# Patient Record
Sex: Male | Born: 1935
Health system: Southern US, Community
[De-identification: ages and names within clinical notes are randomized; demographics above are authoritative.]

## PROBLEM LIST (undated history)

## (undated) DIAGNOSIS — M109 Gout, unspecified: Secondary | ICD-10-CM

## (undated) DIAGNOSIS — I10 Essential (primary) hypertension: Secondary | ICD-10-CM

## (undated) DIAGNOSIS — E785 Hyperlipidemia, unspecified: Secondary | ICD-10-CM

## (undated) HISTORY — DX: Essential (primary) hypertension: I10

## (undated) HISTORY — DX: Gout, unspecified: M10.9

## (undated) HISTORY — DX: Hyperlipidemia, unspecified: E78.5

## (undated) HISTORY — PX: NO PAST SURGERIES: SHX2092

---

## 2015-02-06 DIAGNOSIS — J189 Pneumonia, unspecified organism: Secondary | ICD-10-CM | POA: Insufficient documentation

## 2015-02-06 DIAGNOSIS — N179 Acute kidney failure, unspecified: Secondary | ICD-10-CM | POA: Insufficient documentation

## 2016-09-28 DIAGNOSIS — N183 Chronic kidney disease, stage 3 unspecified: Secondary | ICD-10-CM | POA: Insufficient documentation

## 2016-09-28 DIAGNOSIS — T24232A Burn of second degree of left lower leg, initial encounter: Secondary | ICD-10-CM | POA: Insufficient documentation

## 2016-09-28 DIAGNOSIS — N179 Acute kidney failure, unspecified: Secondary | ICD-10-CM | POA: Insufficient documentation

## 2016-09-28 DIAGNOSIS — T24212A Burn of second degree of left thigh, initial encounter: Secondary | ICD-10-CM | POA: Insufficient documentation

## 2017-11-23 ENCOUNTER — Ambulatory Visit (INDEPENDENT_AMBULATORY_CARE_PROVIDER_SITE_OTHER): Payer: Medicare Other | Admitting: Family Medicine

## 2017-11-23 ENCOUNTER — Other Ambulatory Visit: Payer: Self-pay | Admitting: Family Medicine

## 2017-11-23 ENCOUNTER — Encounter: Payer: Self-pay | Admitting: Family Medicine

## 2017-11-23 VITALS — BP 123/62 | HR 62 | Temp 98.6°F | Resp 16 | Ht 67.0 in | Wt 236.0 lb

## 2017-11-23 DIAGNOSIS — E785 Hyperlipidemia, unspecified: Secondary | ICD-10-CM | POA: Diagnosis not present

## 2017-11-23 DIAGNOSIS — I10 Essential (primary) hypertension: Secondary | ICD-10-CM

## 2017-11-23 DIAGNOSIS — M1A0610 Idiopathic chronic gout, right knee, without tophus (tophi): Secondary | ICD-10-CM | POA: Diagnosis not present

## 2017-11-23 DIAGNOSIS — N183 Chronic kidney disease, stage 3 unspecified: Secondary | ICD-10-CM | POA: Insufficient documentation

## 2017-11-23 DIAGNOSIS — Z7689 Persons encountering health services in other specified circumstances: Secondary | ICD-10-CM

## 2017-11-23 DIAGNOSIS — Z0000 Encounter for general adult medical examination without abnormal findings: Secondary | ICD-10-CM

## 2017-11-23 DIAGNOSIS — I129 Hypertensive chronic kidney disease with stage 1 through stage 4 chronic kidney disease, or unspecified chronic kidney disease: Secondary | ICD-10-CM | POA: Insufficient documentation

## 2017-11-23 DIAGNOSIS — M109 Gout, unspecified: Secondary | ICD-10-CM | POA: Insufficient documentation

## 2017-11-23 DIAGNOSIS — R351 Nocturia: Secondary | ICD-10-CM

## 2017-11-23 NOTE — Assessment & Plan Note (Signed)
Well-controlled HTN - Home BP readings none available  No known complications    Plan:  1. Continue current BP regimen - Lisinopril 40mg , Atenolol 50mg , Amlodipine 5mg  daily 2. Encourage improved lifestyle - low sodium diet, regular exercise as tolerated 3. Continue monitor BP outside office, bring readings to next visit, if persistently >140/90 or new symptoms notify office sooner 4. Follow-up in 3 months annual + labs, review records  Future consider switch ACEi to ARB to reduce gout flare risk w/ losartan

## 2017-11-23 NOTE — Progress Notes (Signed)
Subjective:    Patient ID: Robert [name], male    DOB: 12/23/1931, 84 y.o.   MRN: [id number]  Robert [name] is a 84 y.o. male presenting on 11/23/2017 for Establish Care (knee pain stiff painful ROM Right is worst )  Previous PCP at [facility] for 3-4 years previously, then saw [facility] in [place] but then not covered w/ insurance and now here to establish care w/ new PCP   HPI   CHRONIC HTN: Reports no concerns, has BP checked at home occasionally, cousin checks it Current Meds - Atenolol 50mg , Amlodpine 5mg , Lisinopril 40mg    Reports good compliance, took meds today. Tolerating well, w/o complaints.  Bilateral Gout Arthritis - Chronic problem for years, >5-10 years possibly, gout in both knees, R worse than Left knee. He has some chronic pain but is able to tolerate it and stay active. He is retired from work as Scientist, forensic and work in [employer] He does not use a cane or walker for assistance, but admits has fallen x 2 in past 1-2 years. He can tolerate NSAIDs PRN. Has not had recent gout flare. Tolerating Allopurinol 300mg  daily with good results of preventing flares. He has not had any x-rays of R knee or either knee by his report. Difficulty in finding knee brace or sleeve, tried in past but had problems with compression  PMH - Hyperlipidemia, Obesity (Morbid based on risk factors)  Do not have any recent lab results available.  Health Maintenance: Due for Tdap and Pneumonia vaccine series, unless already received at prior PCP, request record  Depression screen [facility] 2/9 11/23/2017  Decreased Interest 0  Down, Depressed, Hopeless 0  PHQ - 2 Score 0    Past Medical History:  Diagnosis Date  . Gout   . Hyperlipidemia   . Hypertension    History reviewed. No pertinent surgical history. Social History   Socioeconomic History  . Marital status: Widowed    Spouse name: Not on file  . Number of children: Not on file  . Years of education: Not on file  . Highest education  level: Not on file  Occupational History  . Not on file  Social Needs  . Financial resource strain: Not on file  . Food insecurity:    Worry: Not on file    Inability: Not on file  . Transportation needs:    Medical: Not on file    Non-medical: Not on file  Tobacco Use  . Smoking status: Former Research scientist (life sciences)  . Smokeless tobacco: Former Network engineer and Sexual Activity  . Alcohol use: Never    Frequency: Never  . Drug use: Never  . Sexual activity: Not on file  Lifestyle  . Physical activity:    Days per week: Not on file    Minutes per session: Not on file  . Stress: Not on file  Relationships  . Social connections:    Talks on phone: Not on file    Gets together: Not on file    Attends religious service: Not on file    Active member of club or organization: Not on file    Attends meetings of clubs or organizations: Not on file    Relationship status: Not on file  . Intimate partner violence:    Fear of current or ex partner: Not on file    Emotionally abused: Not on file    Physically abused: Not on file    Forced sexual activity: Not on file  Other  Topics Concern  . Not on file  Social History Narrative  . Not on file   History reviewed. No pertinent family history. Current Outpatient Medications on File Prior to Visit  Medication Sig  . allopurinol (ZYLOPRIM) 300 MG tablet Take 300 mg by mouth daily.  Marland Kitchen amLODipine (NORVASC) 5 MG tablet Take 5 mg by mouth daily.  Marland Kitchen atenolol (TENORMIN) 50 MG tablet Take 50 mg by mouth daily.  Marland Kitchen lisinopril (PRINIVIL,ZESTRIL) 40 MG tablet Take 40 mg by mouth daily.   No current facility-administered medications on file prior to visit.     Review of Systems  Constitutional: Negative for activity change, appetite change, chills, diaphoresis, fatigue and fever.  HENT: Negative for congestion and hearing loss.   Eyes: Negative for visual disturbance.  Respiratory: Negative for apnea, cough, choking, chest tightness, shortness of  breath and wheezing.   Cardiovascular: Negative for chest pain, palpitations and leg swelling.  Gastrointestinal: Negative for abdominal pain, anal bleeding, blood in stool, constipation, diarrhea, nausea and vomiting.  Endocrine: Negative for cold intolerance.  Genitourinary: Negative for decreased urine volume, difficulty urinating, dysuria, frequency, hematuria, testicular pain and urgency.  Musculoskeletal: Positive for arthralgias (R knee). Negative for neck pain.  Skin: Negative for rash.  Allergic/Immunologic: Negative for environmental allergies.  Neurological: Negative for dizziness, weakness, light-headedness, numbness and headaches.  Hematological: Negative for adenopathy.  Psychiatric/Behavioral: Negative for behavioral problems, dysphoric mood and sleep disturbance. The patient is not nervous/anxious.    Per HPI unless specifically indicated above     Objective:    BP 123/62   Pulse 62   Temp 98.6 F (37 C) (Oral)   Resp 16   Ht 5\' 7"  (1.702 m)   Wt 236 lb (107 kg)   BMI 36.96 kg/m   Wt Readings from Last 3 Encounters:  11/23/17 236 lb (107 kg)    Physical Exam  Constitutional: He is oriented to person, place, and time. He appears well-developed and well-nourished. No distress.  Well-appearing, comfortable, cooperative, obese  HENT:  Head: Normocephalic and atraumatic.  Mouth/Throat: Oropharynx is clear and moist.  Eyes: Conjunctivae are normal. Right eye exhibits no discharge. Left eye exhibits no discharge.  Neck: Normal range of motion. Neck supple. No thyromegaly present.  Cardiovascular: Normal rate, regular rhythm, normal heart sounds and intact distal pulses.  No murmur heard. Pulmonary/Chest: Effort normal and breath sounds normal. No respiratory distress. He has no wheezes. He has no rales.  Musculoskeletal: He exhibits no edema.  Bilateral Knees Inspection: Mild bulky appearance R>L. No ecchymosis or effusion. Palpation: Non-tender. Significant  crepitus R>L ROM: Slightly limited active ROM R flex/ext Strength: 5/5 intact knee flex/ext, ankle dorsi/plantarflex Neurovascular: distally intact sensation light touch and pulses  Lymphadenopathy:    He has no cervical adenopathy.  Neurological: He is alert and oriented to person, place, and time.  Skin: Skin is warm and dry. No rash noted. He is not diaphoretic. No erythema.  Psychiatric: He has a normal mood and affect. His behavior is normal.  Well groomed, good eye contact, normal speech and thoughts  Nursing note and vitals reviewed.  No results found for this or any previous visit.    Assessment & Plan:   Problem List Items Addressed This Visit    Gout    Stable w/o acute flare. Seems complicated by Chronic Gouty Arthritis with chronic changes of knees. Limit his ambulation at times due to chronic pain and stiff Known chronic gout in R knee > L - Currently  on Allopurinol 300mg  daily - Last uric acid level not available  Plan: 1. Continue Allopurinol 300mg  daily for prophylaxis - refill - Avoid food triggers (red meat, alcohol) - Follow-up as planned 3 months labs include uric acid level, future may need x-rays for baseline, if flare return precautions, consider NSAID vs Prednisone  Completed handicap placard form for permanent - up to 5 years, cannot walk up to 200 ft w/o rest, and limited by orthopedic condition      Hyperlipidemia    Due for lipids at next visit Not on statin therapy currently Review based on ASCVD risk calc      Relevant Medications   lisinopril (PRINIVIL,ZESTRIL) 40 MG tablet   atenolol (TENORMIN) 50 MG tablet   amLODipine (NORVASC) 5 MG tablet   Hypertension - Primary    Well-controlled HTN - Home BP readings none available  No known complications    Plan:  1. Continue current BP regimen - Lisinopril 40mg , Atenolol 50mg , Amlodipine 5mg  daily 2. Encourage improved lifestyle - low sodium diet, regular exercise as tolerated 3. Continue  monitor BP outside office, bring readings to next visit, if persistently >140/90 or new symptoms notify office sooner 4. Follow-up in 3 months annual + labs, review records  Future consider switch ACEi to ARB to reduce gout flare risk w/ losartan      Relevant Medications   lisinopril (PRINIVIL,ZESTRIL) 40 MG tablet   atenolol (TENORMIN) 50 MG tablet   amLODipine (NORVASC) 5 MG tablet   Morbid obesity ([place])    Considered morbid obesity w/ BMI >36 (not >40) due to co morbid conditions/risk factors with Hyperlipidemia, Hypertension and chronic gout  Counseling on lifestyle, diet exercise improve goal wt loss       Other Visit Diagnoses    Encounter to establish care with new doctor       Request records from prior PCP [facility]      No orders of the defined types were placed in this encounter.  Follow up plan: Return in about 3 months (around 02/22/2018) for Annual Physical.  Future labs ordered for 02/28/18  Nobie Putnam, [facility] 11/23/2017, 10:22 PM

## 2017-11-23 NOTE — Assessment & Plan Note (Signed)
Due for lipids at next visit Not on statin therapy currently Review based on ASCVD risk calc

## 2017-11-23 NOTE — Assessment & Plan Note (Signed)
Considered morbid obesity w/ BMI >36 (not >40) due to co morbid conditions/risk factors with Hyperlipidemia, Hypertension and chronic gout  Counseling on lifestyle, diet exercise improve goal wt loss 

## 2017-11-23 NOTE — Assessment & Plan Note (Addendum)
Stable w/o acute flare. Seems complicated by Chronic Gouty Arthritis with chronic changes of knees. Limit his ambulation at times due to chronic pain and stiff Known chronic gout in R knee > L - Currently on Allopurinol 300mg  daily - Last uric acid level not available  Plan: 1. Continue Allopurinol 300mg  daily for prophylaxis - refill - Avoid food triggers (red meat, alcohol) - Follow-up as planned 3 months labs include uric acid level, future may need x-rays for baseline, if flare return precautions, consider NSAID vs Prednisone  Completed handicap placard form for permanent - up to 5 years, cannot walk up to 200 ft w/o rest, and limited by orthopedic condition

## 2017-11-23 NOTE — Patient Instructions (Addendum)
Thank you for coming to the office today.  Keep taking current BP medications, Bp is good today well controlled.  Continue Allopurinol for gout.  Recommend to start taking Tylenol Extra Strength 500mg  tabs - take 1 to 2 tabs per dose (max 1000mg ) every 6-8 hours for pain (take regularly, don't skip a dose for next 7 days), max 24 hour daily dose is 6 tablets or 3000mg . In the future you can repeat the same everyday Tylenol course for 1-2 weeks at a time.   Only use anti inflammatory Advil or Ibuprofen or Aleve if flare -up  ------------------------------------------  [facility] [place], [place] [address] [address] [place] [address] [place] Phone: [phone]  [place], [place] [address] Ph: [phone]  DUE for [place] (no food or drink after midnight before the lab appointment, only water or coffee without cream/sugar on the morning of)  SCHEDULE "Lab Only" visit in the morning at the clinic for lab draw in 3 MONTHS   - Make sure Lab Only appointment is at about 1 week before your next appointment, so that results will be available  For Lab Results, once available within 2-3 days of blood draw, you can can log in to MyChart online to view your results and a brief explanation. Also, we can discuss results at next follow-up visit.   Please schedule a Follow-up Appointment to: Return in about 3 months (around 02/22/2018) for Annual Physical.  If you have any other questions or concerns, please feel free to call the office or send a message through [place]. You may also schedule an earlier appointment if necessary.  Additionally, you may be receiving a survey about your experience at our office within a few days to 1 week by e-mail or mail. We value your feedback.  Nobie Putnam, DO [place]

## 2018-01-26 DIAGNOSIS — R0982 Postnasal drip: Secondary | ICD-10-CM | POA: Diagnosis not present

## 2018-01-26 DIAGNOSIS — J069 Acute upper respiratory infection, unspecified: Secondary | ICD-10-CM | POA: Diagnosis not present

## 2018-02-11 ENCOUNTER — Ambulatory Visit (INDEPENDENT_AMBULATORY_CARE_PROVIDER_SITE_OTHER): Payer: Medicare Other | Admitting: Family Medicine

## 2018-02-11 ENCOUNTER — Ambulatory Visit: Payer: Self-pay | Admitting: Family Medicine

## 2018-02-11 ENCOUNTER — Encounter: Payer: Self-pay | Admitting: Family Medicine

## 2018-02-11 VITALS — BP 129/88 | HR 70 | Temp 98.3°F | Resp 16 | Ht 67.0 in | Wt 226.0 lb

## 2018-02-11 DIAGNOSIS — J209 Acute bronchitis, unspecified: Secondary | ICD-10-CM

## 2018-02-11 MED ORDER — LEVOFLOXACIN 500 MG PO TABS: 500.0000 mg | ORAL_TABLET | Freq: Every day | ORAL | 0 refills | Status: DC

## 2018-02-11 NOTE — Progress Notes (Signed)
Subjective:    Patient ID: Robert [name], male    DOB: 11/04/1931, 84 y.o.   MRN: [id number]  Robert [name] is a 84 y.o. male presenting on 02/11/2018 for Cough (onset 2 week chest congestion)  Patient presents for a same day appointment. History provided by patient and granddaughter.  HPI   COUGH / Bronchitis Reports symptoms started with more sinus symptoms and URI and then progressive worsening over 2 weeks after onset. Now with concern with cough not improved. He was seen at local Urgent Care 1 week ago given Augmentin and Tessalon without improvement. He is a former smoker but no history of COPD or asthma. Has not been on prednisone or albuterol before for breathing. He said Robert [name] is making cough more. - Taking some Mucinex OTC - Sick contact with brother having similar symptoms was dx with Pneumonia - Admits persistent productive cough, sometimes too thick mucus to and unable to get it cleared - Admits occasional short of breath due to coughing, worse at night - Denies fevers chills sweats, chest pain, nausea vomiting rash, headache, dizziness lightheaded   Depression screen [facility] 2/9 02/11/2018 11/23/2017  Decreased Interest 0 0  Down, Depressed, Hopeless 0 0  PHQ - 2 Score 0 0    Social History   Tobacco Use  . Smoking status: Former Research scientist (life sciences)  . Smokeless tobacco: Former Network engineer Use Topics  . Alcohol use: Never    Frequency: Never  . Drug use: Never    Review of Systems Per HPI unless specifically indicated above     Objective:    BP 129/88   Pulse 70   Temp 98.3 F (36.8 C) (Oral)   Resp 16   Ht 5\' 7"  (1.702 m)   Wt 226 lb (102.5 kg)   SpO2 98%   BMI 35.40 kg/m   Wt Readings from Last 3 Encounters:  02/11/18 226 lb (102.5 kg)  11/23/17 236 lb (107 kg)    Physical Exam  Constitutional: He is oriented to person, place, and time. He appears well-developed and well-nourished. No distress.  Well-appearing, comfortable, cooperative  HENT:  Head:  Normocephalic and atraumatic.  Mouth/Throat: Oropharynx is clear and moist.  Frontal / maxillary sinuses non-tender. Nares patent without purulence or edema. Bilateral TMs clear without erythema, effusion or bulging. Oropharynx clear without erythema, exudates, edema or asymmetry.  Eyes: Conjunctivae are normal. Right eye exhibits no discharge. Left eye exhibits no discharge.  Neck: Neck supple.  Cardiovascular: Normal rate, regular rhythm, normal heart sounds and intact distal pulses.  No murmur heard. Pulmonary/Chest: Effort normal. No respiratory distress. He has no wheezes. He has no rales.  Occasional cough. Good air movement overall, some slight reduced at bases. Non focal no crackles or wheezing.  Musculoskeletal: Normal range of motion. He exhibits no edema.  Neurological: He is alert and oriented to person, place, and time.  Skin: Skin is warm and dry. No rash noted. He is not diaphoretic. No erythema.  Psychiatric: He has a normal mood and affect. His behavior is normal.  Well groomed, good eye contact, normal speech and thoughts  Nursing note and vitals reviewed.  No results found for this or any previous visit.    Assessment & Plan:   Problem List Items Addressed This Visit    None    Visit Diagnoses    Acute bronchitis with bronchospasm    -  Primary   Relevant Medications   levofloxacin (LEVAQUIN) 500 MG tablet  Consistent with worsening bronchitis in setting of likely viral URI (+sick contacts) Concern with duration >2 week - Afebrile, no focal signs of infection (not consistent with pneumonia by history or exam), with mixed sinus symptoms but not characteristic of bacterial sinusitis at this time. - POx 98% on RA - Concern some reduced air movement and night-time cough (without history of COPD, but former smoker)  [employer] to get name of recent antibiotic, he did not bring and we did not have record - s/p Augmentin course  Plan: 1. Offered chest x-ray  - mutual agreement to defer for now - can return within 2-3 days if not improved for Chest X-ray - Start empiric Levaquin 500mg  daily x 7 day antibiotic - Future may add Flonase or nasal spray - May finish Tessalon if helping - May try OTC Mucinex (or may try Mucinex-DM for cough) up to 7-10 days then stop - sample given Return criteria reviewed, follow-up within 1 week if not improved - would offer CXR, Prednisone   Meds ordered this encounter  Medications  . levofloxacin (LEVAQUIN) 500 MG tablet    Sig: Take 1 tablet (500 mg total) by mouth daily. For 7 days    Dispense:  7 tablet    Refill:  0    Follow up plan: Return in about 1 week (around 02/18/2018), or if symptoms worsen or fail to improve, for bronchitis / cough.  Robert [name], [facility] 02/11/2018, 11:59 AM

## 2018-02-11 NOTE — Patient Instructions (Addendum)
Thank you for coming to the office today.  1. It sounds like you had an Upper Respiratory Virus that has settled into a Bronchitis, lower respiratory tract infection, and think that this should gradually improve. Once you are feeling better, the cough may take a few weeks to fully resolve. Possibly to have a deeper infection or pneumonia we offered Chest X-ray but this may be done if not better within next 2-3 days.  Start taking Levaquin antibiotic 500mg  daily x 7 days  If not improved we will offer Chest X-ray and Prednisone for cough  Continue OTC Mucinex (or may try Mucinex-DM for cough) up to 7-10 days then stop  - Use nasal saline (Simply Saline or Ocean Spray) to flush nasal congestion multiple times a day, may help cough - Drink plenty of fluids to improve congestion  If your symptoms seem to worsen instead of improve over next several days, including significant fever / chills, worsening shortness of breath, worsening wheezing, or nausea / vomiting and can't take medicines - return sooner or go to hospital Emergency Department for more immediate treatment.   Please schedule a Follow-up Appointment to: Return in about 1 week (around 02/18/2018), or if symptoms worsen or fail to improve, for bronchitis / cough.  If you have any other questions or concerns, please feel free to call the office or send a message through [place]. You may also schedule an earlier appointment if necessary.  Additionally, you may be receiving a survey about your experience at our office within a few days to 1 week by e-mail or mail. We value your feedback.  Nobie Putnam, DO [place]

## 2018-02-22 ENCOUNTER — Ambulatory Visit
Admission: RE | Admit: 2018-02-22 | Discharge: 2018-02-22 | Disposition: A | Discharge status: Home / Self Care | Payer: Medicare Other | Source: Ambulatory Visit | Attending: Family Medicine | Admitting: Family Medicine

## 2018-02-22 ENCOUNTER — Encounter: Payer: Self-pay | Admitting: Family Medicine

## 2018-02-22 ENCOUNTER — Other Ambulatory Visit: Payer: Self-pay

## 2018-02-22 ENCOUNTER — Ambulatory Visit (INDEPENDENT_AMBULATORY_CARE_PROVIDER_SITE_OTHER): Payer: Medicare Other | Admitting: Family Medicine

## 2018-02-22 VITALS — BP 114/67 | HR 65 | Temp 98.3°F | Resp 16 | Ht 67.0 in | Wt 223.2 lb

## 2018-02-22 DIAGNOSIS — J984 Other disorders of lung: Secondary | ICD-10-CM | POA: Diagnosis not present

## 2018-02-22 DIAGNOSIS — I700 Atherosclerosis of aorta: Secondary | ICD-10-CM | POA: Insufficient documentation

## 2018-02-22 DIAGNOSIS — J209 Acute bronchitis, unspecified: Secondary | ICD-10-CM

## 2018-02-22 DIAGNOSIS — J00 Acute nasopharyngitis [common cold]: Secondary | ICD-10-CM

## 2018-02-22 DIAGNOSIS — R05 Cough: Secondary | ICD-10-CM | POA: Diagnosis not present

## 2018-02-22 MED ORDER — FLUTICASONE PROPIONATE 50 MCG/ACT NA SUSP: 2.0000 | Freq: Every day | NASAL | 3 refills | Status: DC

## 2018-02-22 NOTE — Patient Instructions (Addendum)
Thank you for coming to the office today.  Chest X-ray today - will let you know by end of day or first thing Monday with results.  Most likely still lingering cough from sinus drainage or other bronchitis - however no more antibiotics at this time.  Start nasal steroid Flonase 2 sprays in each nostril daily for 4-6 weeks, may repeat course seasonally or as needed  Please schedule a Follow-up Appointment to: Return if symptoms worsen or fail to improve, for cough - keep apt as scheduled next 1-2 weeks.  If you have any other questions or concerns, please feel free to call the office or send a message through [place]. You may also schedule an earlier appointment if necessary.  Additionally, you may be receiving a survey about your experience at our office within a few days to 1 week by e-mail or mail. We value your feedback.  Nobie Putnam, DO [place]

## 2018-02-22 NOTE — Progress Notes (Signed)
Subjective:    Patient ID: Robert [name], male    DOB: [birth year], 84 y.o.   MRN: [id number]  Robert [name] is a 84 y.o. male presenting on 02/22/2018 for Cough (persistent cough. Pt recently treated for bronchitis x 11 days ago )   Patient presents for a same day appointment. History provided by patient and granddaughter.   HPI  FOLLOW-UP Bronchitis - Lingering Cough - Last visit with me 02/11/18, for initial visit for same problem with cough, treated with empiric Levaquin 500mg  daily x 7 days given history of concern for pneumonia in the past but he was not consistent with this at the presentation, rx also tessalon, mucinex, CXR was deferred at that time, see prior notes for background information. - Interval update with significant improvement on antibiotic and overall felt much better, but has had lingering cough still, now 10 days after initial treatment - Today patient reports he feels occasional episodes of coughing, worse with outdoor exposure and hot weather, sometimes if he is active will have cough, occasionally productive otherwise not always. He tried tessalon but it did not help. He did not start Flonase or other nose spray. - Finished Mucinex - History of lisinopril 40mg  daily, no known dry cough in past due to ACEi. Does not seem consistent. - Denies any dyspnea, fevers chills sweats, chest pain, nausea vomiting rash, headache, dizziness lightheaded   Depression screen [facility] 2/9 02/11/2018 11/23/2017  Decreased Interest 0 0  Down, Depressed, Hopeless 0 0  PHQ - 2 Score 0 0    Social History   Tobacco Use  . Smoking status: Former Research scientist (life sciences)  . Smokeless tobacco: Former Network engineer Use Topics  . Alcohol use: Never    Frequency: Never  . Drug use: Never    Review of Systems Per HPI unless specifically indicated above     Objective:    BP 114/67 (BP Location: Right Arm, Patient Position: Sitting, Cuff Size: Normal)   Pulse 65   Temp 98.3 F (36.8 C) (Oral)   Resp  16   Ht 5\' 7"  (1.702 m)   Wt 223 lb 3.2 oz (101.2 kg)   SpO2 96%   BMI 34.96 kg/m   Wt Readings from Last 3 Encounters:  02/22/18 223 lb 3.2 oz (101.2 kg)  02/11/18 226 lb (102.5 kg)  11/23/17 236 lb (107 kg)    Physical Exam  Constitutional: He is oriented to person, place, and time. He appears well-developed and well-nourished. No distress.  Well-appearing, comfortable, cooperative  HENT:  Head: Normocephalic and atraumatic.  Mouth/Throat: Oropharynx is clear and moist.  Frontal / maxillary sinuses non-tender. Oropharynx clear without erythema, exudates, edema or asymmetry.  Eyes: Conjunctivae are normal. Right eye exhibits no discharge. Left eye exhibits no discharge.  Neck: Neck supple.  Cardiovascular: Normal rate, regular rhythm, normal heart sounds and intact distal pulses.  No murmur heard. Pulmonary/Chest: Effort normal. No respiratory distress. He has no wheezes. He has no rales.  No obvious coughing during visit. Good air movement overall, still some slightly diminished air movement R > L lung base with mild rhonchi that clears somewhat but not completely with cough. No focal no crackles or wheezing.  Musculoskeletal: Normal range of motion. He exhibits no edema.  Neurological: He is alert and oriented to person, place, and time.  Skin: Skin is warm and dry. No rash noted. He is not diaphoretic. No erythema.  Psychiatric: He has a normal mood and affect. His behavior is normal.  Well  groomed, good eye contact, normal speech and thoughts  Nursing note and vitals reviewed.  No results found for this or any previous visit.    Assessment & Plan:   Problem List Items Addressed This Visit    None    Visit Diagnoses    Acute bronchitis with bronchospasm    -  Primary Now significantly improved, with resolved bronchospasm and bronchitis Clinically seems to be more post cough syndrome may be related to allergic rhinitis now or may just take longer to resolve on it's own  after treatment - Patient and family are still requesting chest x-ray, I would support this given still lower L lung field with some rhonchi and slight reduced air movement  Plan No further antibiotics. Stop Tessalon Check Chest X-ray routine order today - will f/u results with patient later today or Monday as advised Start nasal steroid Flonase 2 sprays in each nostril daily for 4-6 weeks, may repeat course seasonally or as needed - Follow-up as planned in 1-2 weeks for annual and labs, will determine if improving at that time  Future may consider to Switch Lisinopril if we think ACEi cough is potential    Relevant Orders   DG Chest 2 View   Acute rhinitis       Relevant Medications   fluticasone (FLONASE) 50 MCG/ACT nasal spray         Meds ordered this encounter  Medications  . fluticasone (FLONASE) 50 MCG/ACT nasal spray    Sig: Place 2 sprays into both nostrils daily. Use for 4-6 weeks then stop and use seasonally or as needed.    Dispense:  16 g    Refill:  3    Follow up plan: Return if symptoms worsen or fail to improve, for cough - keep apt as scheduled next 1-2 weeks.  Future orders were already in system for upcoming lab and physical visits.  Nobie Putnam, [facility] 02/22/2018, 3:11 PM

## 2018-02-25 ENCOUNTER — Telehealth: Payer: Self-pay

## 2018-02-27 NOTE — Telephone Encounter (Signed)
Attempted to contact the pt, no answer. LMOM to return my call.  

## 2018-02-28 ENCOUNTER — Other Ambulatory Visit: Payer: Medicare Other

## 2018-02-28 DIAGNOSIS — R351 Nocturia: Secondary | ICD-10-CM

## 2018-02-28 DIAGNOSIS — R7303 Prediabetes: Secondary | ICD-10-CM | POA: Diagnosis not present

## 2018-02-28 DIAGNOSIS — I10 Essential (primary) hypertension: Secondary | ICD-10-CM | POA: Diagnosis not present

## 2018-02-28 DIAGNOSIS — Z0000 Encounter for general adult medical examination without abnormal findings: Secondary | ICD-10-CM

## 2018-02-28 DIAGNOSIS — E785 Hyperlipidemia, unspecified: Secondary | ICD-10-CM | POA: Diagnosis not present

## 2018-02-28 DIAGNOSIS — M1A0610 Idiopathic chronic gout, right knee, without tophus (tophi): Secondary | ICD-10-CM

## 2018-03-01 ENCOUNTER — Encounter: Payer: Self-pay | Admitting: Family Medicine

## 2018-03-01 DIAGNOSIS — N183 Chronic kidney disease, stage 3 unspecified: Secondary | ICD-10-CM | POA: Insufficient documentation

## 2018-03-01 DIAGNOSIS — R7303 Prediabetes: Secondary | ICD-10-CM | POA: Insufficient documentation

## 2018-03-01 LAB — CBC WITH DIFFERENTIAL/PLATELET
Basophils Absolute: 129 cells/uL (ref 0–200)
Basophils Relative: 0.8 %
Eosinophils Absolute: 354 cells/uL (ref 15–500)
Eosinophils Relative: 2.2 %
HCT: 40 % (ref 38.5–50.0)
Hemoglobin: 12.7 g/dL — ABNORMAL LOW (ref 13.2–17.1)
Lymphs Abs: 9563 cells/uL — ABNORMAL HIGH (ref 850–3900)
MCH: 23 pg — ABNORMAL LOW (ref 27.0–33.0)
MCHC: 31.8 g/dL — ABNORMAL LOW (ref 32.0–36.0)
MCV: 72.5 fL — ABNORMAL LOW (ref 80.0–100.0)
Monocytes Relative: 11 %
Neutro Abs: 4283 cells/uL (ref 1500–7800)
Neutrophils Relative %: 26.6 %
Platelets: 227 10*3/uL (ref 140–400)
RBC: 5.52 10*6/uL (ref 4.20–5.80)
RDW: 19.3 % — ABNORMAL HIGH (ref 11.0–15.0)
Total Lymphocyte: 59.4 %
WBC mixed population: 1771 cells/uL — ABNORMAL HIGH (ref 200–950)
WBC: 16.1 10*3/uL — ABNORMAL HIGH (ref 3.8–10.8)

## 2018-03-01 LAB — LIPID PANEL
Cholesterol: 168 mg/dL (ref <200)
HDL: 40 mg/dL — ABNORMAL LOW (ref >40)
LDL Cholesterol (Calc): 109 mg/dL (calc) — ABNORMAL HIGH
Non-HDL Cholesterol (Calc): 128 mg/dL (calc) (ref <130)
Total CHOL/HDL Ratio: 4.2 (calc) (ref <5.0)
Triglycerides: 95 mg/dL (ref <150)

## 2018-03-01 LAB — COMPLETE METABOLIC PANEL WITH GFR
AG Ratio: 1.8 (calc) (ref 1.0–2.5)
ALT: 10 U/L (ref 9–46)
AST: 18 U/L (ref 10–35)
Albumin: 4.4 g/dL (ref 3.6–5.1)
Alkaline phosphatase (APISO): 64 U/L (ref 40–115)
BUN/Creatinine Ratio: 15 (calc) (ref 6–22)
BUN: 19 mg/dL (ref 7–25)
CO2: 27 mmol/L (ref 20–32)
Calcium: 9.3 mg/dL (ref 8.6–10.3)
Chloride: 105 mmol/L (ref 98–110)
Creat: 1.28 mg/dL — ABNORMAL HIGH (ref 0.70–1.11)
GFR, Est African American: 59 mL/min/{1.73_m2} — ABNORMAL LOW (ref >=60)
GFR, Est Non African American: 51 mL/min/{1.73_m2} — ABNORMAL LOW (ref >=60)
Globulin: 2.4 g/dL (calc) (ref 1.9–3.7)
Glucose, Bld: 112 mg/dL — ABNORMAL HIGH (ref 65–99)
Potassium: 5.1 mmol/L (ref 3.5–5.3)
Sodium: 140 mmol/L (ref 135–146)
Total Bilirubin: 0.5 mg/dL (ref 0.2–1.2)
Total Protein: 6.8 g/dL (ref 6.1–8.1)

## 2018-03-01 LAB — URIC ACID: Uric Acid, Serum: 5.1 mg/dL (ref 4.0–8.0)

## 2018-03-01 LAB — HEMOGLOBIN A1C
Hgb A1c MFr Bld: 6.3 % of total Hgb — ABNORMAL HIGH (ref <5.7)
Mean Plasma Glucose: 134 (calc)
eAG (mmol/L): 7.4 (calc)

## 2018-03-01 LAB — PSA, TOTAL WITH REFLEX TO PSA, FREE: PSA, Total: 0.5 ng/mL (ref <=4.0)

## 2018-03-05 ENCOUNTER — Ambulatory Visit (INDEPENDENT_AMBULATORY_CARE_PROVIDER_SITE_OTHER): Payer: Medicare Other | Admitting: Family Medicine

## 2018-03-05 ENCOUNTER — Encounter: Payer: Self-pay | Admitting: Family Medicine

## 2018-03-05 VITALS — BP 124/60 | HR 60 | Temp 97.9°F | Resp 16 | Ht 67.0 in | Wt 222.6 lb

## 2018-03-05 DIAGNOSIS — R7303 Prediabetes: Secondary | ICD-10-CM | POA: Diagnosis not present

## 2018-03-05 DIAGNOSIS — E7800 Pure hypercholesterolemia, unspecified: Secondary | ICD-10-CM

## 2018-03-05 DIAGNOSIS — I10 Essential (primary) hypertension: Secondary | ICD-10-CM

## 2018-03-05 DIAGNOSIS — Z23 Encounter for immunization: Secondary | ICD-10-CM

## 2018-03-05 DIAGNOSIS — N183 Chronic kidney disease, stage 3 unspecified: Secondary | ICD-10-CM

## 2018-03-05 DIAGNOSIS — Z0000 Encounter for general adult medical examination without abnormal findings: Secondary | ICD-10-CM | POA: Diagnosis not present

## 2018-03-05 DIAGNOSIS — M150 Primary generalized (osteo)arthritis: Secondary | ICD-10-CM

## 2018-03-05 DIAGNOSIS — M159 Polyosteoarthritis, unspecified: Secondary | ICD-10-CM | POA: Insufficient documentation

## 2018-03-05 DIAGNOSIS — M8949 Other hypertrophic osteoarthropathy, multiple sites: Secondary | ICD-10-CM

## 2018-03-05 DIAGNOSIS — M1A0610 Idiopathic chronic gout, right knee, without tophus (tophi): Secondary | ICD-10-CM

## 2018-03-05 NOTE — Assessment & Plan Note (Signed)
Concern elevated A1c to 6.3 Concern with obesity, HTN, HLD  Plan:  1. Not on any therapy currently - defer meds for now - in future if need we can consider options 2. Encourage improved lifestyle - low carb, low sugar diet, reduce portion size, continue improving regular exercise 3. Follow-up A1c q 3-6 months

## 2018-03-05 NOTE — Assessment & Plan Note (Signed)
See A&P gout, likely affected chronic knee pain and joint dysfunction - Next visit consider X-rays of knees, injection and other therapy

## 2018-03-05 NOTE — Assessment & Plan Note (Signed)
Stable CKD-III With mild elevated Cr 1.2 to 1.3 Remain off oral NSAIDs Improve hydration Control BP and sugar, concern elevated A1c Follow-up as planned monitor Cr

## 2018-03-05 NOTE — Progress Notes (Signed)
Subjective:    Patient ID: Robert [name], male    DOB: 1931-11-27, 84 y.o.   MRN: [id number]  Robert [name] is a 84 y.o. male presenting on 03/05/2018 for Annual Exam  History provided by patient and granddaughter.  HPI   Here for Annual Physical and Lab Review  Follow-up Cough / Bronchitis Recently seen 02/11/18 and 02/22/18 for same problem, see prior note for background. Last visit given Flonase, and he has completed prior levaquin rx. He still has some residual cough. He had CXR that showed some reactive airway thickening this could be bronchitis or other. Today he still has some lingering cough, not worsening. He is comfortable with current treatment. No new symptoms. Lab recently showed elevated WBC Denies any fevers chills wheezing or dyspnea  CHRONIC HTN: Reports no concerns, has BP checked at home occasionally, cousin checks it - normal last time Current Meds - Atenolol 50mg , Amlodpine 5mg , Lisinopril 40mg    Reports good compliance, took meds today. Tolerating well, w/o complaints.  Pre-Diabetes Reports no new concerns. Last A1c 6.3 on 02/28/18 Meds: Not on medicine for sugar. Currently on ACEi Lifestyle: - Diet (improving diet, limiting starches and sugars best he can)  - Exercise (staying active, less exercise) Denies hypoglycemia  Mild Anemia, Microcytic Recent lab result showed Hemoglobin 12.7 slightly lower. Prior results available are similar readings for past >3+ years with Hgb mild low 12-13 range, with lower MCV. He has not taken iron supplement. Does not endorse fatigue or tiredness. He has no bleeding, dark stools or other concerns.  Bilateral Gout Arthritis - Chronic problem for years, >5-10 years possibly, gout in both knees, R worse than Left knee. He has some chronic pain but is able to tolerate it and stay active. He is retired from work as Scientist, forensic and work in [employer] He does not use a cane or walker for assistance, but admits has fallen x 2 in past 1-2  years. He can tolerate NSAIDs PRN. Has not had recent gout flare. Tolerating Allopurinol 300mg  daily with good results of preventing flares. He has not had any x-rays of R knee or either knee by his report. Difficulty in finding knee brace or sleeve, tried in past but had problems with compression - Last update with Uric Acid 5.1, recently checked normal range  HYPERLIPIDEMIA: - Reports no concerns. Last lipid panel 02/2018, mostly controlled slightly elevated LDL 109 - Not on statin therapy   Health Maintenance:  Prostate CA Screening: Prior PSA / DRE reported normal. Last PSA 0.5 (02/2018). Currently asymptomatic. No known family history of prostate CA.   Due for 2nd pneumonia vaccine >1 year after previous vaccine, now to receive Pneumovax-23 today - then UTD already received Prevnar-13  UTD TDap   Depression screen [facility] 2/9 03/05/2018 02/11/2018 11/23/2017  Decreased Interest 0 0 0  Down, Depressed, Hopeless 0 0 0  PHQ - 2 Score 0 0 0    Past Medical History:  Diagnosis Date  . Gout   . Hyperlipidemia   . Hypertension    No past surgical history on file. Social History   Socioeconomic History  . Marital status: Widowed    Spouse name: Not on file  . Number of children: Not on file  . Years of education: Not on file  . Highest education level: Not on file  Occupational History  . Not on file  Social Needs  . Financial resource strain: Not on file  . Food insecurity:    Worry: Not  on file    Inability: Not on file  . Transportation needs:    Medical: Not on file    Non-medical: Not on file  Tobacco Use  . Smoking status: Former Research scientist (life sciences)  . Smokeless tobacco: Former Network engineer and Sexual Activity  . Alcohol use: Never    Frequency: Never  . Drug use: Never  . Sexual activity: Not on file  Lifestyle  . Physical activity:    Days per week: Not on file    Minutes per session: Not on file  . Stress: Not on file  Relationships  . Social connections:    Talks  on phone: Not on file    Gets together: Not on file    Attends religious service: Not on file    Active member of club or organization: Not on file    Attends meetings of clubs or organizations: Not on file    Relationship status: Not on file  . Intimate partner violence:    Fear of current or ex partner: Not on file    Emotionally abused: Not on file    Physically abused: Not on file    Forced sexual activity: Not on file  Other Topics Concern  . Not on file  Social History Narrative  . Not on file   No family history on file. Current Outpatient Medications on File Prior to Visit  Medication Sig  . allopurinol (ZYLOPRIM) 300 MG tablet Take 300 mg by mouth daily.  Marland Kitchen amLODipine (NORVASC) 5 MG tablet Take 5 mg by mouth daily.  Marland Kitchen atenolol (TENORMIN) 50 MG tablet Take 50 mg by mouth daily.  . fluticasone (FLONASE) 50 MCG/ACT nasal spray Place 2 sprays into both nostrils daily. Use for 4-6 weeks then stop and use seasonally or as needed.  Marland Kitchen lisinopril (PRINIVIL,ZESTRIL) 40 MG tablet Take 40 mg by mouth daily.   No current facility-administered medications on file prior to visit.     Review of Systems  Constitutional: Negative for activity change, appetite change, chills, diaphoresis, fatigue and fever.  HENT: Negative for congestion and hearing loss.   Eyes: Negative for visual disturbance.  Respiratory: Positive for cough. Negative for apnea, choking, chest tightness, shortness of breath and wheezing.   Cardiovascular: Negative for chest pain, palpitations and leg swelling.  Gastrointestinal: Negative for abdominal pain, anal bleeding, blood in stool, constipation, diarrhea, nausea and vomiting.  Endocrine: Negative for cold intolerance.  Genitourinary: Negative for difficulty urinating, dysuria, frequency and hematuria.  Musculoskeletal: Positive for arthralgias (knees). Negative for back pain and neck pain.  Skin: Negative for rash.  Allergic/Immunologic: Negative for  environmental allergies.  Neurological: Negative for dizziness, weakness, light-headedness, numbness and headaches.  Hematological: Negative for adenopathy.  Psychiatric/Behavioral: Negative for behavioral problems, dysphoric mood and sleep disturbance. The patient is not nervous/anxious.    Per HPI unless specifically indicated above     Objective:    BP 124/60 (BP Location: Left Arm, Cuff Size: Normal)   Pulse 60   Temp 97.9 F (36.6 C) (Oral)   Resp 16   Ht 5\' 7"  (1.702 m)   Wt 222 lb 9.6 oz (101 kg)   BMI 34.86 kg/m   Wt Readings from Last 3 Encounters:  03/05/18 222 lb 9.6 oz (101 kg)  02/22/18 223 lb 3.2 oz (101.2 kg)  02/11/18 226 lb (102.5 kg)    Physical Exam  Constitutional: He is oriented to person, place, and time. He appears well-developed and well-nourished. No distress.  Well-appearing, comfortable,  cooperative, obese but tall with large body habitus  HENT:  Head: Normocephalic and atraumatic.  Mouth/Throat: Oropharynx is clear and moist.  Eyes: Pupils are equal, round, and reactive to light. Conjunctivae and EOM are normal. Right eye exhibits no discharge. Left eye exhibits no discharge.  Neck: Normal range of motion. Neck supple. No thyromegaly present.  Cardiovascular: Normal rate, regular rhythm, normal heart sounds and intact distal pulses.  No murmur heard. Pulmonary/Chest: Effort normal. No respiratory distress. He has no wheezes. He has no rales.  Rare cough. Some rhonchi bilateral bases R>L, clears with cough. Speaks full sentences. No increased work of breathing. No focal wheezing or crackles  Abdominal: Soft. Bowel sounds are normal. He exhibits no distension and no mass. There is no tenderness.  Musculoskeletal: Normal range of motion. He exhibits no edema or tenderness.  Upper / Lower Extremities: - Normal muscle tone, strength bilateral upper extremities 5/5, lower extremities 5/5  Bilateral Knees with significant palpable crepitus. No effusion or  ecchymosis. Range of motion is mostly preserved flex/ext.  Lymphadenopathy:    He has no cervical adenopathy.  Neurological: He is alert and oriented to person, place, and time.  Distal sensation intact to light touch all extremities  Skin: Skin is warm and dry. No rash noted. He is not diaphoretic. No erythema.  Psychiatric: He has a normal mood and affect. His behavior is normal.  Well groomed, good eye contact, normal speech and thoughts  Nursing note and vitals reviewed.  Results for orders placed or performed in visit on 02/28/18  Uric acid  Result Value Ref Range   Uric Acid, Serum 5.1 4.0 - 8.0 mg/dL  PSA, Total with Reflex to PSA, Free  Result Value Ref Range   PSA, Total 0.5 < OR = 4.0 ng/mL  Lipid panel  Result Value Ref Range   Cholesterol 168 <200 mg/dL   HDL 40 (L) >40 mg/dL   Triglycerides 95 <150 mg/dL   LDL Cholesterol (Calc) 109 (H) mg/dL (calc)   Total CHOL/HDL Ratio 4.2 <5.0 (calc)   Non-HDL Cholesterol (Calc) 128 <130 mg/dL (calc)  COMPLETE METABOLIC PANEL WITH GFR  Result Value Ref Range   Glucose, Bld 112 (H) 65 - 99 mg/dL   BUN 19 7 - 25 mg/dL   Creat 1.28 (H) 0.70 - 1.11 mg/dL   GFR, Est Non African American 51 (L) > OR = 60 mL/min/1.53m2   GFR, Est African American 59 (L) > OR = 60 mL/min/1.54m2   BUN/Creatinine Ratio 15 6 - 22 (calc)   Sodium 140 135 - 146 mmol/L   Potassium 5.1 3.5 - 5.3 mmol/L   Chloride 105 98 - 110 mmol/L   CO2 27 20 - 32 mmol/L   Calcium 9.3 8.6 - 10.3 mg/dL   Total Protein 6.8 6.1 - 8.1 g/dL   Albumin 4.4 3.6 - 5.1 g/dL   Globulin 2.4 1.9 - 3.7 g/dL (calc)   AG Ratio 1.8 1.0 - 2.5 (calc)   Total Bilirubin 0.5 0.2 - 1.2 mg/dL   Alkaline phosphatase (APISO) 64 40 - 115 U/L   AST 18 10 - 35 U/L   ALT 10 9 - 46 U/L  CBC with Differential/Platelet  Result Value Ref Range   WBC 16.1 (H) 3.8 - 10.8 Thousand/uL   RBC 5.52 4.20 - 5.80 Million/uL   Hemoglobin 12.7 (L) 13.2 - 17.1 g/dL   HCT 40.0 38.5 - 50.0 %   MCV 72.5 (L)  80.0 - 100.0 fL   MCH  23.0 (L) 27.0 - 33.0 pg   MCHC 31.8 (L) 32.0 - 36.0 g/dL   RDW 19.3 (H) 11.0 - 15.0 %   Platelets 227 140 - 400 Thousand/uL   MPV  7.5 - 12.5 fL   Neutro Abs 4,283 1,500 - 7,800 cells/uL   Lymphs Abs 9,563 (H) 850 - 3,900 cells/uL   WBC mixed population 1,771 (H) 200 - 950 cells/uL   Eosinophils Absolute 354 15 - 500 cells/uL   Basophils Absolute 129 0 - 200 cells/uL   Neutrophils Relative % 26.6 %   Total Lymphocyte 59.4 %   Monocytes Relative 11.0 %   Eosinophils Relative 2.2 %   Basophils Relative 0.8 %   Smear Review    Hemoglobin A1c  Result Value Ref Range   Hgb A1c MFr Bld 6.3 (H) <5.7 % of total Hgb   Mean Plasma Glucose 134 (calc)   eAG (mmol/L) 7.4 (calc)      Assessment & Plan:   Problem List Items Addressed This Visit    CKD (chronic kidney disease), stage III (HCC)    Stable CKD-III With mild elevated Cr 1.2 to 1.3 Remain off oral NSAIDs Improve hydration Control BP and sugar, concern elevated A1c Follow-up as planned monitor Cr      Gout    Stable w/o acute flare. Seems complicated by Chronic Gouty Arthritis with chronic changes of knees. Limit his ambulation at times due to chronic pain and stiff Known chronic gout in R knee > L - Currently on Allopurinol 300mg  daily - Last uric acid level normal 5.1 (02/2018)  Plan: 1. Continue Allopurinol 300mg  daily for prophylaxis - Avoid food triggers (red meat, alcohol)  Future will need X-rays of knees - consider steroid injections vs topical NSAID if need      Hyperlipidemia    Mostly controlled cholesterol with mild elevated LDL Last lipid panel 02/2018 Calculated ASCVD 10 yr risk score elevated risk due to age  Plan: 1. Decline statin at this time - based on cholesterol and not interested in therapy 2. Encourage improved lifestyle - low carb/cholesterol, reduce portion size, continue improving regular exercise Follow-up yearly lipids       Hypertension    Well-controlled HTN -  improved on re-check - Home BP readings reportedly normal No known complications     Plan:  1. Continue current BP regimen - Lisinopril 40mg , Atenolol 50mg , Amlodipine 5mg  daily 2. Encourage improved lifestyle - low sodium diet, regular exercise as tolerated 3. Continue monitor BP outside office, bring readings to next visit, if persistently >140/90 or new symptoms notify office sooner 4. Follow-up in 3 months  Recommend future switch from Lisinopril to Losartan - may possibly have some cough with ACEi also lower gout risk on Losartan      Morbid obesity ([place])    Considered morbid obesity w/ BMI >36 (not >40) due to co morbid conditions/risk factors with Hyperlipidemia, Hypertension and chronic gout  Counseling on lifestyle, diet exercise improve goal wt loss      Osteoarthritis of multiple joints    See A&P gout, likely affected chronic knee pain and joint dysfunction - Next visit consider X-rays of knees, injection and other therapy      Pre-diabetes    Concern elevated A1c to 6.3 Concern with obesity, HTN, HLD  Plan:  1. Not on any therapy currently - defer meds for now - in future if need we can consider options 2. Encourage improved lifestyle - low carb, low sugar diet,  reduce portion size, continue improving regular exercise 3. Follow-up A1c q 3-6 months        Other Visit Diagnoses    Annual physical exam    -  Primary Updated Health Maintenance information Reviewed recent lab results with patient Encouraged improvement to lifestyle with diet and exercise - Goal of weight loss     Need for 23-polyvalent pneumococcal polysaccharide vaccine       Relevant Orders   Pneumococcal polysaccharide vaccine 23-valent greater than or equal to 2yo subcutaneous/IM (Completed)      No orders of the defined types were placed in this encounter.   Follow up plan: Return in about 3 months (around 06/05/2018) for PreDM A1c, HTN, Knee Arthritis - X-ray.  Nobie Putnam, [facility] 03/05/2018, 1:29 PM

## 2018-03-05 NOTE — Assessment & Plan Note (Signed)
Well-controlled HTN - improved on re-check - Home BP readings reportedly normal No known complications     Plan:  1. Continue current BP regimen - Lisinopril 40mg , Atenolol 50mg , Amlodipine 5mg  daily 2. Encourage improved lifestyle - low sodium diet, regular exercise as tolerated 3. Continue monitor BP outside office, bring readings to next visit, if persistently >140/90 or new symptoms notify office sooner 4. Follow-up in 3 months  Recommend future switch from Lisinopril to Losartan - may possibly have some cough with ACEi also lower gout risk on Losartan

## 2018-03-05 NOTE — Assessment & Plan Note (Signed)
Considered morbid obesity w/ BMI >36 (not >40) due to co morbid conditions/risk factors with Hyperlipidemia, Hypertension and chronic gout  Counseling on lifestyle, diet exercise improve goal wt loss

## 2018-03-05 NOTE — Patient Instructions (Addendum)
Thank you for coming to the office today.  Keep up the good work  Try to limit starches carbs and sugar - to help prevent diabetes. Now you are a Pre-Diabetic A1c 6.3  Knee symptoms likely arthritis from prior gout - we can check X-ray next time and consider injection if you need it or Orthopedics  BP is improved - keep track of blood pressure as needed few times a week is fine.  No change to medicines - let me know if due for refill  If cough not improving - call back and we can try a different antibiotic and possibly prednisone  Please schedule a Follow-up Appointment to: Return in about 3 months (around 06/05/2018) for PreDM A1c, HTN, Knee Arthritis - X-ray.  If you have any other questions or concerns, please feel free to call the office or send a message through [place]. You may also schedule an earlier appointment if necessary.  Additionally, you may be receiving a survey about your experience at our office within a few days to 1 week by e-mail or mail. We value your feedback.  Nobie Putnam, DO [place]

## 2018-03-05 NOTE — Assessment & Plan Note (Signed)
Mostly controlled cholesterol with mild elevated LDL Last lipid panel 02/2018 Calculated ASCVD 10 yr risk score elevated risk due to age  Plan: 1. Decline statin at this time - based on cholesterol and not interested in therapy 2. Encourage improved lifestyle - low carb/cholesterol, reduce portion size, continue improving regular exercise Follow-up yearly lipids

## 2018-03-05 NOTE — Assessment & Plan Note (Signed)
Stable w/o acute flare. Seems complicated by Chronic Gouty Arthritis with chronic changes of knees. Limit his ambulation at times due to chronic pain and stiff Known chronic gout in R knee > L - Currently on Allopurinol 300mg  daily - Last uric acid level normal 5.1 (02/2018)  Plan: 1. Continue Allopurinol 300mg  daily for prophylaxis - Avoid food triggers (red meat, alcohol)  Future will need X-rays of knees - consider steroid injections vs topical NSAID if need

## 2018-05-09 ENCOUNTER — Telehealth: Payer: Self-pay | Admitting: Family Medicine

## 2018-05-09 DIAGNOSIS — R609 Edema, unspecified: Secondary | ICD-10-CM

## 2018-05-09 DIAGNOSIS — I890 Lymphedema, not elsewhere classified: Secondary | ICD-10-CM | POA: Insufficient documentation

## 2018-05-09 NOTE — Telephone Encounter (Signed)
Patient daughter was asking for medical supply store suggested [facility] she wants Dr. Raliegh Ip to send Rx.

## 2018-05-09 NOTE — Telephone Encounter (Signed)
Handwritten rx for Compression Stockings 15-91mmHg - in my mail outbox  To be faxed to [place] 05/09/2018, 2:39 PM

## 2018-05-09 NOTE — Telephone Encounter (Signed)
Where was pt supposed to go to try on circulation stockings?  Please call [phone]

## 2018-05-09 NOTE — Telephone Encounter (Signed)
Patient's daughter was informed and Rx faxed.

## 2018-05-14 ENCOUNTER — Other Ambulatory Visit: Payer: Self-pay | Admitting: Family Medicine

## 2018-05-14 DIAGNOSIS — N183 Chronic kidney disease, stage 3 unspecified: Secondary | ICD-10-CM

## 2018-05-14 DIAGNOSIS — I10 Essential (primary) hypertension: Secondary | ICD-10-CM

## 2018-05-14 DIAGNOSIS — E7800 Pure hypercholesterolemia, unspecified: Secondary | ICD-10-CM

## 2018-05-14 DIAGNOSIS — M1A9XX0 Chronic gout, unspecified, without tophus (tophi): Secondary | ICD-10-CM

## 2018-05-14 MED ORDER — AMLODIPINE BESYLATE 5 MG PO TABS: 5.0000 mg | ORAL_TABLET | Freq: Every day | ORAL | 1 refills | Status: DC

## 2018-05-14 MED ORDER — LISINOPRIL 40 MG PO TABS: 40.0000 mg | ORAL_TABLET | Freq: Every day | ORAL | 1 refills | Status: DC

## 2018-05-14 MED ORDER — ALLOPURINOL 300 MG PO TABS: 300.0000 mg | ORAL_TABLET | Freq: Every day | ORAL | 1 refills | Status: DC

## 2018-05-14 MED ORDER — ATENOLOL 50 MG PO TABS: 50.0000 mg | ORAL_TABLET | Freq: Every day | ORAL | 1 refills | Status: DC

## 2018-05-14 NOTE — Telephone Encounter (Signed)
Pt needs refills on allopurinol, amlodipine, atenolol and lisinorpil sent to [facility]

## 2018-07-03 ENCOUNTER — Encounter: Payer: Self-pay | Admitting: Family Medicine

## 2018-07-03 ENCOUNTER — Ambulatory Visit (INDEPENDENT_AMBULATORY_CARE_PROVIDER_SITE_OTHER): Payer: Medicare Other | Admitting: Family Medicine

## 2018-07-03 ENCOUNTER — Ambulatory Visit
Admission: RE | Admit: 2018-07-03 | Discharge: 2018-07-03 | Disposition: A | Discharge status: Home / Self Care | Payer: Medicare Other | Source: Ambulatory Visit | Attending: Family Medicine | Admitting: Family Medicine

## 2018-07-03 VITALS — BP 147/100 | HR 52 | Temp 98.0°F | Resp 16 | Ht 67.0 in | Wt 222.4 lb

## 2018-07-03 DIAGNOSIS — M25561 Pain in right knee: Secondary | ICD-10-CM | POA: Diagnosis not present

## 2018-07-03 DIAGNOSIS — M1A0610 Idiopathic chronic gout, right knee, without tophus (tophi): Secondary | ICD-10-CM

## 2018-07-03 DIAGNOSIS — M159 Polyosteoarthritis, unspecified: Secondary | ICD-10-CM

## 2018-07-03 DIAGNOSIS — M8949 Other hypertrophic osteoarthropathy, multiple sites: Secondary | ICD-10-CM

## 2018-07-03 DIAGNOSIS — M150 Primary generalized (osteo)arthritis: Secondary | ICD-10-CM

## 2018-07-03 DIAGNOSIS — M1711 Unilateral primary osteoarthritis, right knee: Secondary | ICD-10-CM | POA: Diagnosis not present

## 2018-07-03 DIAGNOSIS — Z23 Encounter for immunization: Secondary | ICD-10-CM | POA: Diagnosis not present

## 2018-07-03 DIAGNOSIS — M170 Bilateral primary osteoarthritis of knee: Secondary | ICD-10-CM | POA: Insufficient documentation

## 2018-07-03 DIAGNOSIS — R936 Abnormal findings on diagnostic imaging of limbs: Secondary | ICD-10-CM | POA: Diagnosis not present

## 2018-07-03 DIAGNOSIS — M25562 Pain in left knee: Secondary | ICD-10-CM | POA: Diagnosis not present

## 2018-07-03 DIAGNOSIS — M1712 Unilateral primary osteoarthritis, left knee: Secondary | ICD-10-CM | POA: Diagnosis not present

## 2018-07-03 MED ORDER — MELOXICAM 7.5 MG PO TABS: 7.5000 mg | ORAL_TABLET | Freq: Every day | ORAL | 2 refills | Status: DC | PRN

## 2018-07-03 NOTE — Progress Notes (Addendum)
Subjective:    Patient ID: Robert [name], male    DOB: 24-May-1932, 84 y.o.   MRN: [id number]  Robert [name] is a 84 y.o. male presenting on 07/03/2018 for Knee Pain (follow up)  History mostly provided by patient's family, granddaughter.  HPI  Follow-up Knee Pain / Bilateral Gout Arthritis - Last visit with me 02/2018, for annual physical, we discussed same problem without changes to treatment plan, he was continued on Allopurinol to keep lowering uric acid, see prior notes for background information. - Interval update with seems persistent worsening R knee pain - Today patient reports worse R knee pain. Has episodes of pain in knee, limiting his ambulation, some day better than others, worse if over activity and increased walking, or worse in colder rainy weather - No prior knee x-ray on file, interested in this today - He has chronic known knee issues >10 years - Last update with Uric Acid 5.1, recently checked normal range. He takes Allopurinol 300mg  - Taking Advil PRN or Tylenol - occasionally Denies injury recent fall or trauma, redness, swelling, numb tingling  Health Maintenance: Due for Flu Shot, will receive today    Depression screen [facility] 2/9 07/03/2018 03/05/2018 02/11/2018  Decreased Interest 0 0 0  Down, Depressed, Hopeless 0 0 0  PHQ - 2 Score 0 0 0    Social History   Tobacco Use  . Smoking status: Former Research scientist (life sciences)  . Smokeless tobacco: Former Network engineer Use Topics  . Alcohol use: Never    Frequency: Never  . Drug use: Never    Review of Systems Per HPI unless specifically indicated above     Objective:    BP (!) 147/100   Pulse (!) 52   Temp 98 F (36.7 C) (Oral)   Resp 16   Ht 5\' 7"  (1.702 m)   Wt 222 lb 6.4 oz (100.9 kg)   BMI 34.83 kg/m   Wt Readings from Last 3 Encounters:  07/03/18 222 lb 6.4 oz (100.9 kg)  03/05/18 222 lb 9.6 oz (101 kg)  02/22/18 223 lb 3.2 oz (101.2 kg)    Physical Exam  Constitutional: He is oriented to person,  place, and time. He appears well-developed and well-nourished. No distress.  Well-appearing, comfortable, cooperative  HENT:  Head: Normocephalic and atraumatic.  Mouth/Throat: Oropharynx is clear and moist.  Eyes: Conjunctivae are normal. Right eye exhibits no discharge. Left eye exhibits no discharge.  Cardiovascular: Normal rate.  Pulmonary/Chest: Effort normal.  Musculoskeletal: He exhibits no edema.  Bilateral Knees Inspection: Moderate bulky appearance R knee. No ecchymosis or effusion. Palpation: Mild +TTP R knee only bilateral joint line. Significant palpable and audible crepitus R knee, only minimal vs mild on L knee ROM: Mostly full active ROM bilaterally, some slightly restricted full knee extension. Special Testing: Lachman / Valgus/Varus tests negative with intact ligaments (ACL, MCL, LCL). Strength: 5/5 intact knee flex/ext, ankle dorsi/plantarflex Neurovascular: distally intact sensation light touch and pulses   Neurological: He is alert and oriented to person, place, and time.  Skin: Skin is warm and dry. No rash noted. He is not diaphoretic. No erythema.  Psychiatric: He has a normal mood and affect. His behavior is normal.  Well groomed, good eye contact, normal speech and thoughts  Nursing note and vitals reviewed.  I have personally reviewed the radiology report from R Knee / Bilateral Knee AP Standing X-ray on 07/03/18.  CLINICAL DATA:  84 year old male with intermittent right knee pain. No recent injury. Symptoms increase  with standing.  EXAM: RIGHT KNEE - COMPLETE 4+ VIEW;  BILATERAL KNEES STANDING - 1 VIEW  COMPARISON:  None.  FINDINGS: Standing views of both knees demonstrate moderate to severe bilateral medial compartment knee joint space loss, but worse on the right- approaching a bone-on-bone.  Tricompartmental right knee joint degenerative spurring, also moderate to severe in the patellofemoral compartment. Probable small joint effusion. No  acute osseous abnormality identified. Mild calcified peripheral vascular disease.  Incidental small 8 millimeter retained radiopaque foreign body in the subcutaneous fat medial to the left knee.  IMPRESSION: Right greater than left knee joint degeneration, severe in the medial and patellofemoral compartments.  Possible small right knee joint effusion but no acute osseous abnormality identified.   Electronically Signed   By: Genevie Ann M.D.   On: 07/03/2018 14:48  Results for orders placed or performed in visit on 02/28/18  Uric acid  Result Value Ref Range   Uric Acid, Serum 5.1 4.0 - 8.0 mg/dL  PSA, Total with Reflex to PSA, Free  Result Value Ref Range   PSA, Total 0.5 < OR = 4.0 ng/mL  Lipid panel  Result Value Ref Range   Cholesterol 168 <200 mg/dL   HDL 40 (L) >40 mg/dL   Triglycerides 95 <150 mg/dL   LDL Cholesterol (Calc) 109 (H) mg/dL (calc)   Total CHOL/HDL Ratio 4.2 <5.0 (calc)   Non-HDL Cholesterol (Calc) 128 <130 mg/dL (calc)  COMPLETE METABOLIC PANEL WITH GFR  Result Value Ref Range   Glucose, Bld 112 (H) 65 - 99 mg/dL   BUN 19 7 - 25 mg/dL   Creat 1.28 (H) 0.70 - 1.11 mg/dL   GFR, Est Non African American 51 (L) > OR = 60 mL/min/1.45m2   GFR, Est African American 59 (L) > OR = 60 mL/min/1.75m2   BUN/Creatinine Ratio 15 6 - 22 (calc)   Sodium 140 135 - 146 mmol/L   Potassium 5.1 3.5 - 5.3 mmol/L   Chloride 105 98 - 110 mmol/L   CO2 27 20 - 32 mmol/L   Calcium 9.3 8.6 - 10.3 mg/dL   Total Protein 6.8 6.1 - 8.1 g/dL   Albumin 4.4 3.6 - 5.1 g/dL   Globulin 2.4 1.9 - 3.7 g/dL (calc)   AG Ratio 1.8 1.0 - 2.5 (calc)   Total Bilirubin 0.5 0.2 - 1.2 mg/dL   Alkaline phosphatase (APISO) 64 40 - 115 U/L   AST 18 10 - 35 U/L   ALT 10 9 - 46 U/L  CBC with Differential/Platelet  Result Value Ref Range   WBC 16.1 (H) 3.8 - 10.8 Thousand/uL   RBC 5.52 4.20 - 5.80 Million/uL   Hemoglobin 12.7 (L) 13.2 - 17.1 g/dL   HCT 40.0 38.5 - 50.0 %   MCV 72.5 (L) 80.0  - 100.0 fL   MCH 23.0 (L) 27.0 - 33.0 pg   MCHC 31.8 (L) 32.0 - 36.0 g/dL   RDW 19.3 (H) 11.0 - 15.0 %   Platelets 227 140 - 400 Thousand/uL   MPV  7.5 - 12.5 fL   Neutro Abs 4,283 1,500 - 7,800 cells/uL   Lymphs Abs 9,563 (H) 850 - 3,900 cells/uL   WBC mixed population 1,771 (H) 200 - 950 cells/uL   Eosinophils Absolute 354 15 - 500 cells/uL   Basophils Absolute 129 0 - 200 cells/uL   Neutrophils Relative % 26.6 %   Total Lymphocyte 59.4 %   Monocytes Relative 11.0 %   Eosinophils Relative 2.2 %  Basophils Relative 0.8 %   Smear Review    Hemoglobin A1c  Result Value Ref Range   Hgb A1c MFr Bld 6.3 (H) <5.7 % of total Hgb   Mean Plasma Glucose 134 (calc)   eAG (mmol/L) 7.4 (calc)      Assessment & Plan:   Problem List Items Addressed This Visit    Gout   Relevant Medications   meloxicam (MOBIC) 7.5 MG tablet   Other Relevant Orders   DG Knee Complete 4 Views Right   DG Knee Bilateral Standing AP   Osteoarthritis of multiple joints - Primary   Relevant Medications   meloxicam (MOBIC) 7.5 MG tablet   Other Relevant Orders   DG Knee Complete 4 Views Right   DG Knee Bilateral Standing AP    Other Visit Diagnoses    Needs flu shot       Relevant Orders   Flu vaccine HIGH DOSE PF (Completed)      Subacute on chronic R > L generalized Knee pain without swelling. No new injury Suspected likely due to underlying osteoarthritis / DJD with known OA/DJD in other joints. Less likely acute ligament injury, given age and DJD more at risk for chronic degenerative tear - Able to bear weight without obvious knee instability - No prior history of knee surgery, arthroscopy - Inadequate conservative therapy   Plan: Knee X-rays - Bilateral AP standing and R knee series - will follow-up results  Offered Steroid knee injection R knee today, but declined - will await x-ray results and treatment first  1. Start Tylenol 500-1000mg  per dose TID PRN breakthrough - Continue NSAID  PRN 2. RICE therapy (rest, ice, compression, elevation) for swelling, activity modification Follow-up 2-4 months or sooner, if still worsening, consider steroid injection and referral to Ortho for further eval, may need MRI if concern remains for meniscus / ligament injury   Meds ordered this encounter  Medications  . meloxicam (MOBIC) 7.5 MG tablet    Sig: Take 1 tablet (7.5 mg total) by mouth daily as needed for pain.    Dispense:  30 tablet    Refill:  2    Follow up plan: Return in about 4 months (around 11/01/2018) for Knee Pain Arthritis, gout, ?injection.  Nobie Putnam, [facility] 07/03/2018, 8:59 AM  **Updated Knee X-ray results in note after visit - now reviewed x-rays, shows significant R>L degenerative arthritis that accounts for his symptoms. Attempted to call patient, did not reach him by phone today, will ask staff to reach back out to him next week with X-ray results. No change in treatment plan. I am concerned with the severity of his arthritis and ultimately do think he will benefit from steroid injection, in future may need referral to Orthopedics**  Nobie Putnam, [facility] 07/03/2018, 4:33 PM

## 2018-07-03 NOTE — Patient Instructions (Addendum)
Thank you for coming to the office today.  You most likely have a Knee Pain from arthritis and history of gout.   X-ray today  Stay tuned for results  - I recommend a Knee Brace to support your knee and limit motion of the knee to help it heal, avoid excess weight bearing and repetitive activities on it  Recommend trial of Anti-inflammatory with Meloxicam 7.5mg  tabs - take one with food and plenty of water daily ONLY AS NEEDED WIT MEAL - DO NOT TAKE any ibuprofen, advil, aleve, motrin while you are taking this medicine - It is safe to take Tylenol Ext Str 500mg  tabs - take 1 to 2 (max dose 1000mg ) every 6 hours as needed for breakthrough pain, max 24 hour daily dose is 6 to 8 tablets or 4000mg   Use RICE therapy: - R - Rest / relative rest with activity modification avoid overuse and frequent bending or pressure on bent knee - I - Ice packs (make sure you use a towel or sock / something to protect skin) - C - Compression with ACE wrap or Knee Brace or Knee SLeve apply pressure and reduce swelling allowing more support - E - Elevation - if significant swelling, lift leg above heart level (toes above your nose) to help reduce swelling, most helpful at night after day of being on your feet  Future we can do steroid injection into knee  Flu shot today  ---- Robert [name], [place] [id number] Open until 5PM Phone: [phone]  Compression stockings   Please schedule a Follow-up Appointment to: Return in about 4 months (around 11/01/2018) for Knee Pain Arthritis, gout, ?injection.  If you have any other questions or concerns, please feel free to call the office or send a message through [place]. You may also schedule an earlier appointment if necessary.  Additionally, you may be receiving a survey about your experience at our office within a few days to 1 week by e-mail or mail. We value your feedback.  Robert Putnam, DO [place]

## 2018-11-04 ENCOUNTER — Other Ambulatory Visit: Payer: Self-pay

## 2018-11-04 ENCOUNTER — Ambulatory Visit (INDEPENDENT_AMBULATORY_CARE_PROVIDER_SITE_OTHER): Payer: Medicare Other | Admitting: Family Medicine

## 2018-11-04 ENCOUNTER — Encounter: Payer: Self-pay | Admitting: Family Medicine

## 2018-11-04 DIAGNOSIS — R059 Cough, unspecified: Secondary | ICD-10-CM

## 2018-11-04 DIAGNOSIS — J019 Acute sinusitis, unspecified: Secondary | ICD-10-CM

## 2018-11-04 DIAGNOSIS — R05 Cough: Secondary | ICD-10-CM

## 2018-11-04 MED ORDER — BENZONATATE 100 MG PO CAPS: 100.0000 mg | ORAL_CAPSULE | Freq: Three times a day (TID) | ORAL | 0 refills | Status: DC | PRN

## 2018-11-04 MED ORDER — IPRATROPIUM BROMIDE 0.06 % NA SOLN: 2.0000 | Freq: Four times a day (QID) | NASAL | 0 refills | Status: DC

## 2018-11-04 NOTE — Patient Instructions (Addendum)
1. It sounds like you have persistent Sinus Congestion or "Rhinosinusitis" - I do not think that this is a Bacterial Sinus Infection. Usually these are caused by Viruses or Allergies, and will run it's course in about 7 to 10 days. - No antibiotics are needed Start Atrovent nasal spray decongestant 2 sprays in each nostril up to 4 times daily for 7 days Start Tessalon Perls take 1 capsule up to 3 times a day as needed for cough May try OTC Mucinex  up to 7-10 days then stop  - Recommend to start using Nasal Saline spray multiple times a day to help flush out congestion and clear sinuses - Improve hydration by drinking plenty of clear fluids (water, gatorade) to reduce secretions and thin congestion - Congestion draining down throat can cause irritation. May try warm herbal tea with honey, cough drops - Can take Tylenol or Ibuprofen as needed for fevers  If you develop persistent fever >101F for at least 3 consecutive days, headaches with sinus pain or pressure or persistent earache, please schedule a follow-up evaluation within next few days to week.   If you have any other questions or concerns, please feel free to call the office or send a message through [place]. You may also schedule an earlier appointment if necessary.  Additionally, you may be receiving a survey about your experience at our office within a few days to 1 week by e-mail or mail. We value your feedback.  Nobie Putnam, DO [place]

## 2018-11-04 NOTE — Progress Notes (Signed)
Virtual Visit via Telephone The purpose of this virtual visit is to provide medical care while limiting exposure to the novel coronavirus (COVID19) for both patient and office staff.  Consent was obtained for phone visit:  Yes.   Answered questions that patient had about telehealth interaction:  Yes.   I discussed the limitations, risks, security and privacy concerns of performing an evaluation and management service by telephone. I also discussed with the patient that there may be a patient responsible charge related to this service. The patient expressed understanding and agreed to proceed.  Patient Location: Car (granddaugther is driving, and pulled over) - History provided by patient's granddaughter, patient is next to phone as well. Provider Location: [facility])   ---------------------------------------------------------------------- Chief Complaint  Patient presents with  . Cough    runny nose, SOB sometime denies fever onset 3 days    S: Reviewed CMA telephone note below. I have called patient and gathered additional HPI as follows:  Reports that symptoms started about 3 days ago with URI symptoms with congestion cold symptoms, no significant sick contacts at home with family member. He is former smoker in past, no confirmed history of COPD asthma or other lung problem. - Tried OTC taking Robitussin  Patient currently in home isolation Denies any high risk travel to areas of current concern for COVID19. Denies any known or suspected exposure to person with or possibly with COVID19.  Admits shortness of breath with some coughing spells worse only when sick, but has some mild baseline dyspnea normally Admits some sinus drainage Denies any fevers, chills, sweats, body ache, cough, shortness of breath, sinus pain or pressure, headache, abdominal pain, diarrhea  Social History   Tobacco Use  . Smoking status: Former Research scientist (life sciences)  . Smokeless tobacco: Former  Network engineer Use Topics  . Alcohol use: Never    Frequency: Never  . Drug use: Never    -------------------------------------------------------------------------- O: No physical exam performed due to remote telephone encounter.  -------------------------------------------------------------------------- A&P:  Suspected Acute Sinusitis, possible for benign viral etiology at onset vs allergy onset No criteria for secondary bacterial infection at this time. - Reassuring without high risk symptoms - Afebrile, without dyspnea except with coughing spell - No comorbid pulmonary conditions (asthma, COPD) or immunocompromise - Concern with age 85  - Currently patient is LOW RISK for COVID19 based on current symptoms and no known travel/exposure - however at this time, now [place] is currently within phase of community spread, and therefore patient can still be potentially exposed. Cannot rule out case with mild symptoms. Testing is not recommended at this time due to mild symptoms.  1. Reassurance, supportive care OTC meds 2. Start Tessalon Perls take 1 capsule up to 3 times a day as needed for cough 3. Start Atrovent nasal spray decongestant 2 sprays in each nostril up to 4 times daily for 7 days 4. Mucinex PRN 5. No antibiotics at this time 6. Return criteria below  Meds ordered this encounter  Medications  . benzonatate (TESSALON) 100 MG capsule    Sig: Take 1 capsule (100 mg total) by mouth 3 (three) times daily as needed for cough.    Dispense:  30 capsule    Refill:  0  . ipratropium (ATROVENT) 0.06 % nasal spray    Sig: Place 2 sprays into both nostrils 4 (four) times daily. For up to 5-7 days then stop.    Dispense:  15 mL    Refill:  0  Return in about 1 week (around 11/11/2018) for follow-up in 1 week for - telephone 6 month med refill.   OPTIONAL RECOMMENDED self quarantine for patient safety for PREVENTION ONLY. It is not required based on current clinical symptoms. If  they were to develop fever or worsening shortness of breath, then emphasis on REQUIRED quarantine for up to 7-14 days that could be resolved if fever free >3 days AND if symptoms improving after 7 days.   If symptoms do not resolve or significantly improve OR if WORSENING - fever / cough - or worsening shortness of breath - then should contact us and seek advice on next steps in treatment at home vs where/when to seek care at Urgent Care or Hospital ED for further intervention and possible testing if indicated.  Patient verbalizes understanding with the above medical recommendations including the limitation of remote medical advice.  Specific follow-up / call-back criteria were given for patient to follow-up or seek medical care more urgently if needed.  - Time spent in direct consultation with patient on phone: 8 minutes   Nobie Putnam, [facility] 11/04/2018, 8:26 AM

## 2018-11-08 ENCOUNTER — Encounter: Payer: Self-pay | Admitting: Family Medicine

## 2018-11-08 ENCOUNTER — Other Ambulatory Visit: Payer: Self-pay | Admitting: Family Medicine

## 2018-11-08 ENCOUNTER — Other Ambulatory Visit: Payer: Self-pay

## 2018-11-08 ENCOUNTER — Ambulatory Visit (INDEPENDENT_AMBULATORY_CARE_PROVIDER_SITE_OTHER): Payer: Medicare Other | Admitting: Family Medicine

## 2018-11-08 DIAGNOSIS — M150 Primary generalized (osteo)arthritis: Secondary | ICD-10-CM | POA: Diagnosis not present

## 2018-11-08 DIAGNOSIS — R7303 Prediabetes: Secondary | ICD-10-CM

## 2018-11-08 DIAGNOSIS — I10 Essential (primary) hypertension: Secondary | ICD-10-CM

## 2018-11-08 DIAGNOSIS — M1A0610 Idiopathic chronic gout, right knee, without tophus (tophi): Secondary | ICD-10-CM

## 2018-11-08 DIAGNOSIS — E7800 Pure hypercholesterolemia, unspecified: Secondary | ICD-10-CM

## 2018-11-08 DIAGNOSIS — M8949 Other hypertrophic osteoarthropathy, multiple sites: Secondary | ICD-10-CM

## 2018-11-08 DIAGNOSIS — M1A9XX0 Chronic gout, unspecified, without tophus (tophi): Secondary | ICD-10-CM

## 2018-11-08 DIAGNOSIS — Z0000 Encounter for general adult medical examination without abnormal findings: Secondary | ICD-10-CM

## 2018-11-08 DIAGNOSIS — I129 Hypertensive chronic kidney disease with stage 1 through stage 4 chronic kidney disease, or unspecified chronic kidney disease: Secondary | ICD-10-CM | POA: Diagnosis not present

## 2018-11-08 DIAGNOSIS — N183 Chronic kidney disease, stage 3 unspecified: Secondary | ICD-10-CM

## 2018-11-08 DIAGNOSIS — M159 Polyosteoarthritis, unspecified: Secondary | ICD-10-CM

## 2018-11-08 MED ORDER — AMLODIPINE BESYLATE 5 MG PO TABS: 5.0000 mg | ORAL_TABLET | Freq: Every day | ORAL | 1 refills | Status: DC

## 2018-11-08 MED ORDER — ALLOPURINOL 300 MG PO TABS: 300.0000 mg | ORAL_TABLET | Freq: Every day | ORAL | 1 refills | Status: DC

## 2018-11-08 MED ORDER — LISINOPRIL 40 MG PO TABS: 40.0000 mg | ORAL_TABLET | Freq: Every day | ORAL | 1 refills | Status: DC

## 2018-11-08 MED ORDER — ATENOLOL 50 MG PO TABS: 50.0000 mg | ORAL_TABLET | Freq: Every day | ORAL | 1 refills | Status: DC

## 2018-11-08 MED ORDER — MELOXICAM 7.5 MG PO TABS: 7.5000 mg | ORAL_TABLET | Freq: Every day | ORAL | 1 refills | Status: DC | PRN

## 2018-11-08 NOTE — Assessment & Plan Note (Signed)
Stable controlled without flare Likely chronic gouty arthritis contributing to chronic knee issues Last uric acid 5.1 (02/2018)  Continue on Allopurinol 300mg  daily, re order today Avoid food triggers  Recheck lab with tests in 5 months

## 2018-11-08 NOTE — Assessment & Plan Note (Signed)
Persistent chronic R > L generalized Knee pain, with known advanced osteoarthritis DJD No new injury or problem No prior history of knee surgery, arthroscopy Partial improvement on med management Last X-ray 06/2018 reviewed  Plan: Again offered steroid knee injection if interested in future - prefer to wait until reduce risk of COVID19 but may offer anytime if interested - He declined at this time.  1. Refill Meloxicam 7.5mg  daily PRN - advised avoid daily use due to CKD, take intermittently 2. Continue Tylenol high dose 1g TID PRN 3. RICE therapy (rest, ice, compression, elevation) for swelling, activity modification  Follow-up sooner as need if not improve - consider refer to Ortho or injections

## 2018-11-08 NOTE — Assessment & Plan Note (Signed)
Stable CKD, Cr on last lab On medication management, controlled HTN, on ACEi Follow-up

## 2018-11-08 NOTE — Patient Instructions (Signed)
Refilled medications  DUE for FASTING BLOOD WORK (no food or drink after midnight before the lab appointment, only water or coffee without cream/sugar on the morning of)  SCHEDULE "Lab Only" visit in the morning at the clinic for lab draw in 5 MONTHS   - Make sure Lab Only appointment is at about 1 week before your next appointment, so that results will be available  For Lab Results, once available within 2-3 days of blood draw, you can can log in to MyChart online to view your results and a brief explanation. Also, we can discuss results at next follow-up visit.   Please schedule a Follow-up Appointment to: Return in about 5 months (around 04/10/2019) for Annual Physical.  If you have any other questions or concerns, please feel free to call the office or send a message through [place]. You may also schedule an earlier appointment if necessary.  Additionally, you may be receiving a survey about your experience at our office within a few days to 1 week by e-mail or mail. We value your feedback.  Nobie Putnam, DO [place]

## 2018-11-08 NOTE — Progress Notes (Signed)
Virtual Visit via Telephone The purpose of this virtual visit is to provide medical care while limiting exposure to the novel coronavirus (COVID19) for both patient and office staff.  Consent was obtained for phone visit:  Yes.   Answered questions that patient had about telehealth interaction:  Yes.   I discussed the limitations, risks, security and privacy concerns of performing an evaluation and management service by telephone. I also discussed with the patient that there may be a patient responsible charge related to this service. The patient expressed understanding and agreed to proceed.  Patient Location: Home - History provided by patient's granddaughter, patient is next to phone as well. Provider Location: [facility])  ---------------------------------------------------------------------- Chief Complaint  Patient presents with  . Hypertension    S: Reviewed CMA telephone note below. I have called patient and gathered additional HPI as follows:  Follow-up Knee Pain / Bilateral Gout Arthritis - Last visit with me 06/2018, he had x-rays knees that showed significant moderate to severe R>L DJD see result and he was started on meloxicam 7.5mg  daily PRN pain and offered injections, see prior notes for background information. - Interval update with seems persistent knee pain without improvement or worsening - Today patient has no new complaints, still taking Meloxicam 7.5mg  daily PRN some relief, he has run out ask for refill now, thinks it helps - He has not had a new gout flare, still taking Allopurinol, needs refill. Right Knee worse - still has episodes of pain in knee, limiting his ambulation, some day better than others, worse if over activity and increased walking, or worse in colder rainy weather - He has chronic known knee issues >10 years - Last update with Uric Acid 5.1, recently checked normal range. He takes Allopurinol 300mg  - Taking Meloxicam 7.5mg   PRN, Tylenol - occasionally Denies injury recent fall or trauma, redness, swelling, numb tingling  CHRONIC HTN with CKD - III Reportsno concerns, has BP checked at home occasionally, normal recently Current Meds -Atenolol 50mg , Amlodpine 5mg , Lisinopril 40mg - due for refills Reports good compliance, took meds today. Tolerating well, w/o complaints. Denies CP, dyspnea, HA, edema, dizziness / lightheadedness  Patient is currently in isolation Denies any high risk travel to areas of current concern for COVID19. Denies any known or suspected exposure to person with or possibly with COVID19.  Denies any fevers, chills, sweats, body ache, cough, shortness of breath, sinus pain or pressure, headache, abdominal pain, diarrhea  Past Medical History:  Diagnosis Date  . Gout   . Hyperlipidemia   . Hypertension    Social History   Tobacco Use  . Smoking status: Former Research scientist (life sciences)  . Smokeless tobacco: Former Network engineer Use Topics  . Alcohol use: Never    Frequency: Never  . Drug use: Never    Current Outpatient Medications:  .  allopurinol (ZYLOPRIM) 300 MG tablet, Take 1 tablet (300 mg total) by mouth daily. Take 300 mg by mouth daily., Disp: 90 tablet, Rfl: 1 .  amLODipine (NORVASC) 5 MG tablet, Take 1 tablet (5 mg total) by mouth daily., Disp: 90 tablet, Rfl: 1 .  atenolol (TENORMIN) 50 MG tablet, Take 1 tablet (50 mg total) by mouth daily., Disp: 90 tablet, Rfl: 1 .  fluticasone (FLONASE) 50 MCG/ACT nasal spray, Place 2 sprays into both nostrils daily. Use for 4-6 weeks then stop and use seasonally or as needed., Disp: 16 g, Rfl: 3 .  lisinopril (PRINIVIL,ZESTRIL) 40 MG tablet, Take 1 tablet (40 mg total)  by mouth daily., Disp: 90 tablet, Rfl: 1 .  meloxicam (MOBIC) 7.5 MG tablet, Take 1 tablet (7.5 mg total) by mouth daily as needed for pain., Disp: 90 tablet, Rfl: 1  Depression screen [facility] 2/9 11/08/2018 11/04/2018 07/03/2018  Decreased Interest 0 0 0  Down, Depressed, Hopeless 0  0 0  PHQ - 2 Score 0 0 0    No flowsheet data found.  -------------------------------------------------------------------------- O: No physical exam performed due to remote telephone encounter.  Lab results reviewed.  I have personally reviewed the radiology report from 07/03/18 reviewed again today.  EXAM: RIGHT KNEE - COMPLETE 4+ VIEW;  BILATERAL KNEES STANDING - 1 VIEW  COMPARISON: None.  FINDINGS: Standing views of both knees demonstrate moderate to severe bilateral medial compartment knee joint space loss, but worse on the right- approaching a bone-on-bone.  Tricompartmental right knee joint degenerative spurring, also moderate to severe in the patellofemoral compartment. Probable small joint effusion. No acute osseous abnormality identified. Mild calcified peripheral vascular disease.  Incidental small 8 millimeter retained radiopaque foreign body in the subcutaneous fat medial to the left knee.  IMPRESSION: Right greater than left knee joint degeneration, severe in the medial and patellofemoral compartments.  Possible small right knee joint effusion but no acute osseous abnormality identified.   Electronically Signed By: Genevie Ann M.D. On: 07/03/2018 14:48   No results found for this or any previous visit (from the past 2160 hour(s)).  -------------------------------------------------------------------------- A&P:  Problem List Items Addressed This Visit    Benign hypertension with CKD (chronic kidney disease) stage III ([place])    Well-controlled HTN - Home BP readings reportedly normal Complicated by CKD-III    Plan:  1. Continue current BP regimen - Lisinopril 40mg , Atenolol 50mg , Amlodipine 5mg  daily - refill 2. Encourage improved lifestyle - low sodium diet, regular exercise as tolerated 3. Continue monitor BP outside office, bring readings to next visit, if persistently >140/90 or new symptoms notify office sooner 4. Follow-up in 5 months for  annual and labs  Recommend future switch from Lisinopril to Losartan - lower gout risk on Losartan      Relevant Medications   lisinopril (PRINIVIL,ZESTRIL) 40 MG tablet   amLODipine (NORVASC) 5 MG tablet   atenolol (TENORMIN) 50 MG tablet   CKD (chronic kidney disease), stage III (H[facility])    Stable CKD, Cr on last lab On medication management, controlled HTN, on ACEi Follow-up      Relevant Medications   lisinopril (PRINIVIL,ZESTRIL) 40 MG tablet   Gout    Stable controlled without flare Likely chronic gouty arthritis contributing to chronic knee issues Last uric acid 5.1 (02/2018)  Continue on Allopurinol 300mg  daily, re order today Avoid food triggers  Recheck lab with tests in 5 months      Relevant Medications   meloxicam (MOBIC) 7.5 MG tablet   allopurinol (ZYLOPRIM) 300 MG tablet   Osteoarthritis of multiple joints    Persistent chronic R > L generalized Knee pain, with known advanced osteoarthritis DJD No new injury or problem No prior history of knee surgery, arthroscopy Partial improvement on med management Last X-ray 06/2018 reviewed  Plan: Again offered steroid knee injection if interested in future - prefer to wait until reduce risk of COVID19 but may offer anytime if interested - He declined at this time.  1. Refill Meloxicam 7.5mg  daily PRN - advised avoid daily use due to CKD, take intermittently 2. Continue Tylenol high dose 1g TID PRN 3. RICE therapy (rest, ice, compression, elevation) for  swelling, activity modification  Follow-up sooner as need if not improve - consider refer to Ortho or injections      Relevant Medications   meloxicam (MOBIC) 7.5 MG tablet   allopurinol (ZYLOPRIM) 300 MG tablet      Meds ordered this encounter  Medications  . meloxicam (MOBIC) 7.5 MG tablet    Sig: Take 1 tablet (7.5 mg total) by mouth daily as needed for pain.    Dispense:  90 tablet    Refill:  1  . allopurinol (ZYLOPRIM) 300 MG tablet    Sig: Take 1  tablet (300 mg total) by mouth daily. Take 300 mg by mouth daily.    Dispense:  90 tablet    Refill:  1  . lisinopril (PRINIVIL,ZESTRIL) 40 MG tablet    Sig: Take 1 tablet (40 mg total) by mouth daily.    Dispense:  90 tablet    Refill:  1  . amLODipine (NORVASC) 5 MG tablet    Sig: Take 1 tablet (5 mg total) by mouth daily.    Dispense:  90 tablet    Refill:  1  . atenolol (TENORMIN) 50 MG tablet    Sig: Take 1 tablet (50 mg total) by mouth daily.    Dispense:  90 tablet    Refill:  1    Follow-up: - Return in 5 months for annual phys - Future labs ordered for 04/2019 approx  Patient verbalizes understanding with the above medical recommendations including the limitation of remote medical advice.  Specific follow-up and call-back criteria were given for patient to follow-up or seek medical care more urgently if needed.   - Time spent in direct consultation with patient on phone: 8 minutes  Nobie Putnam, [facility] 11/08/2018, 9:24 AM

## 2018-11-08 NOTE — Assessment & Plan Note (Signed)
Well-controlled HTN - Home BP readings reportedly normal Complicated by CKD-III    Plan:  1. Continue current BP regimen - Lisinopril 40mg , Atenolol 50mg , Amlodipine 5mg  daily - refill 2. Encourage improved lifestyle - low sodium diet, regular exercise as tolerated 3. Continue monitor BP outside office, bring readings to next visit, if persistently >140/90 or new symptoms notify office sooner 4. Follow-up in 5 months for annual and labs  Recommend future switch from Lisinopril to Losartan - lower gout risk on Losartan

## 2018-11-19 ENCOUNTER — Telehealth: Payer: Self-pay

## 2018-11-19 ENCOUNTER — Other Ambulatory Visit: Payer: Self-pay | Admitting: Family Medicine

## 2018-11-19 DIAGNOSIS — R059 Cough, unspecified: Secondary | ICD-10-CM

## 2018-11-19 DIAGNOSIS — R05 Cough: Secondary | ICD-10-CM

## 2018-11-19 DIAGNOSIS — J019 Acute sinusitis, unspecified: Secondary | ICD-10-CM

## 2018-11-19 MED ORDER — BENZONATATE 100 MG PO CAPS: 100.0000 mg | ORAL_CAPSULE | Freq: Three times a day (TID) | ORAL | 0 refills | Status: DC | PRN

## 2018-11-19 NOTE — Telephone Encounter (Signed)
Sent rx  Nobie Putnam, [facility] 11/19/2018, 1:56 PM

## 2018-11-19 NOTE — Telephone Encounter (Signed)
[facility] called because patient is there reporting that tessalon peals should have been sent in for him.  They do not have rx.  Can you please send to [facility]

## 2018-12-26 IMAGING — DX DG CHEST 2V
2 series · 2 of 2 positions shown · non-contrast
Comparison: None.

CLINICAL DATA: Cough.  Acute bronchitis.  Rhonchi.

EXAM:
CHEST - 2 VIEW

[chest pa]
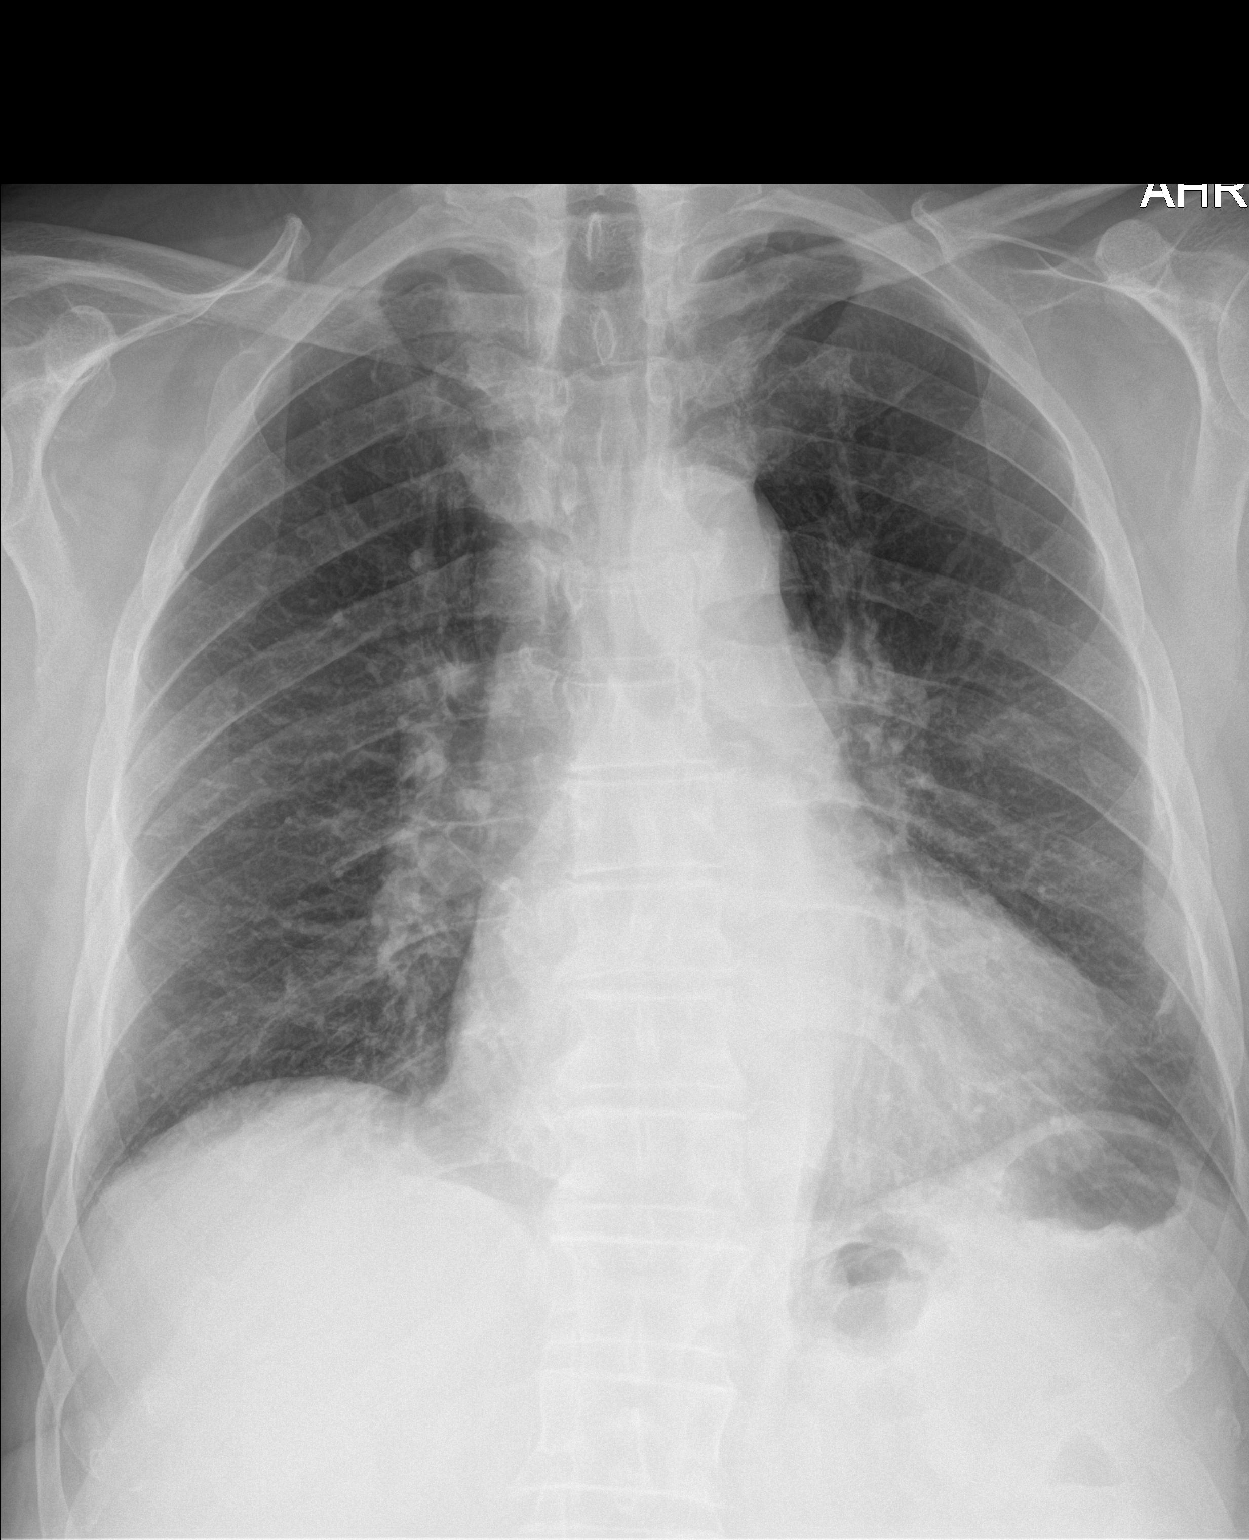

[chest lat]
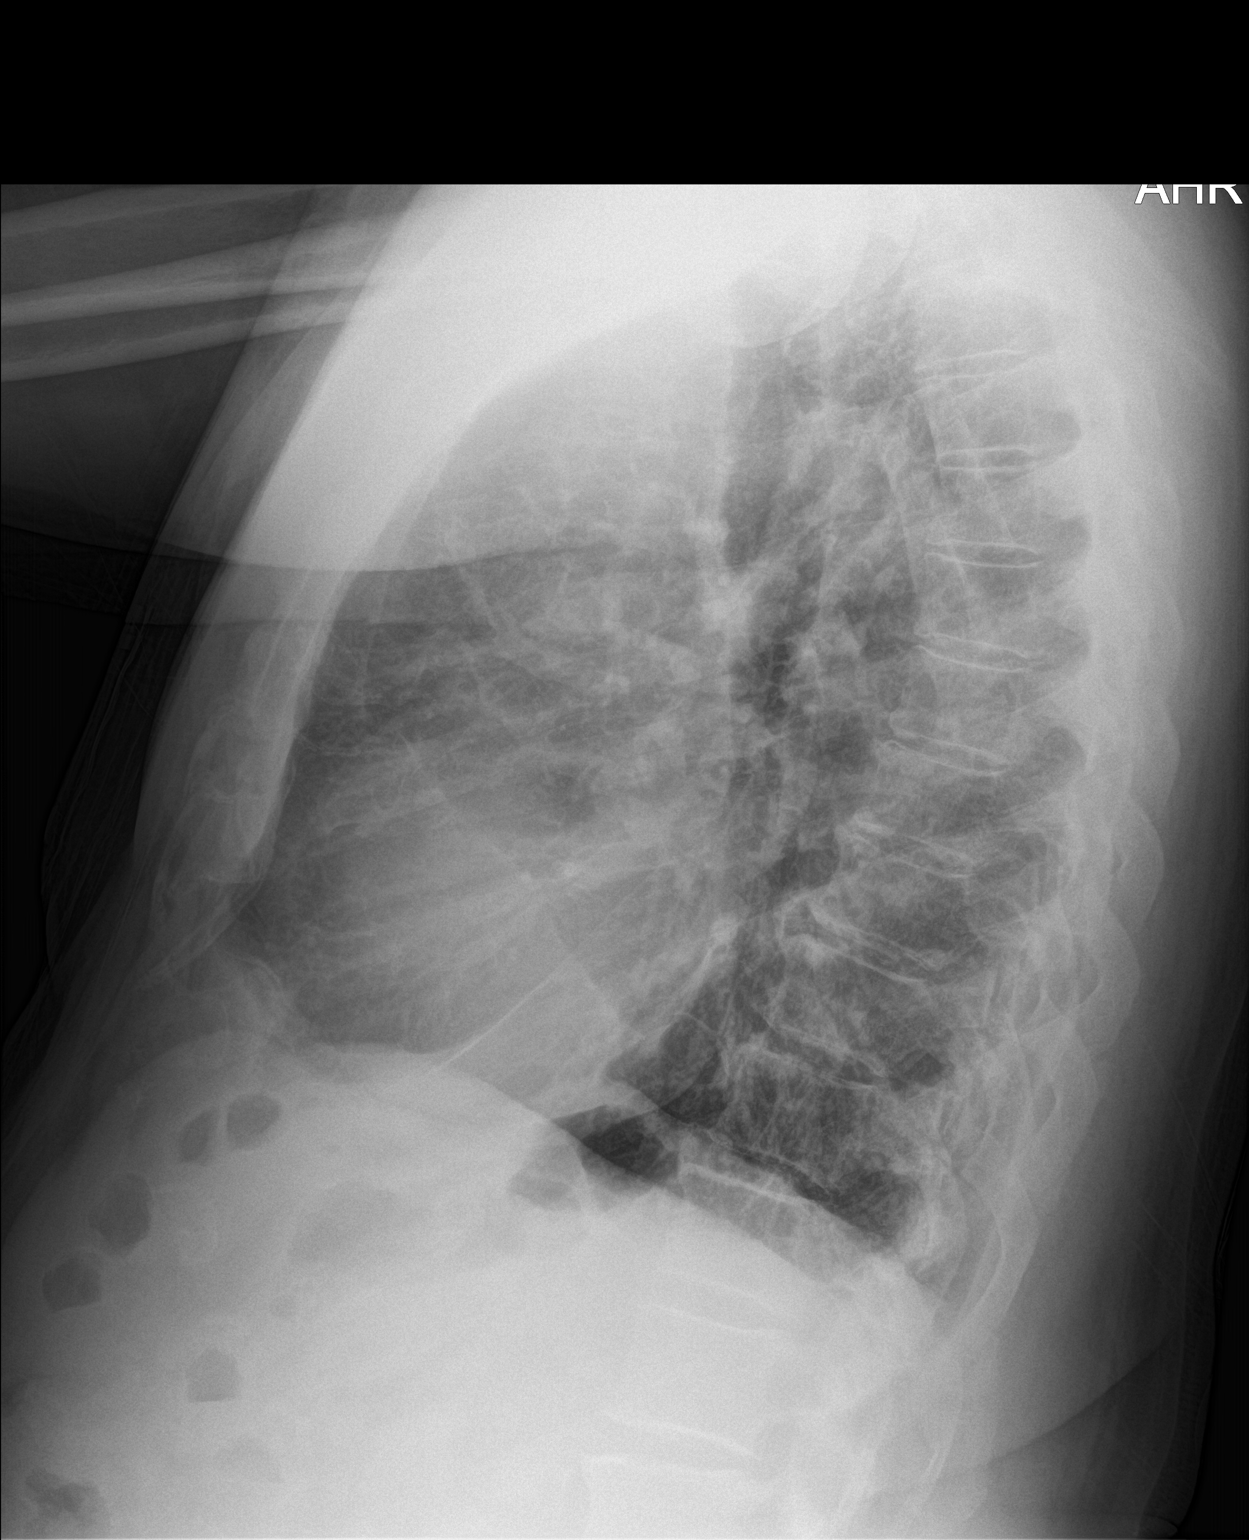

[2 of 2 positions shown; findings below may reference images not displayed]

FINDINGS: Mild enlargement of the cardiopericardial silhouette, without edema.
Central airway thickening. Subsegmental atelectasis or scarring in
the vicinity of the lingula. Atherosclerotic calcification of the
aortic arch.

No pleural effusion. The lungs appear otherwise clear. Thoracic
spondylosis.
IMPRESSION: 1. Airway thickening is present, suggesting bronchitis or reactive
airways disease.
2. Mild enlargement of the cardiopericardial silhouette, without
edema.
3.  Aortic Atherosclerosis ([id number]).

## 2019-02-12 ENCOUNTER — Other Ambulatory Visit: Payer: Self-pay

## 2019-02-12 ENCOUNTER — Ambulatory Visit: Payer: Medicare Other | Admitting: Family Medicine

## 2019-02-14 ENCOUNTER — Other Ambulatory Visit: Payer: Self-pay

## 2019-02-14 ENCOUNTER — Ambulatory Visit (INDEPENDENT_AMBULATORY_CARE_PROVIDER_SITE_OTHER): Payer: Medicare Other | Admitting: Family Medicine

## 2019-02-14 ENCOUNTER — Encounter: Payer: Self-pay | Admitting: Family Medicine

## 2019-02-14 DIAGNOSIS — R195 Other fecal abnormalities: Secondary | ICD-10-CM | POA: Diagnosis not present

## 2019-02-14 DIAGNOSIS — L2089 Other atopic dermatitis: Secondary | ICD-10-CM

## 2019-02-14 MED ORDER — TRIAMCINOLONE ACETONIDE 0.5 % EX CREA: 1.0000 "application " | TOPICAL_CREAM | Freq: Two times a day (BID) | CUTANEOUS | 1 refills | Status: DC

## 2019-02-14 NOTE — Progress Notes (Signed)
Subjective:    Patient ID: Robert [name], male    DOB: [birth year], 85 y.o.   MRN: [id number]  Robert [name] is a 85 y.o. male presenting on 02/14/2019 for Rash (Right arm and elbow. very itchy, reddish, slightly raised, and smooth x 1.5 -2 weeks.The pt currently treating the rash with calmine lotion ) and Gas (with loose stool x 2-3 days, denies nausea and vomiting )  Virtual / Telehealth Encounter - Video Visit via [employer] The purpose of this virtual visit is to provide medical care while limiting exposure to the novel coronavirus (COVID19) for both patient and office staff.  Consent was obtained for remote visit:  Yes.   Answered questions that patient had about telehealth interaction:  Yes.   I discussed the limitations, risks, security and privacy concerns of performing an evaluation and management service by video/telephone. I also discussed with the patient that there may be a patient responsible charge related to this service. The patient expressed understanding and agreed to proceed.  Patient Location: Home Provider Location: [facility] (Office)   HPI   History provided by patient and granddaughter present during video visit.  RIGHT FOREARM RASH Reports new problem onset 1-2 week ago without any trigger or exposure to cause onset, slightly red but raised dry flaky and itchy rash on R forearm mostly some on Left, he has been scratching it, tried calamine lotion and moisturizer cream. Not tried topical steroid No exposure to poison ivy Denies any fever chills sweats drainage of pus or ulceration or bleeding, no other rash or extension  LOOSE STOOLS / GAS Additionally admits loose stools, gas, no change in diet recently. Not diarrhea but looser stool. Denies abdominal pain, dark stool, blood in stool   Depression screen [facility] 2/9 11/08/2018 11/04/2018 07/03/2018  Decreased Interest 0 0 0  Down, Depressed, Hopeless 0 0 0  PHQ - 2 Score 0 0 0    Social History    Tobacco Use  . Smoking status: Former Research scientist (life sciences)  . Smokeless tobacco: Former Network engineer Use Topics  . Alcohol use: Never    Frequency: Never  . Drug use: Never    Review of Systems Per HPI unless specifically indicated above     Objective:    There were no vitals taken for this visit.  Wt Readings from Last 3 Encounters:  07/03/18 222 lb 6.4 oz (100.9 kg)  03/05/18 222 lb 9.6 oz (101 kg)  02/22/18 223 lb 3.2 oz (101.2 kg)    Physical Exam   Note examination was completely remotely via video observation objective data only  Gen - well-appearing, no acute distress or apparent pain, comfortable HEENT - eyes appear clear without discharge or redness Heart/Lungs - cannot examine virtually - observed no evidence of coughing or labored breathing. Abd - cannot examine virtually  Skin - right forearm with dry flaky patchy skin appearance with evidence of scratching, some raised patches consistent with eczema Neuro - awake, alert, oriented Psych - not anxious appearing  Pictured viewed of rash sent yesterday through secure email to office.   Results for orders placed or performed in visit on 02/28/18  Uric acid  Result Value Ref Range   Uric Acid, Serum 5.1 4.0 - 8.0 mg/dL  PSA, Total with Reflex to PSA, Free  Result Value Ref Range   PSA, Total 0.5 < OR = 4.0 ng/mL  Lipid panel  Result Value Ref Range   Cholesterol 168 <200 mg/dL   HDL 40 (  L) >40 mg/dL   Triglycerides 95 <150 mg/dL   LDL Cholesterol (Calc) 109 (H) mg/dL (calc)   Total CHOL/HDL Ratio 4.2 <5.0 (calc)   Non-HDL Cholesterol (Calc) 128 <130 mg/dL (calc)  COMPLETE METABOLIC PANEL WITH GFR  Result Value Ref Range   Glucose, Bld 112 (H) 65 - 99 mg/dL   BUN 19 7 - 25 mg/dL   Creat 1.28 (H) 0.70 - 1.11 mg/dL   GFR, Est Non African American 51 (L) > OR = 60 mL/min/1.75m2   GFR, Est African American 59 (L) > OR = 60 mL/min/1.43m2   BUN/Creatinine Ratio 15 6 - 22 (calc)   Sodium 140 135 - 146 mmol/L    Potassium 5.1 3.5 - 5.3 mmol/L   Chloride 105 98 - 110 mmol/L   CO2 27 20 - 32 mmol/L   Calcium 9.3 8.6 - 10.3 mg/dL   Total Protein 6.8 6.1 - 8.1 g/dL   Albumin 4.4 3.6 - 5.1 g/dL   Globulin 2.4 1.9 - 3.7 g/dL (calc)   AG Ratio 1.8 1.0 - 2.5 (calc)   Total Bilirubin 0.5 0.2 - 1.2 mg/dL   Alkaline phosphatase (APISO) 64 40 - 115 U/L   AST 18 10 - 35 U/L   ALT 10 9 - 46 U/L  CBC with Differential/Platelet  Result Value Ref Range   WBC 16.1 (H) 3.8 - 10.8 Thousand/uL   RBC 5.52 4.20 - 5.80 Million/uL   Hemoglobin 12.7 (L) 13.2 - 17.1 g/dL   HCT 40.0 38.5 - 50.0 %   MCV 72.5 (L) 80.0 - 100.0 fL   MCH 23.0 (L) 27.0 - 33.0 pg   MCHC 31.8 (L) 32.0 - 36.0 g/dL   RDW 19.3 (H) 11.0 - 15.0 %   Platelets 227 140 - 400 Thousand/uL   MPV  7.5 - 12.5 fL   Neutro Abs 4,283 1,500 - 7,800 cells/uL   Lymphs Abs 9,563 (H) 850 - 3,900 cells/uL   WBC mixed population 1,771 (H) 200 - 950 cells/uL   Eosinophils Absolute 354 15 - 500 cells/uL   Basophils Absolute 129 0 - 200 cells/uL   Neutrophils Relative % 26.6 %   Total Lymphocyte 59.4 %   Monocytes Relative 11.0 %   Eosinophils Relative 2.2 %   Basophils Relative 0.8 %   Smear Review    Hemoglobin A1c  Result Value Ref Range   Hgb A1c MFr Bld 6.3 (H) <5.7 % of total Hgb   Mean Plasma Glucose 134 (calc)   eAG (mmol/L) 7.4 (calc)      Assessment & Plan:   Problem List Items Addressed This Visit    None    Visit Diagnoses    Other atopic dermatitis    -  Primary   Relevant Medications   triamcinolone cream (KENALOG) 0.5 %   Loose stools          Clinically for rash seems atopic in appearance, with significant scratching done by patient No sign secondary infection No obvious trigger by history, seems fairly localized Trial on topical steroid Triamcinolone 0.5% cream BID for 1-2 weeks Use topical moisturizer still Avoid or limit scratching F/u if not resolved  For loose stools Seems functional GI issue No GI red flag symptoms  Start Metamucil fiber supplement to bulk stool and improve bowel regularity Can try Gas-X OTC  Meds ordered this encounter  Medications  . triamcinolone cream (KENALOG) 0.5 %    Sig: Apply 1 application topically 2 (two) times daily. To affected  areas, for up to 2 weeks.    Dispense:  30 g    Refill:  1    Follow-up: - Return as needed 1-2 weeks if not improved  Patient verbalizes understanding with the above medical recommendations including the limitation of remote medical advice.  Specific follow-up and call-back criteria were given for patient to follow-up or seek medical care more urgently if needed.  Total duration of direct patient care provided via video conference: 15 minutes  Nobie Putnam, [facility] 02/14/2019, 10:32 AM

## 2019-02-14 NOTE — Patient Instructions (Addendum)
The rash looks most consistent with eczema, this can flare up and get worse due to a variety of factors (excessive dry skin from bathing/showering, soaps, cold weather / indoor heaters, outdoor exposures).  Use the topical steroid creams twice a day for up to 1 week, maximum duration of use per one flare is 10 to 14 days, then STOP using it and allow skin to recover. Caution with over-use may cause lightening of the skin.   Hydrocortisone on face only and the Triamcinolone / Kenalog on body only.  Tips to reduce Eczema Flares: For baths/showers, limit bathing to every other day if you can (max 1 x daily)  Use a gentle, unscented soap and lukewarm water (hot water is most irritating to skin) Never scrub skin with too much pressure, this causes more irritation. Pat skin dry, then leave it slightly damp. DO NOT scrub it dry. Apply steroid cream to skin and rub in all the way, wait 15 min, then apply a daily moisturizer (Vaseline, Eucerin, Aveeno). Continue daily moisturizer every day of the year (even after flare is resolved) - If you have eczema on hands or dry hands, recommend wearing any type of gloves overnight (cloth, fabric, or even nitrile/latex) to improve effect of topical moisturizer  If develops redness, honey colored crust oozing, drainage of pus, bleeding, or redness / swelling, pain, please return for re-evaluation, may have become infected after scratching.   ----- Try metamucil fiber supplement to bulk stool up and have more regularity  May try OTC Gas-X  Please schedule a Follow-up Appointment to: Return in about 2 weeks (around 02/28/2019), or if symptoms worsen or fail to improve, for rash, loose stool.  If you have any other questions or concerns, please feel free to call the office or send a message through [place]. You may also schedule an earlier appointment if necessary.  Additionally, you may be receiving a survey about your experience at our office within a few days to  1 week by e-mail or mail. We value your feedback.  Nobie Putnam, DO [place]

## 2019-05-15 ENCOUNTER — Telehealth: Payer: Self-pay | Admitting: Family Medicine

## 2019-05-15 NOTE — Telephone Encounter (Signed)
Pt called requesting refill on ALL his medication if they are not going to be called in please contact pt granddaughter

## 2019-05-15 NOTE — Telephone Encounter (Signed)
Left message twice no VM set up.

## 2019-05-16 ENCOUNTER — Other Ambulatory Visit: Payer: Self-pay | Admitting: Family Medicine

## 2019-05-16 ENCOUNTER — Other Ambulatory Visit: Payer: Self-pay

## 2019-05-16 DIAGNOSIS — M1A9XX0 Chronic gout, unspecified, without tophus (tophi): Secondary | ICD-10-CM

## 2019-05-16 DIAGNOSIS — J00 Acute nasopharyngitis [common cold]: Secondary | ICD-10-CM

## 2019-05-16 DIAGNOSIS — I129 Hypertensive chronic kidney disease with stage 1 through stage 4 chronic kidney disease, or unspecified chronic kidney disease: Secondary | ICD-10-CM

## 2019-05-16 DIAGNOSIS — N183 Chronic kidney disease, stage 3 unspecified: Secondary | ICD-10-CM

## 2019-05-16 DIAGNOSIS — L2089 Other atopic dermatitis: Secondary | ICD-10-CM

## 2019-05-16 NOTE — Telephone Encounter (Signed)
The pt grandaughter called very outrage because she had to call back to request her grandfather medication. I ask her what medication does her grandfather need refilled and she said don't you all know what he is taking. She then started screaming he need his blood pressure medication and his gout medication.  I told her I'm trying to help you, but I will not be screamed at. She then said they are stupid and we will just wait until Monday and hung the phone up.Robert [name]

## 2019-05-16 NOTE — Telephone Encounter (Signed)
The pt grandaughter called very outrage because she had to call back to request her grandfather medication. I ask her what medication does her grandfather need refilled and she said don't you all know what he is taking. She then started screaming he need his blood pressure medication and his gout medication.  I told her I'm trying to help you, but I will not be screamed at. She then proceed to say to someone in the background they are stupid and we will just wait until Monday and hung the phone up.

## 2019-05-26 ENCOUNTER — Ambulatory Visit (INDEPENDENT_AMBULATORY_CARE_PROVIDER_SITE_OTHER): Payer: Medicare Other | Admitting: Family Medicine

## 2019-05-26 ENCOUNTER — Other Ambulatory Visit: Payer: Self-pay

## 2019-05-26 VITALS — BP 140/82 | HR 68 | Resp 16 | Ht 67.0 in | Wt 211.8 lb

## 2019-05-26 DIAGNOSIS — M8949 Other hypertrophic osteoarthropathy, multiple sites: Secondary | ICD-10-CM

## 2019-05-26 DIAGNOSIS — M159 Polyosteoarthritis, unspecified: Secondary | ICD-10-CM

## 2019-05-26 DIAGNOSIS — G8929 Other chronic pain: Secondary | ICD-10-CM

## 2019-05-26 DIAGNOSIS — L2089 Other atopic dermatitis: Secondary | ICD-10-CM

## 2019-05-26 DIAGNOSIS — M1A0610 Idiopathic chronic gout, right knee, without tophus (tophi): Secondary | ICD-10-CM

## 2019-05-26 DIAGNOSIS — N183 Chronic kidney disease, stage 3 unspecified: Secondary | ICD-10-CM

## 2019-05-26 DIAGNOSIS — M25561 Pain in right knee: Secondary | ICD-10-CM

## 2019-05-26 DIAGNOSIS — I872 Venous insufficiency (chronic) (peripheral): Secondary | ICD-10-CM

## 2019-05-26 DIAGNOSIS — R7303 Prediabetes: Secondary | ICD-10-CM

## 2019-05-26 DIAGNOSIS — I129 Hypertensive chronic kidney disease with stage 1 through stage 4 chronic kidney disease, or unspecified chronic kidney disease: Secondary | ICD-10-CM

## 2019-05-26 DIAGNOSIS — M150 Primary generalized (osteo)arthritis: Secondary | ICD-10-CM

## 2019-05-26 MED ORDER — BETAMETHASONE VALERATE 0.1 % EX OINT: 1.0000 "application " | TOPICAL_OINTMENT | Freq: Two times a day (BID) | CUTANEOUS | 1 refills | Status: DC | PRN

## 2019-05-26 NOTE — Assessment & Plan Note (Signed)
Persistent chronic R > L generalized Knee pain, with known advanced osteoarthritis DJD With some inc swelling No new injury or problem No prior history of knee surgery, arthroscopy Partial improvement on med management Last X-ray 06/2018 reviewed  Plan: Again offered steroid knee injection if interested in future - he will plan to return for visit for injection in near future, declined today  Will check labs today, including uric acid Continue Meloxicam If chemistry Creatinine is stable we can add Furosemide / Lasix PRN for edema in his R knee and lower leg to help improve as PRN med  Continue Tylenol high dose 1g TID PRN RICE therapy (rest, ice, compression, elevation) for swelling, activity modification  If injection ineffective or only short duration will consider referral to Orthopedics

## 2019-05-26 NOTE — Patient Instructions (Addendum)
Thank you for coming to the office today.  CPT 20610 - Injection of Large Joint - Corticosteroid Knee Joint Injection (Right Knee, medicine is Depo-Medrol + Lidocaine)  Labs today, stay tuned for results.  We can likely add a Fluid Pill (Furosemide) Lasix as needed in the future for swelling. If kidney are healthy enough.  Start new stronger ointment for R arm itchy dry skin - use twice a day, after shower and before bed, keep covered at night, use for 2 weeks then break for 1 week then use again, if not helping can refer to Dermatologist  Knee injection as advised when you return.  Please schedule a Follow-up Appointment to: Return in about 1 week (around 06/02/2019), or if symptoms worsen or fail to improve, for knee injection.  If you have any other questions or concerns, please feel free to call the office or send a message through [place]. You may also schedule an earlier appointment if necessary.  Additionally, you may be receiving a survey about your experience at our office within a few days to 1 week by e-mail or mail. We value your feedback.  Nobie Putnam, DO [place]

## 2019-05-26 NOTE — Progress Notes (Signed)
Subjective:    Patient ID: Robert [name], male    DOB: July 18, 1932, 85 y.o.   MRN: [id number]  Robert [name] is a 85 y.o. male presenting on 05/26/2019 for Knee Pain (right ) and Rash (Both arms )   HPI   Follow-up Knee Pain /Bilateral Gout Arthritis - Last visit with 11/2018 and 02/2019, he had previous 06/2018 x-rays knees that showed significant moderate to severe R>L DJD see result and he was started on meloxicam 7.5mg  daily PRN pain and offered injections, see prior notes for background information. - Interval update withseems persistent knee pain without improvement or worsening - Today patient is interested in knee injection in the future to learn more about it, still problem with R knee primarily due to pain and arthritis, now some swelling - No prior knee injection in past. - Taking Allopurinol, no new gout flares - Taking Meloxicam PRN - He has chronic known knee issues >10 years - Last update with Uric Acid 5.1, recently checked normal range. He takes Allopurinol 300mg  - Taking Meloxicam 7.5mg  PRN, Tylenol - occasionally Denies injury recent fall or trauma, redness, swelling, numb tingling  Pre-Diabetes Due for A1c upcoming, will get blood work. No new concerns today.  Peripheral Edema / Venous Stasis Chronic problem now seems to be worsening R>L lower extremity with edema and chronic skin changes. Seems to affect the swelling in his R knee, see above. Not on diuretic.  Health Maintenance: UTD Flu vaccine 05/14/19  Depression screen [facility] 2/9 11/08/2018 11/04/2018 07/03/2018  Decreased Interest 0 0 0  Down, Depressed, Hopeless 0 0 0  PHQ - 2 Score 0 0 0    Social History   Tobacco Use  . Smoking status: Former Research scientist (life sciences)  . Smokeless tobacco: Former Network engineer Use Topics  . Alcohol use: Never    Frequency: Never  . Drug use: Never    Review of Systems Per HPI unless specifically indicated above     Objective:    BP 140/82 (BP Location: Right Arm, Patient  Position: Sitting, Cuff Size: Normal)   Pulse 68   Resp 16   Ht 5\' 7"  (1.702 m)   Wt 211 lb 12.8 oz (96.1 kg)   BMI 33.17 kg/m   Wt Readings from Last 3 Encounters:  05/26/19 211 lb 12.8 oz (96.1 kg)  07/03/18 222 lb 6.4 oz (100.9 kg)  03/05/18 222 lb 9.6 oz (101 kg)    Physical Exam Vitals signs and nursing note reviewed.  Constitutional:      General: He is not in acute distress.    Appearance: He is well-developed. He is not diaphoretic.     Comments: Well-appearing, comfortable, cooperative  HENT:     Head: Normocephalic and atraumatic.  Eyes:     General:        Right eye: No discharge.        Left eye: No discharge.     Conjunctiva/sclera: Conjunctivae normal.  Neck:     Musculoskeletal: Normal range of motion and neck supple.     Thyroid: No thyromegaly.  Cardiovascular:     Rate and Rhythm: Normal rate and regular rhythm.     Heart sounds: Normal heart sounds. No murmur.  Pulmonary:     Effort: Pulmonary effort is normal. No respiratory distress.     Breath sounds: Normal breath sounds. No wheezing or rales.  Musculoskeletal: Normal range of motion.     Right lower leg: Edema (+1 pitting edema lower extremity, some changes  of chronic venous stasis) present.     Left lower leg: Edema (trace) present.     Comments: Right knee, with some mild effusion compared to Left Good range of motion but notable crepitus on exam Mild tender medial joint line Similar to prior exam  Lymphadenopathy:     Cervical: No cervical adenopathy.  Skin:    General: Skin is warm and dry.     Findings: No erythema or rash.     Comments: R forearm with large area of dry flaking skin without other rash. Similar lower extremity dry skin and chronic stasis changes  Neurological:     Mental Status: He is alert and oriented to person, place, and time.  Psychiatric:        Behavior: Behavior normal.     Comments: Well groomed, good eye contact, normal speech and thoughts    Results for  orders placed or performed in visit on 02/28/18  Uric acid  Result Value Ref Range   Uric Acid, Serum 5.1 4.0 - 8.0 mg/dL  PSA, Total with Reflex to PSA, Free  Result Value Ref Range   PSA, Total 0.5 < OR = 4.0 ng/mL  Lipid panel  Result Value Ref Range   Cholesterol 168 <200 mg/dL   HDL 40 (L) >40 mg/dL   Triglycerides 95 <150 mg/dL   LDL Cholesterol (Calc) 109 (H) mg/dL (calc)   Total CHOL/HDL Ratio 4.2 <5.0 (calc)   Non-HDL Cholesterol (Calc) 128 <130 mg/dL (calc)  COMPLETE METABOLIC PANEL WITH GFR  Result Value Ref Range   Glucose, Bld 112 (H) 65 - 99 mg/dL   BUN 19 7 - 25 mg/dL   Creat 1.28 (H) 0.70 - 1.11 mg/dL   GFR, Est Non African American 51 (L) > OR = 60 mL/min/1.107m2   GFR, Est African American 59 (L) > OR = 60 mL/min/1.75m2   BUN/Creatinine Ratio 15 6 - 22 (calc)   Sodium 140 135 - 146 mmol/L   Potassium 5.1 3.5 - 5.3 mmol/L   Chloride 105 98 - 110 mmol/L   CO2 27 20 - 32 mmol/L   Calcium 9.3 8.6 - 10.3 mg/dL   Total Protein 6.8 6.1 - 8.1 g/dL   Albumin 4.4 3.6 - 5.1 g/dL   Globulin 2.4 1.9 - 3.7 g/dL (calc)   AG Ratio 1.8 1.0 - 2.5 (calc)   Total Bilirubin 0.5 0.2 - 1.2 mg/dL   Alkaline phosphatase (APISO) 64 40 - 115 U/L   AST 18 10 - 35 U/L   ALT 10 9 - 46 U/L  CBC with Differential/Platelet  Result Value Ref Range   WBC 16.1 (H) 3.8 - 10.8 Thousand/uL   RBC 5.52 4.20 - 5.80 Million/uL   Hemoglobin 12.7 (L) 13.2 - 17.1 g/dL   HCT 40.0 38.5 - 50.0 %   MCV 72.5 (L) 80.0 - 100.0 fL   MCH 23.0 (L) 27.0 - 33.0 pg   MCHC 31.8 (L) 32.0 - 36.0 g/dL   RDW 19.3 (H) 11.0 - 15.0 %   Platelets 227 140 - 400 Thousand/uL   MPV  7.5 - 12.5 fL   Neutro Abs 4,283 1,500 - 7,800 cells/uL   Lymphs Abs 9,563 (H) 850 - 3,900 cells/uL   WBC mixed population 1,771 (H) 200 - 950 cells/uL   Eosinophils Absolute 354 15 - 500 cells/uL   Basophils Absolute 129 0 - 200 cells/uL   Neutrophils Relative % 26.6 %   Total Lymphocyte 59.4 %   Monocytes Relative 11.0 %  Eosinophils  Relative 2.2 %   Basophils Relative 0.8 %   Smear Review    Hemoglobin A1c  Result Value Ref Range   Hgb A1c MFr Bld 6.3 (H) <5.7 % of total Hgb   Mean Plasma Glucose 134 (calc)   eAG (mmol/L) 7.4 (calc)      Assessment & Plan:   Problem List Items Addressed This Visit    Benign hypertension with CKD (chronic kidney disease) stage III   Relevant Orders   COMPLETE METABOLIC PANEL WITH GFR   CBC with Differential/Platelet   Chronic venous insufficiency See A&P Likely contributing to knee swelling    Gout Continue current Allopurinol 300mg  daily Check uric acid to determine chronic management/control - may adjust dose lower in future if needed     Relevant Orders   Uric acid   Osteoarthritis of multiple joints Right Knee osteoarthritis moderate to severe    Persistent chronic R > L generalized Knee pain, with known advanced osteoarthritis DJD With some inc swelling No new injury or problem No prior history of knee surgery, arthroscopy Partial improvement on med management Last X-ray 06/2018 reviewed  Plan: Again offered steroid knee injection if interested in future - he will plan to return for visit for injection in near future, declined today  Will check labs today, including uric acid Continue Meloxicam If chemistry Creatinine is stable we can add Furosemide / Lasix PRN for edema in his R knee and lower leg to help improve as PRN med  Continue Tylenol high dose 1g TID PRN RICE therapy (rest, ice, compression, elevation) for swelling, activity modification  If injection ineffective or only short duration will consider referral to Orthopedics      Relevant Orders   CBC with Differential/Platelet   Pre-diabetes   Relevant Orders   Hemoglobin A1c    Other Visit Diagnoses    Chronic pain of right knee    -  Primary   Other atopic dermatitis       Relevant Medications   betamethasone valerate ointment (VALISONE) 0.1 %     #DC Triamcinolone and switch to higher  potency ointment instead of cream, use appropriate skin covering at night to help protect, avoid scratching Follow-up if not improving, can refer to Dermatology if indicated.     Meds ordered this encounter  Medications  . betamethasone valerate ointment (VALISONE) 0.1 %    Sig: Apply 1 application topically 2 (two) times daily as needed. For 2 weeks at a time, then as needed.    Dispense:  30 g    Refill:  1    Follow up plan: Return in about 1 week (around 06/02/2019), or if symptoms worsen or fail to improve, for knee injection.   Nobie Putnam, DO [facility] 05/26/2019, 10:05 AM

## 2019-05-27 ENCOUNTER — Encounter: Payer: Self-pay | Admitting: Family Medicine

## 2019-05-27 DIAGNOSIS — D729 Disorder of white blood cells, unspecified: Secondary | ICD-10-CM | POA: Insufficient documentation

## 2019-05-27 LAB — CBC WITH DIFFERENTIAL/PLATELET
Absolute Monocytes: 1572 cells/uL — ABNORMAL HIGH (ref 200–950)
Basophils Absolute: 118 cells/uL (ref 0–200)
Basophils Relative: 0.7 %
Eosinophils Absolute: 355 cells/uL (ref 15–500)
Eosinophils Relative: 2.1 %
HCT: 40.2 % (ref 38.5–50.0)
Hemoglobin: 12.7 g/dL — ABNORMAL LOW (ref 13.2–17.1)
Lymphs Abs: 9599 cells/uL — ABNORMAL HIGH (ref 850–3900)
MCH: 23.7 pg — ABNORMAL LOW (ref 27.0–33.0)
MCHC: 31.6 g/dL — ABNORMAL LOW (ref 32.0–36.0)
MCV: 75.1 fL — ABNORMAL LOW (ref 80.0–100.0)
MPV: 10.3 fL (ref 7.5–12.5)
Monocytes Relative: 9.3 %
Neutro Abs: 5256 cells/uL (ref 1500–7800)
Neutrophils Relative %: 31.1 %
Platelets: 247 10*3/uL (ref 140–400)
RBC: 5.35 10*6/uL (ref 4.20–5.80)
RDW: 18.5 % — ABNORMAL HIGH (ref 11.0–15.0)
Total Lymphocyte: 56.8 %
WBC: 16.9 10*3/uL — ABNORMAL HIGH (ref 3.8–10.8)

## 2019-05-27 LAB — COMPLETE METABOLIC PANEL WITH GFR
AG Ratio: 1.7 (calc) (ref 1.0–2.5)
ALT: 13 U/L (ref 9–46)
AST: 22 U/L (ref 10–35)
Albumin: 4.5 g/dL (ref 3.6–5.1)
Alkaline phosphatase (APISO): 86 U/L (ref 35–144)
BUN/Creatinine Ratio: 13 (calc) (ref 6–22)
BUN: 17 mg/dL (ref 7–25)
CO2: 24 mmol/L (ref 20–32)
Calcium: 9.4 mg/dL (ref 8.6–10.3)
Chloride: 108 mmol/L (ref 98–110)
Creat: 1.27 mg/dL — ABNORMAL HIGH (ref 0.70–1.11)
GFR, Est African American: 59 mL/min/{1.73_m2} — ABNORMAL LOW (ref >=60)
GFR, Est Non African American: 51 mL/min/{1.73_m2} — ABNORMAL LOW (ref >=60)
Globulin: 2.6 g/dL (calc) (ref 1.9–3.7)
Glucose, Bld: 103 mg/dL (ref 65–139)
Potassium: 4.1 mmol/L (ref 3.5–5.3)
Sodium: 143 mmol/L (ref 135–146)
Total Bilirubin: 1.6 mg/dL — ABNORMAL HIGH (ref 0.2–1.2)
Total Protein: 7.1 g/dL (ref 6.1–8.1)

## 2019-05-27 LAB — HEMOGLOBIN A1C
Hgb A1c MFr Bld: 5.6 % of total Hgb (ref <5.7)
Mean Plasma Glucose: 114 (calc)
eAG (mmol/L): 6.3 (calc)

## 2019-05-27 LAB — URIC ACID: Uric Acid, Serum: 5.5 mg/dL (ref 4.0–8.0)

## 2019-06-03 ENCOUNTER — Ambulatory Visit: Payer: Medicare Other | Admitting: Family Medicine

## 2019-06-03 ENCOUNTER — Ambulatory Visit (INDEPENDENT_AMBULATORY_CARE_PROVIDER_SITE_OTHER): Payer: Medicare Other

## 2019-06-03 DIAGNOSIS — Z0000 Encounter for general adult medical examination without abnormal findings: Secondary | ICD-10-CM | POA: Diagnosis not present

## 2019-06-03 NOTE — Progress Notes (Signed)
Subjective:   Robert [name] is a 85 y.o. male who presents for Medicare Annual/Subsequent preventive examination.  This visit is being conducted via phone call  - after an attmept to do on video chat - due to the COVID-19 pandemic. This patient has given me verbal consent via phone to conduct this visit, patient states they are participating from their home address. Some vital signs may be absent or patient reported.   Patient identification: identified by name, DOB, and current address.    Review of Systems:   Cardiac Risk Factors include: advanced age (>65men, >57 women);male gender;dyslipidemia;hypertension     Objective:    Vitals: There were no vitals taken for this visit.  There is no height or weight on file to calculate BMI.  Advanced Directives 06/03/2019  Does Patient Have a Medical Advance Directive? No    Tobacco Social History   Tobacco Use  Smoking Status Former Smoker  . Quit date: 63  . Years since quitting: 35.8  Smokeless Tobacco Never Used     Counseling given: Not Answered   Clinical Intake:  Pre-visit preparation completed: Yes  Pain : No/denies pain     Nutritional Risks: None Diabetes: No  How often do you need to have someone help you when you read instructions, pamphlets, or other written materials from your doctor or pharmacy?: 1 - Never  Interpreter Needed?: No  Information entered by :: Robert Hill,LPN  Past Medical History:  Diagnosis Date  . Gout   . Hyperlipidemia   . Hypertension    History reviewed. No pertinent surgical history. Family History  Family history unknown: Yes   Social History   Socioeconomic History  . Marital status: Widowed    Spouse name: Not on file  . Number of children: Not on file  . Years of education: Not on file  . Highest education level: Not on file  Occupational History  . Occupation: retired  Scientific laboratory technician  . Financial resource strain: Not hard at all  . Food insecurity    Worry:  Never true    Inability: Never true  . Transportation needs    Medical: No    Non-medical: No  Tobacco Use  . Smoking status: Former Smoker    Quit date: 1985    Years since quitting: 35.8  . Smokeless tobacco: Never Used  Substance and Sexual Activity  . Alcohol use: Never    Frequency: Never  . Drug use: Never  . Sexual activity: Not on file  Lifestyle  . Physical activity    Days per week: 0 days    Minutes per session: 0 min  . Stress: Not at all  Relationships  . Social connections    Talks on phone: More than three times a week    Gets together: More than three times a week    Attends religious service: More than 4 times per year    Active member of club or organization: No    Attends meetings of clubs or organizations: Never    Relationship status: Widowed  Other Topics Concern  . Not on file  Social History Narrative  . Not on file    Outpatient Encounter Medications as of 06/03/2019  Medication Sig  . allopurinol (ZYLOPRIM) 300 MG tablet Take 1 tablet (300 mg total) by mouth daily. Take 300 mg by mouth daily.  Marland Kitchen amLODipine (NORVASC) 5 MG tablet Take 1 tablet (5 mg total) by mouth daily.  Marland Kitchen atenolol (TENORMIN) 50 MG tablet  Take 1 tablet (50 mg total) by mouth daily.  . betamethasone valerate ointment (VALISONE) 0.1 % Apply 1 application topically 2 (two) times daily as needed. For 2 weeks at a time, then as needed.  Marland Kitchen lisinopril (ZESTRIL) 40 MG tablet Take 1 tablet (40 mg total) by mouth daily.  . meloxicam (MOBIC) 7.5 MG tablet Take 1 tablet (7.5 mg total) by mouth daily as needed for pain.  . fluticasone (FLONASE) 50 MCG/ACT nasal spray Place 2 sprays into both nostrils daily. Use for 4-6 weeks then stop and use seasonally or as needed. (Patient not taking: Reported on 06/03/2019)   No facility-administered encounter medications on file as of 06/03/2019.     Activities of Daily Living In your present state of health, do you have any difficulty performing the  following activities: 06/03/2019  Hearing? Y  Comment no hearing aids  Vision? N  Comment eyeglasses, [facility]  Difficulty concentrating or making decisions? N  Walking or climbing stairs? Y  Comment knee pain  Dressing or bathing? N  Doing errands, shopping? N  Preparing Food and eating ? N  Using the Toilet? N  In the past six months, have you accidently leaked urine? N  Do you have problems with loss of bowel control? N  Managing your Medications? N  Managing your Finances? N  Housekeeping or managing your Housekeeping? N  Some recent data might be hidden    Patient Care Team: Robert Hauser, DO as PCP - General (Family Medicine)   Assessment:   This is a routine wellness examination for Robert [name].  Exercise Activities and Dietary recommendations Current Exercise Habits: The patient does not participate in regular exercise at present, Exercise limited by: None identified  Goals   None     Fall Risk: Fall Risk  06/03/2019 11/08/2018 11/04/2018 07/03/2018 03/05/2018  Falls in the past year? 0 0 0 0 No  Number falls in past yr: 0 - - - -  Injury with Fall? 0 - - - -  Follow up - Falls evaluation completed Falls evaluation completed Falls evaluation completed -    FALL RISK PREVENTION PERTAINING TO THE HOME:  Any stairs in or around the home? No  If so, are there any without handrails? No   Home free of loose throw rugs in walkways, pet beds, electrical cords, etc? Yes  Adequate lighting in your home to reduce risk of falls? Yes   ASSISTIVE DEVICES UTILIZED TO PREVENT FALLS:  Life alert? No  Use of a cane, walker or w/c? No  Grab bars in the bathroom? No  Shower chair or bench in shower? No  Elevated toilet seat or a handicapped toilet? No   TIMED UP AND GO:  Unable to perform   Depression Screen PHQ 2/9 Scores 06/03/2019 11/08/2018 11/04/2018 07/03/2018  PHQ - 2 Score 0 0 0 0    Cognitive Function        Immunization History   Administered Date(s) Administered  . Influenza, High Dose Seasonal PF 06/08/2017, 07/03/2018  . Influenza,inj,Quad PF,6+ Mos 09/27/2016  . Influenza,inj,quad, With Preservative 06/05/2017  . Influenza-Unspecified 05/14/2019  . Pneumococcal Conjugate-13 02/08/2015  . Pneumococcal Polysaccharide-23 03/05/2018  . Tdap 09/26/2016    Qualifies for Shingles Vaccine? Yes  Zostavax completed n/a. Due for Shingrix. Education has been provided regarding the importance of this vaccine. Pt has been advised to call insurance company to determine out of pocket expense. Advised may also receive vaccine at local pharmacy or  Health Dept. Verbalized acceptance and understanding.  Tdap: up to date   Flu Vaccine: up to date   Pneumococcal Vaccine: up to date   Screening Tests Health Maintenance  Topic Date Due  . TETANUS/TDAP  09/26/2026  . INFLUENZA VACCINE  Completed  . PNA vac Low Risk Adult  Completed   Cancer Screenings:  Colorectal Screening: no longer required   Lung Cancer Screening: (Low Dose CT Chest recommended if Age 74-80 years, 30 pack-year currently smoking OR have quit w/in 15years.) does not qualify.     Additional Screening:  Hepatitis C Screening: does not qualify  Vision Screening: Recommended annual ophthalmology exams for early detection of glaucoma and other disorders of the eye. Is the patient up to date with their annual eye exam?  Yes  Who is the provider or what is the name of the office in which the pt attends annual eye exams? Goes to [facility]   Dental Screening: Recommended annual dental exams for proper oral hygiene  Community Resource Referral:  CRR required this visit?  No        Plan:  I have personally reviewed and addressed the Medicare Annual Wellness questionnaire and have noted the following in the patient's chart:  A. Medical and social history B. Use of alcohol, tobacco or illicit drugs  C. Current medications and supplements D. Functional  ability and status E.  Nutritional status F.  Physical activity G. Advance directives H. List of other physicians I.  Hospitalizations, surgeries, and ER visits in previous 12 months J.  [place] such as hearing and vision if needed, cognitive and depression L. Referrals and appointments   In addition, I have reviewed and discussed with patient certain preventive protocols, quality metrics, and best practice recommendations. A written personalized care plan for preventive services as well as general preventive health recommendations were provided to patient.   Signed,   Bevelyn Ngo, LPN  [id number] Nurse Health Advisor   Nurse Notes: none

## 2019-06-03 NOTE — Patient Instructions (Addendum)
Robert [name] , Thank you for taking time to come for your Medicare Wellness Visit. I appreciate your ongoing commitment to your health goals. Please review the following plan we discussed and let me know if I can assist you in the future.   Screening recommendations/referrals: Colonoscopy: no longer required Recommended yearly ophthalmology/optometry visit for glaucoma screening and checkup Recommended yearly dental visit for hygiene and checkup  Vaccinations: Influenza vaccine: up to date  Pneumococcal vaccine: up to date  Tdap vaccine: up to date Shingles vaccine: shingrix eligible     Advanced directives: please pick up a copy of this information next time you are in the office  Conditions/risks identified:  Please call if you need anything   Next appointment: Follow up in one year for your annual wellness visit.   Preventive Care 19 Years and Older, Male Preventive care refers to lifestyle choices and visits with your health care provider that can promote health and wellness. What does preventive care include?  A yearly physical exam. This is also called an annual well check.  Dental exams once or twice a year.  Routine eye exams. Ask your health care provider how often you should have your eyes checked.  Personal lifestyle choices, including:  Daily care of your teeth and gums.  Regular physical activity.  Eating a healthy diet.  Avoiding tobacco and drug use.  Limiting alcohol use.  Practicing safe sex.  Taking low doses of aspirin every day.  Taking vitamin and mineral supplements as recommended by your health care provider. What happens during an annual well check? The services and screenings done by your health care provider during your annual well check will depend on your age, overall health, lifestyle risk factors, and family history of disease. Counseling  Your health care provider may ask you questions about your:  Alcohol use.  Tobacco use.  Drug  use.  Emotional well-being.  Home and relationship well-being.  Sexual activity.  Eating habits.  History of falls.  Memory and ability to understand (cognition).  Work and work Statistician. Screening  You may have the following tests or measurements:  Height, weight, and BMI.  Blood pressure.  Lipid and cholesterol levels. These may be checked every 5 years, or more frequently if you are over 13 years old.  Skin check.  Lung cancer screening. You may have this screening every year starting at age 41 if you have a 30-pack-year history of smoking and currently smoke or have quit within the past 15 years.  Fecal occult blood test (FOBT) of the stool. You may have this test every year starting at age 5.  Flexible sigmoidoscopy or colonoscopy. You may have a sigmoidoscopy every 5 years or a colonoscopy every 10 years starting at age 23.  Prostate cancer screening. Recommendations will vary depending on your family history and other risks.  Hepatitis C blood test.  Hepatitis B blood test.  Sexually transmitted disease (STD) testing.  Diabetes screening. This is done by checking your blood sugar (glucose) after you have not eaten for a while (fasting). You may have this done every 1-3 years.  Abdominal aortic aneurysm (AAA) screening. You may need this if you are a current or former smoker.  Osteoporosis. You may be screened starting at age 8 if you are at high risk. Talk with your health care provider about your test results, treatment options, and if necessary, the need for more tests. Vaccines  Your health care provider may recommend certain vaccines, such as:  Influenza vaccine. This is recommended every year.  Tetanus, diphtheria, and acellular pertussis (Tdap, Td) vaccine. You may need a Td booster every 10 years.  Zoster vaccine. You may need this after age 92.  Pneumococcal 13-valent conjugate (PCV13) vaccine. One dose is recommended after age 43.   Pneumococcal polysaccharide (PPSV23) vaccine. One dose is recommended after age 24. Talk to your health care provider about which screenings and vaccines you need and how often you need them. This information is not intended to replace advice given to you by your health care provider. Make sure you discuss any questions you have with your health care provider. Document Released: 08/20/2015 Document Revised: 04/12/2016 Document Reviewed: 05/25/2015 Elsevier Interactive Patient Education  2017 [place] Prevention in the Home Falls can cause injuries. They can happen to people of all ages. There are many things you can do to make your home safe and to help prevent falls. What can I do on the outside of my home?  Regularly fix the edges of walkways and driveways and fix any cracks.  Remove anything that might make you trip as you walk through a door, such as a raised step or threshold.  Trim any bushes or trees on the path to your home.  Use bright outdoor lighting.  Clear any walking paths of anything that might make someone trip, such as rocks or tools.  Regularly check to see if handrails are loose or broken. Make sure that both sides of any steps have handrails.  Any raised decks and porches should have guardrails on the edges.  Have any leaves, snow, or ice cleared regularly.  Use sand or salt on walking paths during winter.  Clean up any spills in your garage right away. This includes oil or grease spills. What can I do in the bathroom?  Use night lights.  Install grab bars by the toilet and in the tub and shower. Do not use towel bars as grab bars.  Use non-skid mats or decals in the tub or shower.  If you need to sit down in the shower, use a plastic, non-slip stool.  Keep the floor dry. Clean up any water that spills on the floor as soon as it happens.  Remove soap buildup in the tub or shower regularly.  Attach bath mats securely with double-sided non-slip  rug tape.  Do not have throw rugs and other things on the floor that can make you trip. What can I do in the bedroom?  Use night lights.  Make sure that you have a light by your bed that is easy to reach.  Do not use any sheets or blankets that are too big for your bed. They should not hang down onto the floor.  Have a firm chair that has side arms. You can use this for support while you get dressed.  Do not have throw rugs and other things on the floor that can make you trip. What can I do in the kitchen?  Clean up any spills right away.  Avoid walking on wet floors.  Keep items that you use a lot in easy-to-reach places.  If you need to reach something above you, use a strong step stool that has a grab bar.  Keep electrical cords out of the way.  Do not use floor polish or wax that makes floors slippery. If you must use wax, use non-skid floor wax.  Do not have throw rugs and other things on the floor that  can make you trip. What can I do with my stairs?  Do not leave any items on the stairs.  Make sure that there are handrails on both sides of the stairs and use them. Fix handrails that are broken or loose. Make sure that handrails are as long as the stairways.  Check any carpeting to make sure that it is firmly attached to the stairs. Fix any carpet that is loose or worn.  Avoid having throw rugs at the top or bottom of the stairs. If you do have throw rugs, attach them to the floor with carpet tape.  Make sure that you have a light switch at the top of the stairs and the bottom of the stairs. If you do not have them, ask someone to add them for you. What else can I do to help prevent falls?  Wear shoes that:  Do not have high heels.  Have rubber bottoms.  Are comfortable and fit you well.  Are closed at the toe. Do not wear sandals.  If you use a stepladder:  Make sure that it is fully opened. Do not climb a closed stepladder.  Make sure that both sides of  the stepladder are locked into place.  Ask someone to hold it for you, if possible.  Clearly mark and make sure that you can see:  Any grab bars or handrails.  First and last steps.  Where the edge of each step is.  Use tools that help you move around (mobility aids) if they are needed. These include:  Canes.  Walkers.  Scooters.  Crutches.  Turn on the lights when you go into a dark area. Replace any light bulbs as soon as they burn out.  Set up your furniture so you have a clear path. Avoid moving your furniture around.  If any of your floors are uneven, fix them.  If there are any pets around you, be aware of where they are.  Review your medicines with your doctor. Some medicines can make you feel dizzy. This can increase your chance of falling. Ask your doctor what other things that you can do to help prevent falls. This information is not intended to replace advice given to you by your health care provider. Make sure you discuss any questions you have with your health care provider. Document Released: 05/20/2009 Document Revised: 12/30/2015 Document Reviewed: 08/28/2014 Elsevier Interactive Patient Education  2017 [employer].

## 2019-06-04 ENCOUNTER — Other Ambulatory Visit: Payer: Self-pay

## 2019-06-04 ENCOUNTER — Encounter: Payer: Self-pay | Admitting: Family Medicine

## 2019-06-04 ENCOUNTER — Ambulatory Visit (INDEPENDENT_AMBULATORY_CARE_PROVIDER_SITE_OTHER): Payer: Medicare Other | Admitting: Family Medicine

## 2019-06-04 VITALS — BP 142/70 | HR 59 | Temp 98.4°F | Resp 16 | Ht 67.0 in | Wt 210.0 lb

## 2019-06-04 DIAGNOSIS — I890 Lymphedema, not elsewhere classified: Secondary | ICD-10-CM

## 2019-06-04 DIAGNOSIS — M8949 Other hypertrophic osteoarthropathy, multiple sites: Secondary | ICD-10-CM | POA: Diagnosis not present

## 2019-06-04 DIAGNOSIS — N1831 Chronic kidney disease, stage 3a: Secondary | ICD-10-CM

## 2019-06-04 DIAGNOSIS — G8929 Other chronic pain: Secondary | ICD-10-CM | POA: Diagnosis not present

## 2019-06-04 DIAGNOSIS — M159 Polyosteoarthritis, unspecified: Secondary | ICD-10-CM

## 2019-06-04 DIAGNOSIS — I872 Venous insufficiency (chronic) (peripheral): Secondary | ICD-10-CM

## 2019-06-04 DIAGNOSIS — M25561 Pain in right knee: Secondary | ICD-10-CM | POA: Diagnosis not present

## 2019-06-04 MED ORDER — METHYLPREDNISOLONE ACETATE 40 MG/ML IJ SUSP
40.0000 mg | Freq: Once | INTRAMUSCULAR | Status: AC
  Administered 2019-06-04: 40 mg via INTRA_ARTICULAR

## 2019-06-04 MED ORDER — LIDOCAINE HCL (PF) 1 % IJ SOLN
4.0000 mL | Freq: Once | INTRAMUSCULAR | Status: AC
  Administered 2019-06-04: 4 mL

## 2019-06-04 MED ORDER — FUROSEMIDE 20 MG PO TABS: 20.0000 mg | ORAL_TABLET | Freq: Every day | ORAL | 1 refills | Status: DC | PRN

## 2019-06-04 NOTE — Progress Notes (Signed)
Subjective:    Patient ID: Robert [name], male    DOB: Jun 07, 1932, 85 y.o.   MRN: [id number]  Robert [name] is a 85 y.o. male presenting on 06/04/2019 for Knee Pain (Right side)   HPI   Follow-up Knee Pain /Bilateral Gout Arthritis - Last visit with 05/2019,he had previous 06/2018 x-rays knees that showed significant moderate to severe R>L DJD see result and he was started on meloxicam 7.5mg  daily PRN pain and offered injections,see prior notes for background information. - Interval update withseems persistentknee pain without improvement or worsening - Today patientis interested in knee injection still problem with R knee primarily due to pain and arthritis, now some swelling - No prior knee injection in past. - Taking Allopurinol, no new gout flares, last uric acid 5.5 - Taking Meloxicam PRN - He has chronic known knee issues >10 years He takes Allopurinol 300mg  - TakingMeloxicam 7.5mg  PRN,Tylenol - occasionally Denies injury recent fall or trauma, redness, swelling, numb tingling   Depression screen [facility] 2/9 06/04/2019 06/03/2019 11/08/2018  Decreased Interest 0 0 0  Down, Depressed, Hopeless 0 0 0  PHQ - 2 Score 0 0 0    Social History   Tobacco Use  . Smoking status: Former Smoker    Quit date: 1985    Years since quitting: 35.8  . Smokeless tobacco: Never Used  Substance Use Topics  . Alcohol use: Never    Frequency: Never  . Drug use: Never    Review of Systems Per HPI unless specifically indicated above     Objective:    BP (!) 142/70   Pulse (!) 59   Temp 98.4 F (36.9 C) (Oral)   Resp 16   Ht 5\' 7"  (1.702 m)   Wt 210 lb (95.3 kg)   BMI 32.89 kg/m   Wt Readings from Last 3 Encounters:  06/04/19 210 lb (95.3 kg)  05/26/19 211 lb 12.8 oz (96.1 kg)  07/03/18 222 lb 6.4 oz (100.9 kg)    Physical Exam Vitals signs and nursing note reviewed.  Constitutional:      General: He is not in acute distress.    Appearance: He is well-developed. He is  not diaphoretic.     Comments: Well-appearing, comfortable, cooperative  HENT:     Head: Normocephalic and atraumatic.  Eyes:     General:        Right eye: No discharge.        Left eye: No discharge.     Conjunctiva/sclera: Conjunctivae normal.  Neck:     Musculoskeletal: Normal range of motion and neck supple.     Thyroid: No thyromegaly.  Cardiovascular:     Rate and Rhythm: Normal rate and regular rhythm.     Heart sounds: Normal heart sounds. No murmur.  Pulmonary:     Effort: Pulmonary effort is normal. No respiratory distress.     Breath sounds: Normal breath sounds. No wheezing or rales.  Musculoskeletal: Normal range of motion.     Right lower leg: Edema (+1 pitting edema lower extremity, some changes of chronic venous stasis) present.     Left lower leg: Edema (trace) present.     Comments: Right knee, with some mild effusion compared to Left Good range of motion but notable crepitus on exam Mild tender medial joint line Similar to prior exam  Lymphadenopathy:     Cervical: No cervical adenopathy.  Skin:    General: Skin is warm and dry.     Findings: No erythema  or rash.     Comments: lower extremity dry skin and chronic stasis changes  Neurological:     Mental Status: He is alert and oriented to person, place, and time.  Psychiatric:        Behavior: Behavior normal.     Comments: Well groomed, good eye contact, normal speech and thoughts    ________________________________________________________ PROCEDURE NOTE Date: 06/04/19 Right Knee corticosteroid injection Discussed benefits and risks (including pain, bleeding, infection, steroid flare). Verbal consent given by patient. Medication:  1 cc Depo-medrol 40mg  and 4 cc Lidocaine 1% without epi Time Out taken  Landmarks identified. Area cleansed with alcohol wipes. Using 21 gauge and 1, 1/2 inch needle, Right knee joint space was injected (with above listed medication) via medial approach cold spray used for  superficial anesthetic. Sterile bandage placed. Patient tolerated procedure well without bleeding or paresthesias. No complications.  Results for orders placed or performed in visit on 05/26/19  Hemoglobin A1c  Result Value Ref Range   Hgb A1c MFr Bld 5.6 <5.7 % of total Hgb   Mean Plasma Glucose 114 (calc)   eAG (mmol/L) 6.3 (calc)  COMPLETE METABOLIC PANEL WITH GFR  Result Value Ref Range   Glucose, Bld 103 65 - 139 mg/dL   BUN 17 7 - 25 mg/dL   Creat 1.27 (H) 0.70 - 1.11 mg/dL   GFR, Est Non African American 51 (L) > OR = 60 mL/min/1.58m2   GFR, Est African American 59 (L) > OR = 60 mL/min/1.32m2   BUN/Creatinine Ratio 13 6 - 22 (calc)   Sodium 143 135 - 146 mmol/L   Potassium 4.1 3.5 - 5.3 mmol/L   Chloride 108 98 - 110 mmol/L   CO2 24 20 - 32 mmol/L   Calcium 9.4 8.6 - 10.3 mg/dL   Total Protein 7.1 6.1 - 8.1 g/dL   Albumin 4.5 3.6 - 5.1 g/dL   Globulin 2.6 1.9 - 3.7 g/dL (calc)   AG Ratio 1.7 1.0 - 2.5 (calc)   Total Bilirubin 1.6 (H) 0.2 - 1.2 mg/dL   Alkaline phosphatase (APISO) 86 35 - 144 U/L   AST 22 10 - 35 U/L   ALT 13 9 - 46 U/L  CBC with Differential/Platelet  Result Value Ref Range   WBC 16.9 (H) 3.8 - 10.8 Thousand/uL   RBC 5.35 4.20 - 5.80 Million/uL   Hemoglobin 12.7 (L) 13.2 - 17.1 g/dL   HCT 40.2 38.5 - 50.0 %   MCV 75.1 (L) 80.0 - 100.0 fL   MCH 23.7 (L) 27.0 - 33.0 pg   MCHC 31.6 (L) 32.0 - 36.0 g/dL   RDW 18.5 (H) 11.0 - 15.0 %   Platelets 247 140 - 400 Thousand/uL   MPV 10.3 7.5 - 12.5 fL   Neutro Abs 5,256 1,500 - 7,800 cells/uL   Lymphs Abs 9,599 (H) 850 - 3,900 cells/uL   Absolute Monocytes 1,572 (H) 200 - 950 cells/uL   Eosinophils Absolute 355 15 - 500 cells/uL   Basophils Absolute 118 0 - 200 cells/uL   Neutrophils Relative % 31.1 %   Total Lymphocyte 56.8 %   Monocytes Relative 9.3 %   Eosinophils Relative 2.1 %   Basophils Relative 0.7 %   Smear Review    Uric acid  Result Value Ref Range   Uric Acid, Serum 5.5 4.0 - 8.0 mg/dL       Assessment & Plan:   Problem List Items Addressed This Visit    Osteoarthritis of multiple joints  RESOLVED: Morbid obesity ([place])   Lymphedema of lower extremity   Relevant Medications   furosemide (LASIX) 20 MG tablet   Other Relevant Orders   Ambulatory referral to Vascular Surgery   CKD (chronic kidney disease), stage III   Chronic venous insufficiency   Relevant Medications   furosemide (LASIX) 20 MG tablet   Other Relevant Orders   Ambulatory referral to Vascular Surgery    Other Visit Diagnoses    Chronic pain of right knee    -  Primary   Relevant Medications   lidocaine (PF) (XYLOCAINE) 1 % injection 4 mL (Completed)   methylPREDNISolone acetate (DEPO-MEDROL) injection 40 mg (Completed)      Persistent chronic R>L generalized Knee pain, with known advanced osteoarthritis DJD  R knee steroid injection today, tolerated well. See procedure note Use Lasix PRN, elevation, compression Meloxicam PRN, caution elevated Cr CKD   # Edema / venous insufficiency Trial on Lasix with caution, intermittent dose. - caution with CKD Compression Elevation RICE therapy Referral to [facility] vascular   Meds ordered this encounter  Medications  . lidocaine (PF) (XYLOCAINE) 1 % injection 4 mL  . methylPREDNISolone acetate (DEPO-MEDROL) injection 40 mg  . furosemide (LASIX) 20 MG tablet    Sig: Take 1 tablet (20 mg total) by mouth daily as needed for fluid or edema. For up to 3-5 days if worse swelling.    Dispense:  30 tablet    Refill:  1      Follow up plan: Return in about 3 months (around 09/04/2019) for knee pain.   Nobie Putnam, [facility] 06/04/2019, 2:41 PM

## 2019-06-04 NOTE — Patient Instructions (Addendum)
Thank you for coming to the office today.  Referral to Vascular specialist for circulation swelling  [facility] [address], [place] [address]  Main: [phone]   Take furosemide lasix fluid pill ONLY AS NEEDED one a day for swelling, up to 3-5 days if need, caution for kidneys.  Limit meloxicam to avoid kidney function reduction.   You received a Right Knee Joint steroid injection today. - Lidocaine numbing medicine may ease the pain initially for a few hours until it wears off - As discussed, you may experience a "steroid flare" this evening or within 24-48 hours, anytime medicine is injected into an inflamed joint it can cause the pain to get worse temporarily - Everyone responds differently to these injections, it depends on the patient and the severity of the joint problem, it may provide anywhere from days to weeks, to months of relief. Ideal response is >6 months relief - Try to take it easy for next 1-2 days, avoid over activity and strain on joint (limit walking for knee) - Recommend the following:   - For swelling - rest, compression sleeve / ACE wrap, elevation, and ice packs as needed for first few days   - For pain in future may use heating pad or moist heat as needed   Please schedule a Follow-up Appointment to: Return in about 3 months (around 09/04/2019) for knee pain.  If you have any other questions or concerns, please feel free to call the office or send a message through [place]. You may also schedule an earlier appointment if necessary.  Additionally, you may be receiving a survey about your experience at our office within a few days to 1 week by e-mail or mail. We value your feedback.  Nobie Putnam, DO [place]

## 2019-06-13 ENCOUNTER — Encounter (INDEPENDENT_AMBULATORY_CARE_PROVIDER_SITE_OTHER): Payer: Self-pay | Admitting: Vascular Surgery

## 2019-06-13 ENCOUNTER — Other Ambulatory Visit: Payer: Self-pay

## 2019-06-13 ENCOUNTER — Ambulatory Visit (INDEPENDENT_AMBULATORY_CARE_PROVIDER_SITE_OTHER): Payer: Medicare Other | Admitting: Vascular Surgery

## 2019-06-13 VITALS — BP 128/74 | HR 50 | Resp 16 | Wt 207.6 lb

## 2019-06-13 DIAGNOSIS — N183 Chronic kidney disease, stage 3 unspecified: Secondary | ICD-10-CM | POA: Diagnosis not present

## 2019-06-13 DIAGNOSIS — N1831 Chronic kidney disease, stage 3a: Secondary | ICD-10-CM

## 2019-06-13 DIAGNOSIS — I890 Lymphedema, not elsewhere classified: Secondary | ICD-10-CM

## 2019-06-13 DIAGNOSIS — R7303 Prediabetes: Secondary | ICD-10-CM

## 2019-06-13 DIAGNOSIS — I129 Hypertensive chronic kidney disease with stage 1 through stage 4 chronic kidney disease, or unspecified chronic kidney disease: Secondary | ICD-10-CM

## 2019-06-13 NOTE — Assessment & Plan Note (Signed)

## 2019-06-13 NOTE — Assessment & Plan Note (Signed)
blood glucose control important in reducing the progression of atherosclerotic disease. Also, involved in wound healing.  Not on medications currently

## 2019-06-13 NOTE — Assessment & Plan Note (Signed)
Renal issues can certainly worsen lower extremity swelling
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
## 2019-06-13 NOTE — Patient Instructions (Signed)

## 2019-06-13 NOTE — Assessment & Plan Note (Signed)
blood pressure control important in reducing the progression of atherosclerotic disease. On appropriate oral medications.  

## 2019-06-17 ENCOUNTER — Other Ambulatory Visit (INDEPENDENT_AMBULATORY_CARE_PROVIDER_SITE_OTHER): Payer: Self-pay | Admitting: Vascular Surgery

## 2019-06-17 DIAGNOSIS — M7989 Other specified soft tissue disorders: Secondary | ICD-10-CM

## 2019-06-17 DIAGNOSIS — M79606 Pain in leg, unspecified: Secondary | ICD-10-CM

## 2019-06-18 ENCOUNTER — Ambulatory Visit (INDEPENDENT_AMBULATORY_CARE_PROVIDER_SITE_OTHER): Payer: Medicare Other | Admitting: Nurse Practitioner

## 2019-06-18 ENCOUNTER — Other Ambulatory Visit: Payer: Self-pay

## 2019-06-18 ENCOUNTER — Encounter (INDEPENDENT_AMBULATORY_CARE_PROVIDER_SITE_OTHER): Payer: Self-pay | Admitting: Nurse Practitioner

## 2019-06-18 ENCOUNTER — Ambulatory Visit (INDEPENDENT_AMBULATORY_CARE_PROVIDER_SITE_OTHER): Payer: Medicare Other

## 2019-06-18 VITALS — BP 145/72 | HR 74 | Resp 16 | Wt 204.6 lb

## 2019-06-18 DIAGNOSIS — M79606 Pain in leg, unspecified: Secondary | ICD-10-CM | POA: Diagnosis not present

## 2019-06-18 DIAGNOSIS — E7800 Pure hypercholesterolemia, unspecified: Secondary | ICD-10-CM

## 2019-06-18 DIAGNOSIS — I872 Venous insufficiency (chronic) (peripheral): Secondary | ICD-10-CM

## 2019-06-18 DIAGNOSIS — M7989 Other specified soft tissue disorders: Secondary | ICD-10-CM

## 2019-06-18 DIAGNOSIS — I890 Lymphedema, not elsewhere classified: Secondary | ICD-10-CM
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
## 2019-08-12 DIAGNOSIS — N189 Chronic kidney disease, unspecified: Secondary | ICD-10-CM | POA: Diagnosis not present

## 2019-08-12 DIAGNOSIS — D72829 Elevated white blood cell count, unspecified: Secondary | ICD-10-CM | POA: Diagnosis not present

## 2019-08-12 DIAGNOSIS — Z791 Long term (current) use of non-steroidal anti-inflammatories (NSAID): Secondary | ICD-10-CM | POA: Diagnosis not present

## 2019-08-12 DIAGNOSIS — Z87891 Personal history of nicotine dependence: Secondary | ICD-10-CM | POA: Diagnosis not present

## 2019-08-12 DIAGNOSIS — Z79899 Other long term (current) drug therapy: Secondary | ICD-10-CM | POA: Diagnosis not present

## 2019-08-12 DIAGNOSIS — L03115 Cellulitis of right lower limb: Secondary | ICD-10-CM | POA: Diagnosis not present

## 2019-08-12 DIAGNOSIS — R600 Localized edema: Secondary | ICD-10-CM | POA: Diagnosis not present

## 2019-08-12 DIAGNOSIS — E785 Hyperlipidemia, unspecified: Secondary | ICD-10-CM | POA: Diagnosis not present

## 2019-08-12 DIAGNOSIS — M109 Gout, unspecified: Secondary | ICD-10-CM | POA: Diagnosis not present

## 2019-08-12 DIAGNOSIS — I129 Hypertensive chronic kidney disease with stage 1 through stage 4 chronic kidney disease, or unspecified chronic kidney disease: Secondary | ICD-10-CM | POA: Diagnosis not present

## 2019-08-13 ENCOUNTER — Other Ambulatory Visit: Payer: Self-pay | Admitting: Family Medicine

## 2019-08-13 DIAGNOSIS — I129 Hypertensive chronic kidney disease with stage 1 through stage 4 chronic kidney disease, or unspecified chronic kidney disease: Secondary | ICD-10-CM

## 2019-08-13 DIAGNOSIS — N183 Chronic kidney disease, stage 3 unspecified: Secondary | ICD-10-CM

## 2019-08-13 DIAGNOSIS — M1A9XX0 Chronic gout, unspecified, without tophus (tophi): Secondary | ICD-10-CM

## 2019-08-30 DIAGNOSIS — J9601 Acute respiratory failure with hypoxia: Secondary | ICD-10-CM | POA: Diagnosis not present

## 2019-08-30 DIAGNOSIS — N189 Chronic kidney disease, unspecified: Secondary | ICD-10-CM | POA: Diagnosis not present

## 2019-08-30 DIAGNOSIS — Z9981 Dependence on supplemental oxygen: Secondary | ICD-10-CM | POA: Diagnosis not present

## 2019-08-30 DIAGNOSIS — I340 Nonrheumatic mitral (valve) insufficiency: Secondary | ICD-10-CM | POA: Diagnosis not present

## 2019-08-30 DIAGNOSIS — J9691 Respiratory failure, unspecified with hypoxia: Secondary | ICD-10-CM | POA: Diagnosis not present

## 2019-08-30 DIAGNOSIS — J90 Pleural effusion, not elsewhere classified: Secondary | ICD-10-CM | POA: Diagnosis not present

## 2019-08-30 DIAGNOSIS — M109 Gout, unspecified: Secondary | ICD-10-CM | POA: Diagnosis not present

## 2019-08-30 DIAGNOSIS — N183 Chronic kidney disease, stage 3 unspecified: Secondary | ICD-10-CM | POA: Diagnosis not present

## 2019-08-30 DIAGNOSIS — E785 Hyperlipidemia, unspecified: Secondary | ICD-10-CM | POA: Diagnosis not present

## 2019-08-30 DIAGNOSIS — U071 COVID-19: Secondary | ICD-10-CM | POA: Diagnosis not present

## 2019-08-30 DIAGNOSIS — D6859 Other primary thrombophilia: Secondary | ICD-10-CM | POA: Diagnosis not present

## 2019-08-30 DIAGNOSIS — A4189 Other specified sepsis: Secondary | ICD-10-CM | POA: Diagnosis not present

## 2019-08-30 DIAGNOSIS — R739 Hyperglycemia, unspecified: Secondary | ICD-10-CM | POA: Diagnosis not present

## 2019-08-30 DIAGNOSIS — D72829 Elevated white blood cell count, unspecified: Secondary | ICD-10-CM | POA: Diagnosis not present

## 2019-08-30 DIAGNOSIS — I5031 Acute diastolic (congestive) heart failure: Secondary | ICD-10-CM | POA: Diagnosis not present

## 2019-08-30 DIAGNOSIS — R093 Abnormal sputum: Secondary | ICD-10-CM | POA: Diagnosis not present

## 2019-08-30 DIAGNOSIS — R531 Weakness: Secondary | ICD-10-CM | POA: Diagnosis not present

## 2019-08-30 DIAGNOSIS — T380X5A Adverse effect of glucocorticoids and synthetic analogues, initial encounter: Secondary | ICD-10-CM | POA: Diagnosis not present

## 2019-08-30 DIAGNOSIS — I129 Hypertensive chronic kidney disease with stage 1 through stage 4 chronic kidney disease, or unspecified chronic kidney disease: Secondary | ICD-10-CM | POA: Diagnosis not present

## 2019-08-30 DIAGNOSIS — I272 Pulmonary hypertension, unspecified: Secondary | ICD-10-CM | POA: Diagnosis not present

## 2019-08-30 DIAGNOSIS — J9600 Acute respiratory failure, unspecified whether with hypoxia or hypercapnia: Secondary | ICD-10-CM | POA: Diagnosis not present

## 2019-08-30 DIAGNOSIS — Z66 Do not resuscitate: Secondary | ICD-10-CM | POA: Diagnosis not present

## 2019-08-30 DIAGNOSIS — D6869 Other thrombophilia: Secondary | ICD-10-CM | POA: Diagnosis not present

## 2019-08-30 DIAGNOSIS — R05 Cough: Secondary | ICD-10-CM | POA: Diagnosis not present

## 2019-08-30 DIAGNOSIS — I5032 Chronic diastolic (congestive) heart failure: Secondary | ICD-10-CM | POA: Diagnosis not present

## 2019-08-30 DIAGNOSIS — R0602 Shortness of breath: Secondary | ICD-10-CM | POA: Diagnosis not present

## 2019-08-30 DIAGNOSIS — I083 Combined rheumatic disorders of mitral, aortic and tricuspid valves: Secondary | ICD-10-CM | POA: Diagnosis not present

## 2019-08-30 DIAGNOSIS — R918 Other nonspecific abnormal finding of lung field: Secondary | ICD-10-CM | POA: Diagnosis not present

## 2019-08-30 DIAGNOSIS — I459 Conduction disorder, unspecified: Secondary | ICD-10-CM | POA: Diagnosis not present

## 2019-08-30 DIAGNOSIS — I10 Essential (primary) hypertension: Secondary | ICD-10-CM | POA: Diagnosis not present

## 2019-08-30 DIAGNOSIS — R6520 Severe sepsis without septic shock: Secondary | ICD-10-CM | POA: Diagnosis not present

## 2019-08-30 DIAGNOSIS — I13 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 failure and stage 1 through stage 4 chronic kidney disease, or unspecified chronic kidney disease: Secondary | ICD-10-CM | POA: Diagnosis not present

## 2019-08-30 DIAGNOSIS — Z87891 Personal history of nicotine dependence: Secondary | ICD-10-CM | POA: Diagnosis not present

## 2019-08-30 DIAGNOSIS — I5030 Unspecified diastolic (congestive) heart failure: Secondary | ICD-10-CM | POA: Diagnosis not present

## 2019-08-30 DIAGNOSIS — J9811 Atelectasis: Secondary | ICD-10-CM | POA: Diagnosis not present

## 2019-08-30 DIAGNOSIS — R0902 Hypoxemia: Secondary | ICD-10-CM | POA: Diagnosis not present

## 2019-08-30 DIAGNOSIS — E875 Hyperkalemia: Secondary | ICD-10-CM | POA: Diagnosis not present

## 2019-08-30 DIAGNOSIS — R600 Localized edema: Secondary | ICD-10-CM | POA: Diagnosis not present

## 2019-08-30 DIAGNOSIS — J1282 Pneumonia due to coronavirus disease 2019: Secondary | ICD-10-CM | POA: Diagnosis not present

## 2019-08-30 DIAGNOSIS — I4891 Unspecified atrial fibrillation: Secondary | ICD-10-CM | POA: Diagnosis not present

## 2019-08-30 DIAGNOSIS — I509 Heart failure, unspecified: Secondary | ICD-10-CM | POA: Diagnosis not present

## 2019-08-31 DIAGNOSIS — R600 Localized edema: Secondary | ICD-10-CM | POA: Diagnosis not present

## 2019-08-31 DIAGNOSIS — I459 Conduction disorder, unspecified: Secondary | ICD-10-CM | POA: Diagnosis not present

## 2019-08-31 DIAGNOSIS — R918 Other nonspecific abnormal finding of lung field: Secondary | ICD-10-CM | POA: Diagnosis not present

## 2019-08-31 DIAGNOSIS — R0902 Hypoxemia: Secondary | ICD-10-CM | POA: Diagnosis not present

## 2019-08-31 DIAGNOSIS — I4891 Unspecified atrial fibrillation: Secondary | ICD-10-CM | POA: Diagnosis not present

## 2019-08-31 DIAGNOSIS — J90 Pleural effusion, not elsewhere classified: Secondary | ICD-10-CM | POA: Diagnosis not present

## 2019-08-31 DIAGNOSIS — U071 COVID-19: Secondary | ICD-10-CM | POA: Diagnosis not present

## 2019-09-01 DIAGNOSIS — I272 Pulmonary hypertension, unspecified: Secondary | ICD-10-CM | POA: Diagnosis not present

## 2019-09-01 DIAGNOSIS — I083 Combined rheumatic disorders of mitral, aortic and tricuspid valves: Secondary | ICD-10-CM | POA: Diagnosis not present

## 2019-09-03 ENCOUNTER — Ambulatory Visit: Payer: Medicare Other | Admitting: Family Medicine

## 2019-09-05 MED ORDER — GUAIFENESIN 100 MG/5ML PO SYRP
200.00 | ORAL_SOLUTION | ORAL | Status: DC
Start: ? — End: 2019-09-05

## 2019-09-05 MED ORDER — ATENOLOL 25 MG PO TABS
50.00 | ORAL_TABLET | ORAL | Status: DC
Start: 2019-09-06 — End: 2019-09-05

## 2019-09-05 MED ORDER — INSULIN LISPRO 100 UNIT/ML ~~LOC~~ SOLN
0.00 | SUBCUTANEOUS | Status: DC
Start: 2019-09-05 — End: 2019-09-05

## 2019-09-05 MED ORDER — ALLOPURINOL 300 MG PO TABS
300.00 | ORAL_TABLET | ORAL | Status: DC
Start: 2019-09-06 — End: 2019-09-05

## 2019-09-05 MED ORDER — GENERIC EXTERNAL MEDICATION
Status: DC
Start: ? — End: 2019-09-05

## 2019-09-05 MED ORDER — DEXTROSE 50 % IV SOLN
12.50 | INTRAVENOUS | Status: DC
Start: ? — End: 2019-09-05

## 2019-09-05 MED ORDER — FAMOTIDINE 20 MG PO TABS
20.00 | ORAL_TABLET | ORAL | Status: DC
Start: 2019-09-05 — End: 2019-09-05

## 2019-09-05 MED ORDER — APIXABAN 5 MG PO TABS
5.00 | ORAL_TABLET | ORAL | Status: DC
Start: 2019-09-05 — End: 2019-09-05

## 2019-09-05 MED ORDER — ACETAMINOPHEN 325 MG PO TABS
650.00 | ORAL_TABLET | ORAL | Status: DC
Start: ? — End: 2019-09-05

## 2019-09-05 MED ORDER — POLYETHYLENE GLYCOL 3350 17 GM/SCOOP PO POWD
17.00 | ORAL | Status: DC
Start: 2019-09-06 — End: 2019-09-05

## 2019-09-05 MED ORDER — MAGNESIUM OXIDE 400 MG PO TABS
400.00 | ORAL_TABLET | ORAL | Status: DC
Start: 2019-09-06 — End: 2019-09-05

## 2019-09-05 MED ORDER — DEXAMETHASONE 4 MG PO TABS
6.00 | ORAL_TABLET | ORAL | Status: DC
Start: 2019-09-06 — End: 2019-09-05

## 2019-09-05 MED ORDER — CALCIUM CARBONATE ANTACID 500 MG PO CHEW
CHEWABLE_TABLET | ORAL | Status: DC
Start: ? — End: 2019-09-05

## 2019-09-05 MED ORDER — SENNOSIDES 8.6 MG PO TABS
2.00 | ORAL_TABLET | ORAL | Status: DC
Start: ? — End: 2019-09-05

## 2019-09-05 MED ORDER — AMLODIPINE BESYLATE 10 MG PO TABS
10.00 | ORAL_TABLET | ORAL | Status: DC
Start: 2019-09-06 — End: 2019-09-05

## 2019-09-05 MED ORDER — ALBUTEROL SULFATE HFA 108 (90 BASE) MCG/ACT IN AERS
2.00 | INHALATION_SPRAY | RESPIRATORY_TRACT | Status: DC
Start: ? — End: 2019-09-05

## 2019-09-05 MED ORDER — MELATONIN 3 MG PO TABS
3.00 | ORAL_TABLET | ORAL | Status: DC
Start: 2019-09-05 — End: 2019-09-05

## 2019-09-10 DIAGNOSIS — I13 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 failure and stage 1 through stage 4 chronic kidney disease, or unspecified chronic kidney disease: Secondary | ICD-10-CM | POA: Diagnosis not present

## 2019-09-10 DIAGNOSIS — I5032 Chronic diastolic (congestive) heart failure: Secondary | ICD-10-CM | POA: Diagnosis not present

## 2019-09-10 DIAGNOSIS — M109 Gout, unspecified: Secondary | ICD-10-CM | POA: Diagnosis not present

## 2019-09-10 DIAGNOSIS — M6281 Muscle weakness (generalized): Secondary | ICD-10-CM | POA: Diagnosis not present

## 2019-09-10 DIAGNOSIS — I081 Rheumatic disorders of both mitral and tricuspid valves: Secondary | ICD-10-CM | POA: Diagnosis not present

## 2019-09-10 DIAGNOSIS — I4891 Unspecified atrial fibrillation: Secondary | ICD-10-CM | POA: Diagnosis not present

## 2019-09-10 DIAGNOSIS — N183 Chronic kidney disease, stage 3 unspecified: Secondary | ICD-10-CM | POA: Diagnosis not present

## 2019-09-10 DIAGNOSIS — I272 Pulmonary hypertension, unspecified: Secondary | ICD-10-CM | POA: Diagnosis not present

## 2019-09-11 DIAGNOSIS — I13 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 failure and stage 1 through stage 4 chronic kidney disease, or unspecified chronic kidney disease: Secondary | ICD-10-CM | POA: Diagnosis not present

## 2019-09-11 DIAGNOSIS — I5032 Chronic diastolic (congestive) heart failure: Secondary | ICD-10-CM | POA: Diagnosis not present

## 2019-09-11 DIAGNOSIS — R197 Diarrhea, unspecified: Secondary | ICD-10-CM | POA: Diagnosis not present

## 2019-09-11 DIAGNOSIS — R498 Other voice and resonance disorders: Secondary | ICD-10-CM | POA: Diagnosis not present

## 2019-09-11 DIAGNOSIS — Z7901 Long term (current) use of anticoagulants: Secondary | ICD-10-CM | POA: Diagnosis not present

## 2019-09-11 DIAGNOSIS — R509 Fever, unspecified: Secondary | ICD-10-CM | POA: Diagnosis not present

## 2019-09-11 DIAGNOSIS — Z9989 Dependence on other enabling machines and devices: Secondary | ICD-10-CM | POA: Diagnosis not present

## 2019-09-11 DIAGNOSIS — C8590 Non-Hodgkin lymphoma, unspecified, unspecified site: Secondary | ICD-10-CM | POA: Diagnosis not present

## 2019-09-11 DIAGNOSIS — I447 Left bundle-branch block, unspecified: Secondary | ICD-10-CM | POA: Diagnosis not present

## 2019-09-11 DIAGNOSIS — N189 Chronic kidney disease, unspecified: Secondary | ICD-10-CM | POA: Diagnosis not present

## 2019-09-11 DIAGNOSIS — D6859 Other primary thrombophilia: Secondary | ICD-10-CM | POA: Diagnosis not present

## 2019-09-11 DIAGNOSIS — I459 Conduction disorder, unspecified: Secondary | ICD-10-CM | POA: Diagnosis not present

## 2019-09-11 DIAGNOSIS — M6250 Muscle wasting and atrophy, not elsewhere classified, unspecified site: Secondary | ICD-10-CM | POA: Diagnosis not present

## 2019-09-11 DIAGNOSIS — R042 Hemoptysis: Secondary | ICD-10-CM | POA: Diagnosis not present

## 2019-09-11 DIAGNOSIS — R52 Pain, unspecified: Secondary | ICD-10-CM | POA: Diagnosis not present

## 2019-09-11 DIAGNOSIS — I5030 Unspecified diastolic (congestive) heart failure: Secondary | ICD-10-CM | POA: Diagnosis not present

## 2019-09-11 DIAGNOSIS — R6520 Severe sepsis without septic shock: Secondary | ICD-10-CM | POA: Diagnosis not present

## 2019-09-11 DIAGNOSIS — R918 Other nonspecific abnormal finding of lung field: Secondary | ICD-10-CM | POA: Diagnosis not present

## 2019-09-11 DIAGNOSIS — L97911 Non-pressure chronic ulcer of unspecified part of right lower leg limited to breakdown of skin: Secondary | ICD-10-CM | POA: Diagnosis not present

## 2019-09-11 DIAGNOSIS — E569 Vitamin deficiency, unspecified: Secondary | ICD-10-CM | POA: Diagnosis not present

## 2019-09-11 DIAGNOSIS — R0600 Dyspnea, unspecified: Secondary | ICD-10-CM | POA: Diagnosis not present

## 2019-09-11 DIAGNOSIS — R05 Cough: Secondary | ICD-10-CM | POA: Diagnosis not present

## 2019-09-11 DIAGNOSIS — M109 Gout, unspecified: Secondary | ICD-10-CM | POA: Diagnosis not present

## 2019-09-11 DIAGNOSIS — J80 Acute respiratory distress syndrome: Secondary | ICD-10-CM | POA: Diagnosis not present

## 2019-09-11 DIAGNOSIS — Z66 Do not resuscitate: Secondary | ICD-10-CM | POA: Diagnosis not present

## 2019-09-11 DIAGNOSIS — M6281 Muscle weakness (generalized): Secondary | ICD-10-CM | POA: Diagnosis not present

## 2019-09-11 DIAGNOSIS — A4189 Other specified sepsis: Secondary | ICD-10-CM | POA: Diagnosis not present

## 2019-09-11 DIAGNOSIS — Z9981 Dependence on supplemental oxygen: Secondary | ICD-10-CM | POA: Diagnosis not present

## 2019-09-11 DIAGNOSIS — Z87891 Personal history of nicotine dependence: Secondary | ICD-10-CM | POA: Diagnosis not present

## 2019-09-11 DIAGNOSIS — J9691 Respiratory failure, unspecified with hypoxia: Secondary | ICD-10-CM | POA: Diagnosis not present

## 2019-09-11 DIAGNOSIS — D72820 Lymphocytosis (symptomatic): Secondary | ICD-10-CM | POA: Diagnosis not present

## 2019-09-11 DIAGNOSIS — E43 Unspecified severe protein-calorie malnutrition: Secondary | ICD-10-CM | POA: Diagnosis not present

## 2019-09-11 DIAGNOSIS — N179 Acute kidney failure, unspecified: Secondary | ICD-10-CM | POA: Diagnosis not present

## 2019-09-11 DIAGNOSIS — Z79899 Other long term (current) drug therapy: Secondary | ICD-10-CM | POA: Diagnosis not present

## 2019-09-11 DIAGNOSIS — J1282 Pneumonia due to coronavirus disease 2019: Secondary | ICD-10-CM | POA: Diagnosis not present

## 2019-09-11 DIAGNOSIS — D72829 Elevated white blood cell count, unspecified: Secondary | ICD-10-CM | POA: Diagnosis not present

## 2019-09-11 DIAGNOSIS — E785 Hyperlipidemia, unspecified: Secondary | ICD-10-CM | POA: Diagnosis not present

## 2019-09-11 DIAGNOSIS — R0602 Shortness of breath: Secondary | ICD-10-CM | POA: Diagnosis not present

## 2019-09-11 DIAGNOSIS — R488 Other symbolic dysfunctions: Secondary | ICD-10-CM | POA: Diagnosis not present

## 2019-09-11 DIAGNOSIS — C9110 Chronic lymphocytic leukemia of B-cell type not having achieved remission: Secondary | ICD-10-CM | POA: Diagnosis not present

## 2019-09-11 DIAGNOSIS — I10 Essential (primary) hypertension: Secondary | ICD-10-CM | POA: Diagnosis not present

## 2019-09-11 DIAGNOSIS — U071 COVID-19: Secondary | ICD-10-CM | POA: Diagnosis not present

## 2019-09-11 DIAGNOSIS — D696 Thrombocytopenia, unspecified: Secondary | ICD-10-CM | POA: Diagnosis not present

## 2019-09-11 DIAGNOSIS — J159 Unspecified bacterial pneumonia: Secondary | ICD-10-CM | POA: Diagnosis not present

## 2019-09-11 DIAGNOSIS — J9600 Acute respiratory failure, unspecified whether with hypoxia or hypercapnia: Secondary | ICD-10-CM | POA: Diagnosis not present

## 2019-09-11 DIAGNOSIS — Z8616 Personal history of COVID-19: Secondary | ICD-10-CM | POA: Diagnosis not present

## 2019-09-11 DIAGNOSIS — R0902 Hypoxemia: Secondary | ICD-10-CM | POA: Diagnosis not present

## 2019-09-11 DIAGNOSIS — L97921 Non-pressure chronic ulcer of unspecified part of left lower leg limited to breakdown of skin: Secondary | ICD-10-CM | POA: Diagnosis not present

## 2019-09-11 DIAGNOSIS — I4891 Unspecified atrial fibrillation: Secondary | ICD-10-CM | POA: Diagnosis not present

## 2019-09-11 DIAGNOSIS — I872 Venous insufficiency (chronic) (peripheral): Secondary | ICD-10-CM | POA: Diagnosis not present

## 2019-09-11 DIAGNOSIS — J9601 Acute respiratory failure with hypoxia: Secondary | ICD-10-CM | POA: Diagnosis not present

## 2019-09-12 ENCOUNTER — Ambulatory Visit: Payer: Medicare Other | Admitting: Family Medicine

## 2019-09-12 ENCOUNTER — Other Ambulatory Visit: Payer: Self-pay

## 2019-09-17 DIAGNOSIS — C9110 Chronic lymphocytic leukemia of B-cell type not having achieved remission: Secondary | ICD-10-CM | POA: Insufficient documentation

## 2019-09-17 DIAGNOSIS — I4891 Unspecified atrial fibrillation: Secondary | ICD-10-CM | POA: Insufficient documentation

## 2019-09-22 ENCOUNTER — Ambulatory Visit (INDEPENDENT_AMBULATORY_CARE_PROVIDER_SITE_OTHER): Payer: Medicare Other | Admitting: Nurse Practitioner

## 2019-10-01 DIAGNOSIS — I4891 Unspecified atrial fibrillation: Secondary | ICD-10-CM | POA: Diagnosis not present

## 2019-10-01 DIAGNOSIS — D649 Anemia, unspecified: Secondary | ICD-10-CM | POA: Diagnosis not present

## 2019-10-01 DIAGNOSIS — E569 Vitamin deficiency, unspecified: Secondary | ICD-10-CM | POA: Diagnosis not present

## 2019-10-01 DIAGNOSIS — M6281 Muscle weakness (generalized): Secondary | ICD-10-CM | POA: Diagnosis not present

## 2019-10-01 DIAGNOSIS — M109 Gout, unspecified: Secondary | ICD-10-CM | POA: Diagnosis not present

## 2019-10-01 DIAGNOSIS — R498 Other voice and resonance disorders: Secondary | ICD-10-CM | POA: Diagnosis not present

## 2019-10-01 DIAGNOSIS — I10 Essential (primary) hypertension: Secondary | ICD-10-CM | POA: Diagnosis not present

## 2019-10-01 DIAGNOSIS — N189 Chronic kidney disease, unspecified: Secondary | ICD-10-CM | POA: Diagnosis not present

## 2019-10-01 DIAGNOSIS — M6250 Muscle wasting and atrophy, not elsewhere classified, unspecified site: Secondary | ICD-10-CM | POA: Diagnosis not present

## 2019-10-01 DIAGNOSIS — C9110 Chronic lymphocytic leukemia of B-cell type not having achieved remission: Secondary | ICD-10-CM | POA: Diagnosis not present

## 2019-10-01 DIAGNOSIS — R6889 Other general symptoms and signs: Secondary | ICD-10-CM | POA: Diagnosis not present

## 2019-10-01 DIAGNOSIS — I5030 Unspecified diastolic (congestive) heart failure: Secondary | ICD-10-CM | POA: Diagnosis not present

## 2019-10-01 DIAGNOSIS — R52 Pain, unspecified: Secondary | ICD-10-CM | POA: Diagnosis not present

## 2019-10-01 DIAGNOSIS — J9600 Acute respiratory failure, unspecified whether with hypoxia or hypercapnia: Secondary | ICD-10-CM | POA: Diagnosis not present

## 2019-10-01 DIAGNOSIS — R488 Other symbolic dysfunctions: Secondary | ICD-10-CM | POA: Diagnosis not present

## 2019-10-01 DIAGNOSIS — Z9981 Dependence on supplemental oxygen: Secondary | ICD-10-CM | POA: Diagnosis not present

## 2019-10-01 DIAGNOSIS — J80 Acute respiratory distress syndrome: Secondary | ICD-10-CM | POA: Diagnosis not present

## 2019-10-07 DIAGNOSIS — J9600 Acute respiratory failure, unspecified whether with hypoxia or hypercapnia: Secondary | ICD-10-CM | POA: Diagnosis not present

## 2019-10-07 DIAGNOSIS — I4891 Unspecified atrial fibrillation: Secondary | ICD-10-CM | POA: Diagnosis not present

## 2019-10-07 DIAGNOSIS — C9110 Chronic lymphocytic leukemia of B-cell type not having achieved remission: Secondary | ICD-10-CM | POA: Diagnosis not present

## 2019-10-15 DIAGNOSIS — I4891 Unspecified atrial fibrillation: Secondary | ICD-10-CM | POA: Diagnosis not present

## 2019-10-15 DIAGNOSIS — J9600 Acute respiratory failure, unspecified whether with hypoxia or hypercapnia: Secondary | ICD-10-CM | POA: Diagnosis not present

## 2019-10-15 DIAGNOSIS — C9110 Chronic lymphocytic leukemia of B-cell type not having achieved remission: Secondary | ICD-10-CM | POA: Diagnosis not present

## 2019-10-19 DIAGNOSIS — G4700 Insomnia, unspecified: Secondary | ICD-10-CM | POA: Diagnosis not present

## 2019-10-19 DIAGNOSIS — I129 Hypertensive chronic kidney disease with stage 1 through stage 4 chronic kidney disease, or unspecified chronic kidney disease: Secondary | ICD-10-CM | POA: Diagnosis not present

## 2019-10-19 DIAGNOSIS — J9691 Respiratory failure, unspecified with hypoxia: Secondary | ICD-10-CM | POA: Diagnosis not present

## 2019-10-19 DIAGNOSIS — Z87891 Personal history of nicotine dependence: Secondary | ICD-10-CM | POA: Diagnosis not present

## 2019-10-19 DIAGNOSIS — I4891 Unspecified atrial fibrillation: Secondary | ICD-10-CM | POA: Diagnosis not present

## 2019-10-19 DIAGNOSIS — N183 Chronic kidney disease, stage 3 unspecified: Secondary | ICD-10-CM | POA: Diagnosis not present

## 2019-10-19 DIAGNOSIS — M109 Gout, unspecified: Secondary | ICD-10-CM | POA: Diagnosis not present

## 2019-10-19 DIAGNOSIS — Z7901 Long term (current) use of anticoagulants: Secondary | ICD-10-CM | POA: Diagnosis not present

## 2019-10-19 DIAGNOSIS — R531 Weakness: Secondary | ICD-10-CM | POA: Diagnosis not present

## 2019-10-21 DIAGNOSIS — N183 Chronic kidney disease, stage 3 unspecified: Secondary | ICD-10-CM | POA: Diagnosis not present

## 2019-10-21 DIAGNOSIS — R531 Weakness: Secondary | ICD-10-CM | POA: Diagnosis not present

## 2019-10-21 DIAGNOSIS — J9691 Respiratory failure, unspecified with hypoxia: Secondary | ICD-10-CM | POA: Diagnosis not present

## 2019-10-21 DIAGNOSIS — M109 Gout, unspecified: Secondary | ICD-10-CM | POA: Diagnosis not present

## 2019-10-21 DIAGNOSIS — Z7901 Long term (current) use of anticoagulants: Secondary | ICD-10-CM | POA: Diagnosis not present

## 2019-10-21 DIAGNOSIS — G4700 Insomnia, unspecified: Secondary | ICD-10-CM | POA: Diagnosis not present

## 2019-10-21 DIAGNOSIS — I4891 Unspecified atrial fibrillation: Secondary | ICD-10-CM | POA: Diagnosis not present

## 2019-10-21 DIAGNOSIS — Z87891 Personal history of nicotine dependence: Secondary | ICD-10-CM | POA: Diagnosis not present

## 2019-10-21 DIAGNOSIS — I129 Hypertensive chronic kidney disease with stage 1 through stage 4 chronic kidney disease, or unspecified chronic kidney disease: Secondary | ICD-10-CM | POA: Diagnosis not present

## 2019-10-23 DIAGNOSIS — J9691 Respiratory failure, unspecified with hypoxia: Secondary | ICD-10-CM | POA: Diagnosis not present

## 2019-10-23 DIAGNOSIS — G4700 Insomnia, unspecified: Secondary | ICD-10-CM | POA: Diagnosis not present

## 2019-10-23 DIAGNOSIS — I4891 Unspecified atrial fibrillation: Secondary | ICD-10-CM | POA: Diagnosis not present

## 2019-10-23 DIAGNOSIS — R531 Weakness: Secondary | ICD-10-CM | POA: Diagnosis not present

## 2019-10-23 DIAGNOSIS — Z87891 Personal history of nicotine dependence: Secondary | ICD-10-CM | POA: Diagnosis not present

## 2019-10-23 DIAGNOSIS — I129 Hypertensive chronic kidney disease with stage 1 through stage 4 chronic kidney disease, or unspecified chronic kidney disease: Secondary | ICD-10-CM | POA: Diagnosis not present

## 2019-10-23 DIAGNOSIS — M109 Gout, unspecified: Secondary | ICD-10-CM | POA: Diagnosis not present

## 2019-10-23 DIAGNOSIS — Z7901 Long term (current) use of anticoagulants: Secondary | ICD-10-CM | POA: Diagnosis not present

## 2019-10-23 DIAGNOSIS — N183 Chronic kidney disease, stage 3 unspecified: Secondary | ICD-10-CM | POA: Diagnosis not present

## 2019-10-27 DIAGNOSIS — R531 Weakness: Secondary | ICD-10-CM | POA: Diagnosis not present

## 2019-10-27 DIAGNOSIS — Z7901 Long term (current) use of anticoagulants: Secondary | ICD-10-CM | POA: Diagnosis not present

## 2019-10-27 DIAGNOSIS — N183 Chronic kidney disease, stage 3 unspecified: Secondary | ICD-10-CM | POA: Diagnosis not present

## 2019-10-27 DIAGNOSIS — I4891 Unspecified atrial fibrillation: Secondary | ICD-10-CM | POA: Diagnosis not present

## 2019-10-27 DIAGNOSIS — Z87891 Personal history of nicotine dependence: Secondary | ICD-10-CM | POA: Diagnosis not present

## 2019-10-27 DIAGNOSIS — I129 Hypertensive chronic kidney disease with stage 1 through stage 4 chronic kidney disease, or unspecified chronic kidney disease: Secondary | ICD-10-CM | POA: Diagnosis not present

## 2019-10-27 DIAGNOSIS — G4700 Insomnia, unspecified: Secondary | ICD-10-CM | POA: Diagnosis not present

## 2019-10-27 DIAGNOSIS — M109 Gout, unspecified: Secondary | ICD-10-CM | POA: Diagnosis not present

## 2019-10-27 DIAGNOSIS — J9691 Respiratory failure, unspecified with hypoxia: Secondary | ICD-10-CM | POA: Diagnosis not present

## 2019-10-29 ENCOUNTER — Encounter: Payer: Self-pay | Admitting: Family Medicine

## 2019-10-29 ENCOUNTER — Other Ambulatory Visit: Payer: Self-pay

## 2019-10-29 ENCOUNTER — Ambulatory Visit (INDEPENDENT_AMBULATORY_CARE_PROVIDER_SITE_OTHER): Payer: Medicare Other | Admitting: Family Medicine

## 2019-10-29 VITALS — BP 129/76 | HR 80 | Temp 97.1°F | Resp 16 | Ht 67.0 in | Wt 187.0 lb

## 2019-10-29 DIAGNOSIS — R7303 Prediabetes: Secondary | ICD-10-CM

## 2019-10-29 DIAGNOSIS — N183 Chronic kidney disease, stage 3 unspecified: Secondary | ICD-10-CM

## 2019-10-29 DIAGNOSIS — I129 Hypertensive chronic kidney disease with stage 1 through stage 4 chronic kidney disease, or unspecified chronic kidney disease: Secondary | ICD-10-CM

## 2019-10-29 DIAGNOSIS — M1A0610 Idiopathic chronic gout, right knee, without tophus (tophi): Secondary | ICD-10-CM

## 2019-10-29 DIAGNOSIS — I4821 Permanent atrial fibrillation: Secondary | ICD-10-CM | POA: Diagnosis not present

## 2019-10-29 DIAGNOSIS — N1831 Chronic kidney disease, stage 3a: Secondary | ICD-10-CM

## 2019-10-29 DIAGNOSIS — I872 Venous insufficiency (chronic) (peripheral): Secondary | ICD-10-CM | POA: Diagnosis not present

## 2019-10-29 MED ORDER — APIXABAN 2.5 MG PO TABS: 2.5000 mg | ORAL_TABLET | Freq: Two times a day (BID) | ORAL | 3 refills | Status: DC

## 2019-10-29 MED ORDER — FUROSEMIDE 40 MG PO TABS: 20.0000 mg | ORAL_TABLET | Freq: Every day | ORAL | 3 refills | Status: DC | PRN

## 2019-10-29 NOTE — Patient Instructions (Addendum)
Thank you for coming to the office today.  Due to heart rhythm the Atrial Fibrillation and Blood Thinner medicine (Eliquis) - I recommend consultation with a Cardiologist here locally.  First call the insurance and check if this would be a covered referral - (check for - Dr Ida Rogue or Dr Nelva Bush)  [facility]) [facility]Care at [address] [place], [place] [address] Main: [phone]   For now continue Eliquis 2.5mg  twice a day. Re order this med.  Also can monitor BP readings at home.  BP Meds - STOP Lisinopril - STOP Amlodipine - RESTART (home medicine) Atenolol at HALF DOSE - cut in half. Previously 50 now should be 25mg , if BP numbers are too low, < 100/60, or feel very tired or fatigued on this medicine, can STOP it.  For Fluid Pill - Furosemide (lasix) 40mg  - may be very flexible with this pill - If a lot of swelling - take 1 whole one a day - If little swelling - take half one a day - if no swelling - or want to skip a day, can skip.  For Gout STOP Allopurinol (home medicine) -------------  Call to check status of upcoming appointment.  [facility] HEMATOLOGY ONCOLOGY [address] [facility]  [address], [place] [address]  [phone]     Please schedule a Follow-up Appointment to: Return in about 3 months (around 01/29/2020) for 3 month follow-up HTN, Edema, PreDM A1c.  If you have any other questions or concerns, please feel free to call the office or send a message through [facility]. You may also schedule an earlier appointment if necessary.  Additionally, you may be receiving a survey about your experience at our office within a few days to 1 week by e-mail or mail. We value your feedback.  Nobie Putnam, DO [place]

## 2019-10-29 NOTE — Progress Notes (Addendum)
Subjective:    Patient ID: Robert [name], male    DOB: [birth year], 86 y.o.   MRN: [id number]  Robert [name] is a 86 y.o. male presenting on 10/29/2019 for Hospitalization Follow-up (was at [facility])   HPI   Hospitalized with COVID19 08/2019, treated with anti viral medicine and steroid courses, then re-infection with COVID19 Pneumonia and superimposed bacterial pneumonia with respiratory failure. Initially in ICU at [facility] in 09/12/19 Second hospitalization - [facility] Admit 09/11/19. Discharged 10/01/19 Dischargd to [facility] Completed off of oxygen supplement. Has upcoming Sleep Study next week. Overall improving. Admits weight loss  CLL He has to schedule upcoming apt w/ [facility] Hematology as advised.  Atrial Fibrillation New problem dx in hospital. Not seen Cardiology. Had bleeding episode hemoptysis, reduced to Eliquis 2.5mg  BID, needs re order. He was on rate control BB but then taken off in hospital. Still has it at home. Atenolol 50mg   CHRONIC HTN / CKD-III Now off all HTN meds except Furosemide. Doing well. Normal BP. Not checking regularly at home however Denies CP, dyspnea, HA, dizziness / lightheadedness  Follow-up Knee Pain /Bilateral Gout Arthritis No longer on Meloxicam or Allopurinol 300mg . Has not had any gout flares. Denies injury recent fall or trauma, redness, swelling, numb tingling  Pre-Diabetes Due for A1c upcoming, will get blood work. No new concerns today.  Peripheral Edema / Venous Stasis Chronic problem now seems to be worsening R>L lower extremity with edema and chronic skin changes. Seems to affect the swelling in his R knee, see above. Improved on Furosemide dosing, this was adjusted in hospital.  Depression screen [facility] 2/9 10/29/2019 06/04/2019 06/03/2019  Decreased Interest 0 0 0  Down, Depressed, Hopeless 0 0 0  PHQ - 2 Score 0 0 0    Social History   Tobacco Use  . Smoking status: Former Smoker    Quit date: 1985    Years since  quitting: 36.2  . Smokeless tobacco: Former Network engineer Use Topics  . Alcohol use: Never  . Drug use: Never    Review of Systems Per HPI unless specifically indicated above     Objective:    BP 129/76   Pulse 80   Temp (!) 97.1 F (36.2 C) (Temporal)   Resp 16   Ht 5\' 7"  (1.702 m)   Wt 187 lb (84.8 kg)   SpO2 100%   BMI 29.29 kg/m   Wt Readings from Last 3 Encounters:  10/29/19 187 lb (84.8 kg)  06/18/19 204 lb 9.6 oz (92.8 kg)  06/13/19 207 lb 9.6 oz (94.2 kg)    Physical Exam Vitals and nursing note reviewed.  Constitutional:      General: He is not in acute distress.    Appearance: He is well-developed. He is not diaphoretic.     Comments: Well-appearing, comfortable, cooperative  HENT:     Head: Normocephalic and atraumatic.  Eyes:     General:        Right eye: No discharge.        Left eye: No discharge.     Conjunctiva/sclera: Conjunctivae normal.  Neck:     Thyroid: No thyromegaly.  Cardiovascular:     Rate and Rhythm: Normal rate.     Heart sounds: Normal heart sounds. No murmur.     Comments: Some irregular beats and ectopy, doesn't seem to be persistently irregular however Pulmonary:     Effort: Pulmonary effort is normal. No respiratory distress.  Breath sounds: No wheezing or rales.     Comments: Mild reduced air movement but seems improved Musculoskeletal:        General: Normal range of motion.     Cervical back: Normal range of motion and neck supple.     Right lower leg: Edema (trace pitting edema) present.     Left lower leg: Edema (trace pitting edema) present.  Lymphadenopathy:     Cervical: No cervical adenopathy.  Skin:    General: Skin is warm and dry.     Findings: No erythema or rash.  Neurological:     Mental Status: He is alert and oriented to person, place, and time.  Psychiatric:        Behavior: Behavior normal.     Comments: Well groomed, good eye contact, normal speech and thoughts       Results for orders  placed or performed in visit on 05/26/19  Hemoglobin A1c  Result Value Ref Range   Hgb A1c MFr Bld 5.6 <5.7 % of total Hgb   Mean Plasma Glucose 114 (calc)   eAG (mmol/L) 6.3 (calc)  COMPLETE METABOLIC PANEL WITH GFR  Result Value Ref Range   Glucose, Bld 103 65 - 139 mg/dL   BUN 17 7 - 25 mg/dL   Creat 1.27 (H) 0.70 - 1.11 mg/dL   GFR, Est Non African American 51 (L) > OR = 60 mL/min/1.8m2   GFR, Est African American 59 (L) > OR = 60 mL/min/1.76m2   BUN/Creatinine Ratio 13 6 - 22 (calc)   Sodium 143 135 - 146 mmol/L   Potassium 4.1 3.5 - 5.3 mmol/L   Chloride 108 98 - 110 mmol/L   CO2 24 20 - 32 mmol/L   Calcium 9.4 8.6 - 10.3 mg/dL   Total Protein 7.1 6.1 - 8.1 g/dL   Albumin 4.5 3.6 - 5.1 g/dL   Globulin 2.6 1.9 - 3.7 g/dL (calc)   AG Ratio 1.7 1.0 - 2.5 (calc)   Total Bilirubin 1.6 (H) 0.2 - 1.2 mg/dL   Alkaline phosphatase (APISO) 86 35 - 144 U/L   AST 22 10 - 35 U/L   ALT 13 9 - 46 U/L  CBC with Differential/Platelet  Result Value Ref Range   WBC 16.9 (H) 3.8 - 10.8 Thousand/uL   RBC 5.35 4.20 - 5.80 Million/uL   Hemoglobin 12.7 (L) 13.2 - 17.1 g/dL   HCT 40.2 38.5 - 50.0 %   MCV 75.1 (L) 80.0 - 100.0 fL   MCH 23.7 (L) 27.0 - 33.0 pg   MCHC 31.6 (L) 32.0 - 36.0 g/dL   RDW 18.5 (H) 11.0 - 15.0 %   Platelets 247 140 - 400 Thousand/uL   MPV 10.3 7.5 - 12.5 fL   Neutro Abs 5,256 1,500 - 7,800 cells/uL   Lymphs Abs 9,599 (H) 850 - 3,900 cells/uL   Absolute Monocytes 1,572 (H) 200 - 950 cells/uL   Eosinophils Absolute 355 15 - 500 cells/uL   Basophils Absolute 118 0 - 200 cells/uL   Neutrophils Relative % 31.1 %   Total Lymphocyte 56.8 %   Monocytes Relative 9.3 %   Eosinophils Relative 2.1 %   Basophils Relative 0.7 %   Smear Review    Uric acid  Result Value Ref Range   Uric Acid, Serum 5.5 4.0 - 8.0 mg/dL      Assessment & Plan:   Problem List Items Addressed This Visit    Pre-diabetes   Relevant Orders   Hemoglobin  A1c   Gout   Relevant Orders    Uric acid   CKD (chronic kidney disease), stage III   Relevant Orders   CBC with Differential/Platelet   Chronic venous insufficiency   Relevant Medications   atenolol (TENORMIN) 50 MG tablet   furosemide (LASIX) 40 MG tablet   apixaban (ELIQUIS) 2.5 MG TABS tablet   Benign hypertension with CKD (chronic kidney disease) stage III - Primary   Relevant Medications   atenolol (TENORMIN) 50 MG tablet   furosemide (LASIX) 40 MG tablet   apixaban (ELIQUIS) 2.5 MG TABS tablet   Other Relevant Orders   COMPLETE METABOLIC PANEL WITH GFR   CBC with Differential/Platelet   Atrial fibrillation (HCC)   Relevant Medications   atenolol (TENORMIN) 50 MG tablet   furosemide (LASIX) 40 MG tablet   apixaban (ELIQUIS) 2.5 MG TABS tablet      #Atrial Fibrillation New diagnosis in hospitalization w/ COVID / PNA acute illness Still mildly irregular on exam today Will continue Eliquis from hospital at 2.5mg  BID for now. He has CKD, will recheck CMET Cr No further bleeding Resume low dose rate control for HTN as well now out of hospital, home Atenolol 50mg  can reduce by half for 25mg  daily for now. Advised that he should consult a Cardiologist about anticoagulation and further evaluation of this problem, they will check w insurance on coverage from specialist and let me know if ready for referral and to which location  #Edema Continues on Furosemide - reduce dose to more PRN usage, new order 40mg  can do 20-40 or skip dose  #HTN CKD III Check lab Cr Controlled off meds Will resume Atenolol half dose and also Furosemide half dose.  #Gout Check Uric acid Remain off Allopurinol has been off for >2+ months no gout flare May resume low dose 100 or remain off  #PreDM Check A1c, overdue  #COVID19, Pnuemonia, Respiratory Failure - RESOLVED Keep improving strength and respiratory function w/ incentive spirometer  #CLL F/u with [facility] Hematology/Oncology  Meds ordered this encounter  Medications  .  furosemide (LASIX) 40 MG tablet    Sig: Take 0.5-1 tablets (20-40 mg total) by mouth daily as needed for fluid or edema.    Dispense:  30 tablet    Refill:  3  . apixaban (ELIQUIS) 2.5 MG TABS tablet    Sig: Take 1 tablet (2.5 mg total) by mouth 2 (two) times daily.    Dispense:  60 tablet    Refill:  3      Follow up plan: Return in about 3 months (around 01/29/2020) for 3 month follow-up HTN, Edema, PreDM A1c.   Nobie Putnam, [facility] 10/29/2019, 8:21 AM

## 2019-10-30 DIAGNOSIS — M109 Gout, unspecified: Secondary | ICD-10-CM | POA: Diagnosis not present

## 2019-10-30 DIAGNOSIS — Z7901 Long term (current) use of anticoagulants: Secondary | ICD-10-CM | POA: Diagnosis not present

## 2019-10-30 DIAGNOSIS — G4700 Insomnia, unspecified: Secondary | ICD-10-CM | POA: Diagnosis not present

## 2019-10-30 DIAGNOSIS — J9691 Respiratory failure, unspecified with hypoxia: Secondary | ICD-10-CM | POA: Diagnosis not present

## 2019-10-30 DIAGNOSIS — R531 Weakness: Secondary | ICD-10-CM | POA: Diagnosis not present

## 2019-10-30 DIAGNOSIS — Z87891 Personal history of nicotine dependence: Secondary | ICD-10-CM | POA: Diagnosis not present

## 2019-10-30 DIAGNOSIS — I4891 Unspecified atrial fibrillation: Secondary | ICD-10-CM | POA: Diagnosis not present

## 2019-10-30 DIAGNOSIS — N183 Chronic kidney disease, stage 3 unspecified: Secondary | ICD-10-CM | POA: Diagnosis not present

## 2019-10-30 DIAGNOSIS — I129 Hypertensive chronic kidney disease with stage 1 through stage 4 chronic kidney disease, or unspecified chronic kidney disease: Secondary | ICD-10-CM | POA: Diagnosis not present

## 2019-10-30 LAB — CBC WITH DIFFERENTIAL/PLATELET
Absolute Monocytes: 2570 cells/uL — ABNORMAL HIGH (ref 200–950)
Basophils Absolute: 158 cells/uL (ref 0–200)
Basophils Relative: 0.9 %
Eosinophils Absolute: 387 cells/uL (ref 15–500)
Eosinophils Relative: 2.2 %
HCT: 38.8 % (ref 38.5–50.0)
Hemoglobin: 12.1 g/dL — ABNORMAL LOW (ref 13.2–17.1)
Lymphs Abs: 8712 cells/uL — ABNORMAL HIGH (ref 850–3900)
MCH: 23.2 pg — ABNORMAL LOW (ref 27.0–33.0)
MCHC: 31.2 g/dL — ABNORMAL LOW (ref 32.0–36.0)
MCV: 74.3 fL — ABNORMAL LOW (ref 80.0–100.0)
MPV: 10.9 fL (ref 7.5–12.5)
Monocytes Relative: 14.6 %
Neutro Abs: 5773 cells/uL (ref 1500–7800)
Neutrophils Relative %: 32.8 %
Platelets: 248 10*3/uL (ref 140–400)
RBC: 5.22 10*6/uL (ref 4.20–5.80)
RDW: 17.3 % — ABNORMAL HIGH (ref 11.0–15.0)
Total Lymphocyte: 49.5 %
WBC: 17.6 10*3/uL — ABNORMAL HIGH (ref 3.8–10.8)

## 2019-10-30 LAB — COMPLETE METABOLIC PANEL WITH GFR
AG Ratio: 1.7 (calc) (ref 1.0–2.5)
ALT: 13 U/L (ref 9–46)
AST: 24 U/L (ref 10–35)
Albumin: 4 g/dL (ref 3.6–5.1)
Alkaline phosphatase (APISO): 65 U/L (ref 35–144)
BUN/Creatinine Ratio: 14 (calc) (ref 6–22)
BUN: 21 mg/dL (ref 7–25)
CO2: 30 mmol/L (ref 20–32)
Calcium: 9 mg/dL (ref 8.6–10.3)
Chloride: 103 mmol/L (ref 98–110)
Creat: 1.54 mg/dL — ABNORMAL HIGH (ref 0.70–1.11)
GFR, Est African American: 47 mL/min/{1.73_m2} — ABNORMAL LOW (ref >=60)
GFR, Est Non African American: 40 mL/min/{1.73_m2} — ABNORMAL LOW (ref >=60)
Globulin: 2.4 g/dL (calc) (ref 1.9–3.7)
Glucose, Bld: 94 mg/dL (ref 65–99)
Potassium: 3.6 mmol/L (ref 3.5–5.3)
Sodium: 138 mmol/L (ref 135–146)
Total Bilirubin: 0.8 mg/dL (ref 0.2–1.2)
Total Protein: 6.4 g/dL (ref 6.1–8.1)

## 2019-10-30 LAB — URIC ACID: Uric Acid, Serum: 12.7 mg/dL — ABNORMAL HIGH (ref 4.0–8.0)

## 2019-10-30 LAB — HEMOGLOBIN A1C
Hgb A1c MFr Bld: 5.8 % of total Hgb — ABNORMAL HIGH (ref <5.7)
Mean Plasma Glucose: 120 (calc)
eAG (mmol/L): 6.6 (calc)

## 2019-10-31 DIAGNOSIS — M109 Gout, unspecified: Secondary | ICD-10-CM | POA: Diagnosis not present

## 2019-10-31 DIAGNOSIS — Z87891 Personal history of nicotine dependence: Secondary | ICD-10-CM | POA: Diagnosis not present

## 2019-10-31 DIAGNOSIS — J9691 Respiratory failure, unspecified with hypoxia: Secondary | ICD-10-CM | POA: Diagnosis not present

## 2019-10-31 DIAGNOSIS — Z7901 Long term (current) use of anticoagulants: Secondary | ICD-10-CM | POA: Diagnosis not present

## 2019-10-31 DIAGNOSIS — G4700 Insomnia, unspecified: Secondary | ICD-10-CM | POA: Diagnosis not present

## 2019-10-31 DIAGNOSIS — I129 Hypertensive chronic kidney disease with stage 1 through stage 4 chronic kidney disease, or unspecified chronic kidney disease: Secondary | ICD-10-CM | POA: Diagnosis not present

## 2019-10-31 DIAGNOSIS — R531 Weakness: Secondary | ICD-10-CM | POA: Diagnosis not present

## 2019-10-31 DIAGNOSIS — N183 Chronic kidney disease, stage 3 unspecified: Secondary | ICD-10-CM | POA: Diagnosis not present

## 2019-10-31 DIAGNOSIS — I4891 Unspecified atrial fibrillation: Secondary | ICD-10-CM | POA: Diagnosis not present

## 2019-11-01 DIAGNOSIS — Z01812 Encounter for preprocedural laboratory examination: Secondary | ICD-10-CM | POA: Diagnosis not present

## 2019-11-01 DIAGNOSIS — Z20822 Contact with and (suspected) exposure to covid-19: Secondary | ICD-10-CM | POA: Diagnosis not present

## 2019-11-03 DIAGNOSIS — Z66 Do not resuscitate: Secondary | ICD-10-CM | POA: Diagnosis not present

## 2019-11-03 DIAGNOSIS — Z8616 Personal history of COVID-19: Secondary | ICD-10-CM | POA: Diagnosis not present

## 2019-11-03 DIAGNOSIS — J9600 Acute respiratory failure, unspecified whether with hypoxia or hypercapnia: Secondary | ICD-10-CM | POA: Diagnosis not present

## 2019-11-03 DIAGNOSIS — G4733 Obstructive sleep apnea (adult) (pediatric): Secondary | ICD-10-CM | POA: Diagnosis not present

## 2019-11-05 DIAGNOSIS — J9691 Respiratory failure, unspecified with hypoxia: Secondary | ICD-10-CM | POA: Diagnosis not present

## 2019-11-05 DIAGNOSIS — R531 Weakness: Secondary | ICD-10-CM | POA: Diagnosis not present

## 2019-11-05 DIAGNOSIS — Z87891 Personal history of nicotine dependence: Secondary | ICD-10-CM | POA: Diagnosis not present

## 2019-11-05 DIAGNOSIS — I129 Hypertensive chronic kidney disease with stage 1 through stage 4 chronic kidney disease, or unspecified chronic kidney disease: Secondary | ICD-10-CM | POA: Diagnosis not present

## 2019-11-05 DIAGNOSIS — Z7901 Long term (current) use of anticoagulants: Secondary | ICD-10-CM | POA: Diagnosis not present

## 2019-11-05 DIAGNOSIS — I4891 Unspecified atrial fibrillation: Secondary | ICD-10-CM | POA: Diagnosis not present

## 2019-11-05 DIAGNOSIS — M109 Gout, unspecified: Secondary | ICD-10-CM | POA: Diagnosis not present

## 2019-11-05 DIAGNOSIS — G4700 Insomnia, unspecified: Secondary | ICD-10-CM | POA: Diagnosis not present

## 2019-11-05 DIAGNOSIS — N183 Chronic kidney disease, stage 3 unspecified: Secondary | ICD-10-CM | POA: Diagnosis not present

## 2019-11-07 DIAGNOSIS — R531 Weakness: Secondary | ICD-10-CM | POA: Diagnosis not present

## 2019-11-07 DIAGNOSIS — I4891 Unspecified atrial fibrillation: Secondary | ICD-10-CM | POA: Diagnosis not present

## 2019-11-07 DIAGNOSIS — N183 Chronic kidney disease, stage 3 unspecified: Secondary | ICD-10-CM | POA: Diagnosis not present

## 2019-11-07 DIAGNOSIS — G4700 Insomnia, unspecified: Secondary | ICD-10-CM | POA: Diagnosis not present

## 2019-11-07 DIAGNOSIS — I129 Hypertensive chronic kidney disease with stage 1 through stage 4 chronic kidney disease, or unspecified chronic kidney disease: Secondary | ICD-10-CM | POA: Diagnosis not present

## 2019-11-07 DIAGNOSIS — Z87891 Personal history of nicotine dependence: Secondary | ICD-10-CM | POA: Diagnosis not present

## 2019-11-07 DIAGNOSIS — M109 Gout, unspecified: Secondary | ICD-10-CM | POA: Diagnosis not present

## 2019-11-07 DIAGNOSIS — J9691 Respiratory failure, unspecified with hypoxia: Secondary | ICD-10-CM | POA: Diagnosis not present

## 2019-11-07 DIAGNOSIS — Z7901 Long term (current) use of anticoagulants: Secondary | ICD-10-CM | POA: Diagnosis not present

## 2019-11-11 DIAGNOSIS — N183 Chronic kidney disease, stage 3 unspecified: Secondary | ICD-10-CM | POA: Diagnosis not present

## 2019-11-11 DIAGNOSIS — Z87891 Personal history of nicotine dependence: Secondary | ICD-10-CM | POA: Diagnosis not present

## 2019-11-11 DIAGNOSIS — Z7901 Long term (current) use of anticoagulants: Secondary | ICD-10-CM | POA: Diagnosis not present

## 2019-11-11 DIAGNOSIS — I129 Hypertensive chronic kidney disease with stage 1 through stage 4 chronic kidney disease, or unspecified chronic kidney disease: Secondary | ICD-10-CM | POA: Diagnosis not present

## 2019-11-11 DIAGNOSIS — G4700 Insomnia, unspecified: Secondary | ICD-10-CM | POA: Diagnosis not present

## 2019-11-11 DIAGNOSIS — M109 Gout, unspecified: Secondary | ICD-10-CM | POA: Diagnosis not present

## 2019-11-11 DIAGNOSIS — J9691 Respiratory failure, unspecified with hypoxia: Secondary | ICD-10-CM | POA: Diagnosis not present

## 2019-11-11 DIAGNOSIS — I4891 Unspecified atrial fibrillation: Secondary | ICD-10-CM | POA: Diagnosis not present

## 2019-11-11 DIAGNOSIS — R531 Weakness: Secondary | ICD-10-CM | POA: Diagnosis not present

## 2019-11-13 DIAGNOSIS — M109 Gout, unspecified: Secondary | ICD-10-CM | POA: Diagnosis not present

## 2019-11-13 DIAGNOSIS — N183 Chronic kidney disease, stage 3 unspecified: Secondary | ICD-10-CM | POA: Diagnosis not present

## 2019-11-13 DIAGNOSIS — G4700 Insomnia, unspecified: Secondary | ICD-10-CM | POA: Diagnosis not present

## 2019-11-13 DIAGNOSIS — Z7901 Long term (current) use of anticoagulants: Secondary | ICD-10-CM | POA: Diagnosis not present

## 2019-11-13 DIAGNOSIS — I129 Hypertensive chronic kidney disease with stage 1 through stage 4 chronic kidney disease, or unspecified chronic kidney disease: Secondary | ICD-10-CM | POA: Diagnosis not present

## 2019-11-13 DIAGNOSIS — Z87891 Personal history of nicotine dependence: Secondary | ICD-10-CM | POA: Diagnosis not present

## 2019-11-13 DIAGNOSIS — I4891 Unspecified atrial fibrillation: Secondary | ICD-10-CM | POA: Diagnosis not present

## 2019-11-13 DIAGNOSIS — J9691 Respiratory failure, unspecified with hypoxia: Secondary | ICD-10-CM | POA: Diagnosis not present

## 2019-11-13 DIAGNOSIS — R531 Weakness: Secondary | ICD-10-CM | POA: Diagnosis not present

## 2019-11-17 DIAGNOSIS — M109 Gout, unspecified: Secondary | ICD-10-CM | POA: Diagnosis not present

## 2019-11-17 DIAGNOSIS — N183 Chronic kidney disease, stage 3 unspecified: Secondary | ICD-10-CM | POA: Diagnosis not present

## 2019-11-17 DIAGNOSIS — G4700 Insomnia, unspecified: Secondary | ICD-10-CM | POA: Diagnosis not present

## 2019-11-17 DIAGNOSIS — Z7901 Long term (current) use of anticoagulants: Secondary | ICD-10-CM | POA: Diagnosis not present

## 2019-11-17 DIAGNOSIS — Z87891 Personal history of nicotine dependence: Secondary | ICD-10-CM | POA: Diagnosis not present

## 2019-11-17 DIAGNOSIS — J9691 Respiratory failure, unspecified with hypoxia: Secondary | ICD-10-CM | POA: Diagnosis not present

## 2019-11-17 DIAGNOSIS — I4891 Unspecified atrial fibrillation: Secondary | ICD-10-CM | POA: Diagnosis not present

## 2019-11-17 DIAGNOSIS — I129 Hypertensive chronic kidney disease with stage 1 through stage 4 chronic kidney disease, or unspecified chronic kidney disease: Secondary | ICD-10-CM | POA: Diagnosis not present

## 2019-11-17 DIAGNOSIS — R531 Weakness: Secondary | ICD-10-CM | POA: Diagnosis not present

## 2019-11-18 DIAGNOSIS — N183 Chronic kidney disease, stage 3 unspecified: Secondary | ICD-10-CM | POA: Diagnosis not present

## 2019-11-18 DIAGNOSIS — I4891 Unspecified atrial fibrillation: Secondary | ICD-10-CM | POA: Diagnosis not present

## 2019-11-18 DIAGNOSIS — R531 Weakness: Secondary | ICD-10-CM | POA: Diagnosis not present

## 2019-11-18 DIAGNOSIS — I129 Hypertensive chronic kidney disease with stage 1 through stage 4 chronic kidney disease, or unspecified chronic kidney disease: Secondary | ICD-10-CM | POA: Diagnosis not present

## 2019-11-18 DIAGNOSIS — Z7901 Long term (current) use of anticoagulants: Secondary | ICD-10-CM | POA: Diagnosis not present

## 2019-11-18 DIAGNOSIS — M109 Gout, unspecified: Secondary | ICD-10-CM | POA: Diagnosis not present

## 2019-11-18 DIAGNOSIS — J9691 Respiratory failure, unspecified with hypoxia: Secondary | ICD-10-CM | POA: Diagnosis not present

## 2019-11-18 DIAGNOSIS — G4700 Insomnia, unspecified: Secondary | ICD-10-CM | POA: Diagnosis not present

## 2019-11-18 DIAGNOSIS — Z87891 Personal history of nicotine dependence: Secondary | ICD-10-CM | POA: Diagnosis not present

## 2019-11-19 ENCOUNTER — Telehealth: Payer: Self-pay | Admitting: Family Medicine

## 2019-11-19 NOTE — Telephone Encounter (Signed)
Close encounter 

## 2019-11-19 NOTE — Telephone Encounter (Signed)
Copied from [place] [id number]. Topic: General - Other >> Nov 19, 2019  9:46 AM Keene Breath wrote: Reason for CRM:  Verbal orders to extend OT 1xwk 4,  CB# [phone]

## 2019-11-19 NOTE — Telephone Encounter (Signed)
Left detail message. 

## 2019-11-19 NOTE — Telephone Encounter (Signed)
Please close encounter

## 2019-11-25 ENCOUNTER — Ambulatory Visit: Payer: Medicare Other | Admitting: Family Medicine

## 2019-11-25 ENCOUNTER — Other Ambulatory Visit: Payer: Self-pay

## 2019-11-26 ENCOUNTER — Ambulatory Visit (INDEPENDENT_AMBULATORY_CARE_PROVIDER_SITE_OTHER): Payer: Medicare Other | Admitting: Family Medicine

## 2019-11-26 ENCOUNTER — Telehealth: Payer: Self-pay | Admitting: Family Medicine

## 2019-11-26 ENCOUNTER — Encounter: Payer: Self-pay | Admitting: Family Medicine

## 2019-11-26 VITALS — BP 162/88 | HR 59 | Temp 97.5°F | Resp 16 | Ht 67.0 in | Wt 202.0 lb

## 2019-11-26 DIAGNOSIS — M25561 Pain in right knee: Secondary | ICD-10-CM

## 2019-11-26 DIAGNOSIS — M109 Gout, unspecified: Secondary | ICD-10-CM | POA: Diagnosis not present

## 2019-11-26 DIAGNOSIS — G8929 Other chronic pain: Secondary | ICD-10-CM | POA: Diagnosis not present

## 2019-11-26 DIAGNOSIS — N183 Chronic kidney disease, stage 3 unspecified: Secondary | ICD-10-CM | POA: Diagnosis not present

## 2019-11-26 DIAGNOSIS — R630 Anorexia: Secondary | ICD-10-CM | POA: Diagnosis not present

## 2019-11-26 DIAGNOSIS — M1A0610 Idiopathic chronic gout, right knee, without tophus (tophi): Secondary | ICD-10-CM | POA: Diagnosis not present

## 2019-11-26 DIAGNOSIS — I4891 Unspecified atrial fibrillation: Secondary | ICD-10-CM | POA: Diagnosis not present

## 2019-11-26 DIAGNOSIS — Z87891 Personal history of nicotine dependence: Secondary | ICD-10-CM | POA: Diagnosis not present

## 2019-11-26 DIAGNOSIS — G4700 Insomnia, unspecified: Secondary | ICD-10-CM | POA: Diagnosis not present

## 2019-11-26 DIAGNOSIS — I129 Hypertensive chronic kidney disease with stage 1 through stage 4 chronic kidney disease, or unspecified chronic kidney disease: Secondary | ICD-10-CM | POA: Diagnosis not present

## 2019-11-26 DIAGNOSIS — J9691 Respiratory failure, unspecified with hypoxia: Secondary | ICD-10-CM | POA: Diagnosis not present

## 2019-11-26 DIAGNOSIS — M1711 Unilateral primary osteoarthritis, right knee: Secondary | ICD-10-CM | POA: Diagnosis not present

## 2019-11-26 DIAGNOSIS — C9110 Chronic lymphocytic leukemia of B-cell type not having achieved remission: Secondary | ICD-10-CM

## 2019-11-26 DIAGNOSIS — Z7901 Long term (current) use of anticoagulants: Secondary | ICD-10-CM | POA: Diagnosis not present

## 2019-11-26 DIAGNOSIS — R531 Weakness: Secondary | ICD-10-CM | POA: Diagnosis not present

## 2019-11-26 MED ORDER — METHYLPREDNISOLONE ACETATE 40 MG/ML IJ SUSP
40.0000 mg | Freq: Once | INTRAMUSCULAR | Status: AC
  Administered 2019-11-26: 09:00:00 40 mg via INTRA_ARTICULAR

## 2019-11-26 MED ORDER — LIDOCAINE HCL (PF) 1 % IJ SOLN
4.0000 mL | Freq: Once | INTRAMUSCULAR | Status: AC
  Administered 2019-11-26: 4 mL

## 2019-11-26 MED ORDER — DICLOFENAC SODIUM 1 % EX GEL: 2.0000 g | Freq: Four times a day (QID) | CUTANEOUS | 2 refills | Status: DC | PRN

## 2019-11-26 NOTE — Patient Instructions (Addendum)
Thank you for coming to the office today.  You received a Right Knee Joint steroid injection today. - Lidocaine numbing medicine may ease the pain initially for a few hours until it wears off - As discussed, you may experience a "steroid flare" this evening or within 24-48 hours, anytime medicine is injected into an inflamed joint it can cause the pain to get worse temporarily - Everyone responds differently to these injections, it depends on the patient and the severity of the joint problem, it may provide anywhere from days to weeks, to months of relief. Ideal response is >6 months relief - Try to take it easy for next 1-2 days, avoid over activity and strain on joint (limit walking for knee) - Recommend the following:   - For swelling - rest, compression sleeve / ACE wrap, elevation, and ice packs as needed for first few days   - For pain in future may use heating pad or moist heat as needed  Medication  Try Diclofenac (voltaren) topical gel - use on knees up to 3-4 times a day as needed for arthritis pain  We may refer to Orthopedics if not improving - let us know if interested  For meal Try Ensure or Boost maybe once a day or occasionally if appetite is reduced, can use this as a supplement to a meal or between meal snack  Please schedule a Follow-up Appointment to: Return in about 3 months (around 02/25/2020) for 3 month R Knee Pain / Arthritis.  If you have any other questions or concerns, please feel free to call the office or send a message through [employer]. You may also schedule an earlier appointment if necessary.  Additionally, you may be receiving a survey about your experience at our office within a few days to 1 week by e-mail or mail. We value your feedback.  Nobie Putnam, DO [place]

## 2019-11-26 NOTE — Progress Notes (Signed)
Subjective:    Patient ID: Robert [name], male    DOB: 1932-05-17, 86 y.o.   MRN: [id number]  Robert [name] is a 86 y.o. male presenting on 11/26/2019 for Knee Pain (Right knee chronic) and Anorexia (loss of appetite)  History provided by both patient and granddaughter.  HPI   Follow-up Knee Pain RIGHT / Osteoarthritis /Bilateral Gout Arthritis - Last visit for this problem 05/2019,he hadprevious 11/2019x-rays knees that showed significant moderate to severe R>L DJD see result and he was started on meloxicam and given R knee steroid injection,see prior notes for background information. - He has since come off Meloxicam. Ineffective. Also he is on low dose Eliquis due to PAFib.  - Also for gout has had reduced flares, remains off Allopurinol - Today still complains of worse R knee joint pain, worse if active and pain at rest, has episodic flare up, limiting his function. Severe R knee pain can affect his breathing at times as well. - He has not seen Orthopedic. Chronic knee problem >10+ years Denies injury recent fall or trauma, redness, swelling, numb tingling  Reduced Appetite Additional complaint. Some reduced appetite, not always present. Can be worse sometimes, feels full early otherwise doing fairly well, he is still able to maintain nutrition. He is not on supplement. Weight fluctuates, gained weight now in past 2 months, since prior covid19 illness.  Depression screen [facility] 2/9 10/29/2019 06/04/2019 06/03/2019  Decreased Interest 0 0 0  Down, Depressed, Hopeless 0 0 0  PHQ - 2 Score 0 0 0    Social History   Tobacco Use  . Smoking status: Former Smoker    Quit date: 1985    Years since quitting: 36.3  . Smokeless tobacco: Former Network engineer Use Topics  . Alcohol use: Never  . Drug use: Never    Review of Systems Per HPI unless specifically indicated above     Objective:    BP (!) 162/88   Pulse (!) 59   Temp (!) 97.5 F (36.4 C) (Temporal)   Resp 16   Ht  5\' 7"  (1.702 m)   Wt 202 lb (91.6 kg)   BMI 31.64 kg/m   Wt Readings from Last 3 Encounters:  11/26/19 202 lb (91.6 kg)  10/29/19 187 lb (84.8 kg)  06/18/19 204 lb 9.6 oz (92.8 kg)    Physical Exam Vitals and nursing note reviewed.  Constitutional:      General: He is not in acute distress.    Appearance: He is well-developed. He is not diaphoretic.     Comments: Well-appearing, comfortable, cooperative  HENT:     Head: Normocephalic and atraumatic.  Eyes:     General:        Right eye: No discharge.        Left eye: No discharge.     Conjunctiva/sclera: Conjunctivae normal.  Neck:     Thyroid: No thyromegaly.  Cardiovascular:     Rate and Rhythm: Normal rate and regular rhythm.     Heart sounds: Normal heart sounds. No murmur.  Pulmonary:     Effort: Pulmonary effort is normal. No respiratory distress.     Breath sounds: Normal breath sounds. No wheezing or rales.     Comments: Good air movement. Musculoskeletal:        General: Normal range of motion.     Cervical back: Normal range of motion and neck supple.     Comments: Right knee, with bulky appearance, with some mild effusion compared  to Left Good range of motion but notable crepitus on exam Mild tender medial joint line  Lymphadenopathy:     Cervical: No cervical adenopathy.  Skin:    General: Skin is warm and dry.     Findings: No erythema or rash.  Neurological:     Mental Status: He is alert and oriented to person, place, and time.  Psychiatric:        Behavior: Behavior normal.     Comments: Well groomed, good eye contact, normal speech and thoughts     ________________________________________________________ PROCEDURE NOTE Date: 11/26/19 Right Knee steroid injection Discussed benefits and risks (including pain, bleeding, infection, steroid flare). Verbal consent given by patient. Medication:  1 cc Depo-medrol 40mg  and 4 cc Lidocaine 1% without epi Time Out taken  Landmarks identified. Area cleansed  with alcohol wipes. Using 21 gauge and 1, 1/2 inch needle, Right knee space was injected (with above listed medication) via medial approach cold spray used for superficial anesthetic. Sterile bandage placed. Patient tolerated procedure well without bleeding or paresthesias. No complications.   Results for orders placed or performed in visit on 10/29/19  Uric acid  Result Value Ref Range   Uric Acid, Serum 12.7 (H) 4.0 - 8.0 mg/dL  COMPLETE METABOLIC PANEL WITH GFR  Result Value Ref Range   Glucose, Bld 94 65 - 99 mg/dL   BUN 21 7 - 25 mg/dL   Creat 1.54 (H) 0.70 - 1.11 mg/dL   GFR, Est Non African American 40 (L) > OR = 60 mL/min/1.27m2   GFR, Est African American 47 (L) > OR = 60 mL/min/1.20m2   BUN/Creatinine Ratio 14 6 - 22 (calc)   Sodium 138 135 - 146 mmol/L   Potassium 3.6 3.5 - 5.3 mmol/L   Chloride 103 98 - 110 mmol/L   CO2 30 20 - 32 mmol/L   Calcium 9.0 8.6 - 10.3 mg/dL   Total Protein 6.4 6.1 - 8.1 g/dL   Albumin 4.0 3.6 - 5.1 g/dL   Globulin 2.4 1.9 - 3.7 g/dL (calc)   AG Ratio 1.7 1.0 - 2.5 (calc)   Total Bilirubin 0.8 0.2 - 1.2 mg/dL   Alkaline phosphatase (APISO) 65 35 - 144 U/L   AST 24 10 - 35 U/L   ALT 13 9 - 46 U/L  CBC with Differential/Platelet  Result Value Ref Range   WBC 17.6 (H) 3.8 - 10.8 Thousand/uL   RBC 5.22 4.20 - 5.80 Million/uL   Hemoglobin 12.1 (L) 13.2 - 17.1 g/dL   HCT 38.8 38.5 - 50.0 %   MCV 74.3 (L) 80.0 - 100.0 fL   MCH 23.2 (L) 27.0 - 33.0 pg   MCHC 31.2 (L) 32.0 - 36.0 g/dL   RDW 17.3 (H) 11.0 - 15.0 %   Platelets 248 140 - 400 Thousand/uL   MPV 10.9 7.5 - 12.5 fL   Neutro Abs 5,773 1,500 - 7,800 cells/uL   Lymphs Abs 8,712 (H) 850 - 3,900 cells/uL   Absolute Monocytes 2,570 (H) 200 - 950 cells/uL   Eosinophils Absolute 387 15 - 500 cells/uL   Basophils Absolute 158 0 - 200 cells/uL   Neutrophils Relative % 32.8 %   Total Lymphocyte 49.5 %   Monocytes Relative 14.6 %   Eosinophils Relative 2.2 %   Basophils Relative 0.9 %    Smear Review    Hemoglobin A1c  Result Value Ref Range   Hgb A1c MFr Bld 5.8 (H) <5.7 % of total Hgb   Mean  Plasma Glucose 120 (calc)   eAG (mmol/L) 6.6 (calc)   I have personally reviewed the radiology report from 07/03/18.  CLINICAL DATA:  86 year old male with intermittent right knee pain. No recent injury. Symptoms increase with standing.  EXAM: RIGHT KNEE - COMPLETE 4+ VIEW;  BILATERAL KNEES STANDING - 1 VIEW  COMPARISON:  None.  FINDINGS: Standing views of both knees demonstrate moderate to severe bilateral medial compartment knee joint space loss, but worse on the right- approaching a bone-on-bone.  Tricompartmental right knee joint degenerative spurring, also moderate to severe in the patellofemoral compartment. Probable small joint effusion. No acute osseous abnormality identified. Mild calcified peripheral vascular disease.  Incidental small 8 millimeter retained radiopaque foreign body in the subcutaneous fat medial to the left knee.  IMPRESSION: Right greater than left knee joint degeneration, severe in the medial and patellofemoral compartments.  Possible small right knee joint effusion but no acute osseous abnormality identified.   Electronically Signed   By: Genevie Ann M.D.    On: 07/03/2018 14:48     Assessment & Plan:   Problem List Items Addressed This Visit    Primary osteoarthritis of right knee   Relevant Medications   diclofenac Sodium (VOLTAREN) 1 % GEL   Idiopathic chronic gout of right knee without tophus   Chronic pain of right knee - Primary   Relevant Medications   diclofenac Sodium (VOLTAREN) 1 % GEL    Other Visit Diagnoses    Decrease in appetite          Subacute on chronic R generalized Knee pain and swelling without known injury or trauma  Known advanced OA/DJD R knee Complicated history with gout flares and gouty arthritis - Able to bear weight, no knee instability or mechanical locking - No prior history of  knee surgery, arthroscopy - Inadequate conservative therapy  Failed some other meds including NSAIDs. Now on anticoagulation for PAF.  Improved on last steroid injection 05/2019 #1  Plan: 1. Repeat steroid injection today #2 - R knee, see procedure note. Tolerated well, can repeat 3-6 months if need for now 2. Discussed that likely significant structural issue with his advanced arthritis / gout history, may warrant future referral to orthopedics if not improving or responding to injection 3. Order rx Diclofenac topical voltaren gel - use as needed PRN RICE therapy (rest, ice, compression, elevation) for swelling, activity modification   #Decreased appetite Overall reassuring based on history, seems to be episodic and infrequent Weight gain is improved and normal, still maintaining nutrition Advised can use Ensure or Boost if need during day to supplement.  Meds ordered this encounter  Medications  . diclofenac Sodium (VOLTAREN) 1 % GEL    Sig: Apply 2 g topically 4 (four) times daily as needed (right knee arthritis pain).    Dispense:  100 g    Refill:  2  . lidocaine (PF) (XYLOCAINE) 1 % injection 4 mL  . methylPREDNISolone acetate (DEPO-MEDROL) injection 40 mg     Follow up plan: Return in about 3 months (around 02/25/2020) for 3 month R Knee Pain / Arthritis.   Nobie Putnam, [facility] 11/26/2019, 8:37 AM

## 2019-11-26 NOTE — Telephone Encounter (Signed)
[facility] Ebony Hail) with Dr Royce Macadamia has called and states that they are having to release pt. He has had multiple No Shows and rescheduled each multiple times with continued No Shows. They wanted to alert Dr Raliegh Ip that they can no longer continue to treat pt and are very sorry

## 2019-11-27 DIAGNOSIS — N183 Chronic kidney disease, stage 3 unspecified: Secondary | ICD-10-CM | POA: Diagnosis not present

## 2019-11-27 DIAGNOSIS — G4700 Insomnia, unspecified: Secondary | ICD-10-CM | POA: Diagnosis not present

## 2019-11-27 DIAGNOSIS — R531 Weakness: Secondary | ICD-10-CM | POA: Diagnosis not present

## 2019-11-27 DIAGNOSIS — I4891 Unspecified atrial fibrillation: Secondary | ICD-10-CM | POA: Diagnosis not present

## 2019-11-27 DIAGNOSIS — Z87891 Personal history of nicotine dependence: Secondary | ICD-10-CM | POA: Diagnosis not present

## 2019-11-27 DIAGNOSIS — I129 Hypertensive chronic kidney disease with stage 1 through stage 4 chronic kidney disease, or unspecified chronic kidney disease: Secondary | ICD-10-CM | POA: Diagnosis not present

## 2019-11-27 DIAGNOSIS — Z7901 Long term (current) use of anticoagulants: Secondary | ICD-10-CM | POA: Diagnosis not present

## 2019-11-27 DIAGNOSIS — M109 Gout, unspecified: Secondary | ICD-10-CM | POA: Diagnosis not present

## 2019-11-27 DIAGNOSIS — J9691 Respiratory failure, unspecified with hypoxia: Secondary | ICD-10-CM | POA: Diagnosis not present

## 2019-11-28 NOTE — Telephone Encounter (Signed)
Alright. I would recommend a referral to CCM Nurse Case Manager Pam to assist with coordinating this patient's care. Now that they are discharged from [facility] specialist due to no show apts. It looks like they had a [facility] Continuing Care program, I am not sure what was the problem that led to this. Will try to work with Jeannene Patella and figure out a solution. Most likely will try to refer locally to [facility] Oncology.  Nobie Putnam, [facility] 11/28/2019, 4:35 PM

## 2019-12-01 ENCOUNTER — Telehealth: Payer: Self-pay | Admitting: Family Medicine

## 2019-12-01 NOTE — Chronic Care Management (AMB) (Signed)
  Chronic Care Management   Outreach Note  12/01/2019 Name: Robert [name] MRN: [id number] DOB: [birth year]  Robert [name] is a 86 y.o. year old male who is a primary care patient of Robert Hauser, DO. I reached out to [employer]  by phone today in response to a referral sent by Mr. Robert [name]'s PCP, Robert Putnam DO     An unsuccessful telephone outreach was attempted today. The patient was referred to the case management team for assistance with care management and care coordination.   Follow Up Plan: A HIPPA compliant phone message was left for the patient providing contact information and requesting a return call.  The care management team will reach out to the patient again over the next 7 days.  If patient returns call to provider office, please advise to call [employer]edded Care Management Care Guide Robert Durand Robert [name] at [phone]  Robert Lanza, Robert [name] Health Advisor, [place] Management ??[name] ??[phone]

## 2019-12-03 DIAGNOSIS — I129 Hypertensive chronic kidney disease with stage 1 through stage 4 chronic kidney disease, or unspecified chronic kidney disease: Secondary | ICD-10-CM | POA: Diagnosis not present

## 2019-12-03 DIAGNOSIS — R531 Weakness: Secondary | ICD-10-CM | POA: Diagnosis not present

## 2019-12-03 DIAGNOSIS — Z87891 Personal history of nicotine dependence: Secondary | ICD-10-CM | POA: Diagnosis not present

## 2019-12-03 DIAGNOSIS — M109 Gout, unspecified: Secondary | ICD-10-CM | POA: Diagnosis not present

## 2019-12-03 DIAGNOSIS — N183 Chronic kidney disease, stage 3 unspecified: Secondary | ICD-10-CM | POA: Diagnosis not present

## 2019-12-03 DIAGNOSIS — Z7901 Long term (current) use of anticoagulants: Secondary | ICD-10-CM | POA: Diagnosis not present

## 2019-12-03 DIAGNOSIS — G4700 Insomnia, unspecified: Secondary | ICD-10-CM | POA: Diagnosis not present

## 2019-12-03 DIAGNOSIS — J9691 Respiratory failure, unspecified with hypoxia: Secondary | ICD-10-CM | POA: Diagnosis not present

## 2019-12-03 DIAGNOSIS — I4891 Unspecified atrial fibrillation: Secondary | ICD-10-CM | POA: Diagnosis not present

## 2019-12-05 DIAGNOSIS — N183 Chronic kidney disease, stage 3 unspecified: Secondary | ICD-10-CM | POA: Diagnosis not present

## 2019-12-05 DIAGNOSIS — Z7901 Long term (current) use of anticoagulants: Secondary | ICD-10-CM | POA: Diagnosis not present

## 2019-12-05 DIAGNOSIS — I129 Hypertensive chronic kidney disease with stage 1 through stage 4 chronic kidney disease, or unspecified chronic kidney disease: Secondary | ICD-10-CM | POA: Diagnosis not present

## 2019-12-05 DIAGNOSIS — I4891 Unspecified atrial fibrillation: Secondary | ICD-10-CM | POA: Diagnosis not present

## 2019-12-05 DIAGNOSIS — M109 Gout, unspecified: Secondary | ICD-10-CM | POA: Diagnosis not present

## 2019-12-05 DIAGNOSIS — J9691 Respiratory failure, unspecified with hypoxia: Secondary | ICD-10-CM | POA: Diagnosis not present

## 2019-12-05 DIAGNOSIS — G4700 Insomnia, unspecified: Secondary | ICD-10-CM | POA: Diagnosis not present

## 2019-12-05 DIAGNOSIS — Z87891 Personal history of nicotine dependence: Secondary | ICD-10-CM | POA: Diagnosis not present

## 2019-12-05 DIAGNOSIS — R531 Weakness: Secondary | ICD-10-CM | POA: Diagnosis not present

## 2019-12-08 DIAGNOSIS — I4891 Unspecified atrial fibrillation: Secondary | ICD-10-CM | POA: Diagnosis not present

## 2019-12-08 DIAGNOSIS — R600 Localized edema: Secondary | ICD-10-CM | POA: Diagnosis not present

## 2019-12-08 DIAGNOSIS — Z7901 Long term (current) use of anticoagulants: Secondary | ICD-10-CM | POA: Diagnosis not present

## 2019-12-08 DIAGNOSIS — C9110 Chronic lymphocytic leukemia of B-cell type not having achieved remission: Secondary | ICD-10-CM | POA: Diagnosis not present

## 2019-12-08 DIAGNOSIS — D649 Anemia, unspecified: Secondary | ICD-10-CM | POA: Diagnosis not present

## 2019-12-08 DIAGNOSIS — Z66 Do not resuscitate: Secondary | ICD-10-CM | POA: Diagnosis not present

## 2019-12-08 DIAGNOSIS — I129 Hypertensive chronic kidney disease with stage 1 through stage 4 chronic kidney disease, or unspecified chronic kidney disease: Secondary | ICD-10-CM | POA: Diagnosis not present

## 2019-12-08 DIAGNOSIS — E785 Hyperlipidemia, unspecified: Secondary | ICD-10-CM | POA: Diagnosis not present

## 2019-12-08 DIAGNOSIS — E876 Hypokalemia: Secondary | ICD-10-CM | POA: Diagnosis not present

## 2019-12-08 DIAGNOSIS — N189 Chronic kidney disease, unspecified: Secondary | ICD-10-CM | POA: Diagnosis not present

## 2019-12-08 DIAGNOSIS — Z87891 Personal history of nicotine dependence: Secondary | ICD-10-CM | POA: Diagnosis not present

## 2019-12-08 NOTE — Chronic Care Management (AMB) (Signed)
  Chronic Care Management   Outreach Note  12/08/2019 Name: Chapin Brester MRN: [id number] DOB: 19-Apr-1932  Matei Coomer is a 86 y.o. year old male who is a primary care patient of Olin Hauser, DO. I reached out to [employer]  by phone today in response to a referral sent by Mr. Kathryn Brann's PCP, Nobie Putnam DO     A second unsuccessful telephone outreach was attempted today. The patient was referred to the case management team for assistance with care management and care coordination.   Follow Up Plan: A HIPPA compliant phone message was left for the patient providing contact information and requesting a return call.  The care management team will reach out to the patient again over the next 7 days.  If patient returns call to provider office, please advise to call Embedded Care Management Care Guide Glenna Durand LPN at [phone]  Nickeah Allen, LPN Health Advisor, [place] Management ??[email or web address] ??[phone]

## 2019-12-10 DIAGNOSIS — R531 Weakness: Secondary | ICD-10-CM | POA: Diagnosis not present

## 2019-12-10 DIAGNOSIS — N183 Chronic kidney disease, stage 3 unspecified: Secondary | ICD-10-CM | POA: Diagnosis not present

## 2019-12-10 DIAGNOSIS — J9691 Respiratory failure, unspecified with hypoxia: Secondary | ICD-10-CM | POA: Diagnosis not present

## 2019-12-10 DIAGNOSIS — G4700 Insomnia, unspecified: Secondary | ICD-10-CM | POA: Diagnosis not present

## 2019-12-10 DIAGNOSIS — I129 Hypertensive chronic kidney disease with stage 1 through stage 4 chronic kidney disease, or unspecified chronic kidney disease: Secondary | ICD-10-CM | POA: Diagnosis not present

## 2019-12-10 DIAGNOSIS — Z7901 Long term (current) use of anticoagulants: Secondary | ICD-10-CM | POA: Diagnosis not present

## 2019-12-10 DIAGNOSIS — M109 Gout, unspecified: Secondary | ICD-10-CM | POA: Diagnosis not present

## 2019-12-10 DIAGNOSIS — Z87891 Personal history of nicotine dependence: Secondary | ICD-10-CM | POA: Diagnosis not present

## 2019-12-10 DIAGNOSIS — I4891 Unspecified atrial fibrillation: Secondary | ICD-10-CM | POA: Diagnosis not present

## 2019-12-15 DIAGNOSIS — Z87891 Personal history of nicotine dependence: Secondary | ICD-10-CM | POA: Diagnosis not present

## 2019-12-15 DIAGNOSIS — N183 Chronic kidney disease, stage 3 unspecified: Secondary | ICD-10-CM | POA: Diagnosis not present

## 2019-12-15 DIAGNOSIS — M109 Gout, unspecified: Secondary | ICD-10-CM | POA: Diagnosis not present

## 2019-12-15 DIAGNOSIS — I129 Hypertensive chronic kidney disease with stage 1 through stage 4 chronic kidney disease, or unspecified chronic kidney disease: Secondary | ICD-10-CM | POA: Diagnosis not present

## 2019-12-15 DIAGNOSIS — G4700 Insomnia, unspecified: Secondary | ICD-10-CM | POA: Diagnosis not present

## 2019-12-15 DIAGNOSIS — R531 Weakness: Secondary | ICD-10-CM | POA: Diagnosis not present

## 2019-12-15 DIAGNOSIS — Z7901 Long term (current) use of anticoagulants: Secondary | ICD-10-CM | POA: Diagnosis not present

## 2019-12-15 DIAGNOSIS — I4891 Unspecified atrial fibrillation: Secondary | ICD-10-CM | POA: Diagnosis not present

## 2019-12-15 DIAGNOSIS — J9691 Respiratory failure, unspecified with hypoxia: Secondary | ICD-10-CM | POA: Diagnosis not present

## 2019-12-15 NOTE — Chronic Care Management (AMB) (Signed)
  Chronic Care Management   Outreach Note  12/15/2019 Name: Robert [name] MRN: [id number] DOB: 1931/10/07  Robert [name] is a 86 y.o. year old male who is a primary care patient of Olin Hauser, DO. I reached out to [employer]  by phone today in response to a referral sent by Mr. Robert [name]'s PCP, Nobie Putnam DO     Third unsuccessful telephone outreach was attempted today. The patient was referred to the case management team for assistance with care management and care coordination. The patient's primary care provider has been notified of our unsuccessful attempts to make or maintain contact with the patient. The care management team is pleased to engage with this patient at any time in the future should he/she be interested in assistance from the care management team.   Follow Up Plan: The care management team is available to follow up with the patient after provider conversation with the patient regarding recommendation for care management engagement and subsequent re-referral to the care management team.   Glenna Durand, LPN Health Advisor, [place] Management ??[email or web address] ??[phone]

## 2020-01-09 ENCOUNTER — Ambulatory Visit: Payer: Medicare Other | Attending: Internal Medicine

## 2020-01-09 ENCOUNTER — Encounter: Payer: Self-pay | Admitting: Family Medicine

## 2020-01-09 ENCOUNTER — Other Ambulatory Visit: Payer: Self-pay

## 2020-01-09 ENCOUNTER — Ambulatory Visit (INDEPENDENT_AMBULATORY_CARE_PROVIDER_SITE_OTHER): Payer: Medicare Other | Admitting: Family Medicine

## 2020-01-09 VITALS — BP 146/65 | HR 57 | Temp 97.3°F | Resp 16 | Ht 67.0 in | Wt 196.6 lb

## 2020-01-09 DIAGNOSIS — M1711 Unilateral primary osteoarthritis, right knee: Secondary | ICD-10-CM

## 2020-01-09 DIAGNOSIS — M25561 Pain in right knee: Secondary | ICD-10-CM

## 2020-01-09 DIAGNOSIS — Z9989 Dependence on other enabling machines and devices: Secondary | ICD-10-CM | POA: Diagnosis not present

## 2020-01-09 DIAGNOSIS — Z23 Encounter for immunization: Secondary | ICD-10-CM

## 2020-01-09 DIAGNOSIS — G8929 Other chronic pain: Secondary | ICD-10-CM | POA: Diagnosis not present

## 2020-01-09 DIAGNOSIS — G4733 Obstructive sleep apnea (adult) (pediatric): Secondary | ICD-10-CM | POA: Insufficient documentation

## 2020-01-09 MED ORDER — DICLOFENAC SODIUM 1 % EX GEL: 2.0000 g | Freq: Four times a day (QID) | CUTANEOUS | 5 refills | Status: DC | PRN

## 2020-01-09 NOTE — Progress Notes (Signed)
   Covid-19 Vaccination Clinic  Name:  Robert [name]    MRN: [id number] DOB: [birth year]  01/09/2020  [name] was observed post Covid-19 immunization for 15 minutes without incident. He was provided with Vaccine Information Sheet and instruction to access the V-Safe system.   [name] was instructed to call 911 with any severe reactions post vaccine: Marland Kitchen Difficulty breathing  . Swelling of face and throat  . A fast heartbeat  . A bad rash all over body  . Dizziness and weakness   Immunizations Administered    Name Date Dose VIS Date Route   Pfizer COVID-19 Vaccine 01/09/2020 10:03 AM 0.3 mL 10/01/2018 Intramuscular   Manufacturer: [place]   Lot: [id number]   [place]: [address]-8
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
## 2020-01-26 ENCOUNTER — Telehealth: Payer: Self-pay | Admitting: Family Medicine

## 2020-01-26 DIAGNOSIS — N183 Chronic kidney disease, stage 3 unspecified: Secondary | ICD-10-CM

## 2020-01-26 DIAGNOSIS — I129 Hypertensive chronic kidney disease with stage 1 through stage 4 chronic kidney disease, or unspecified chronic kidney disease: Secondary | ICD-10-CM

## 2020-01-26 MED ORDER — ATENOLOL 50 MG PO TABS: 25.0000 mg | ORAL_TABLET | Freq: Every day | ORAL | 1 refills | Status: DC

## 2020-01-26 NOTE — Telephone Encounter (Signed)
Per initial request," Patient called to ask the nurse or doctor to call regarding getting some oxygen instead of using the cpap machine.  Patient stated that he does not want to use the cpap and prefers to use oxygen to sleep.  Please advise and call patient to discuss.  CB# [phone]"; will route to office for final disposition

## 2020-01-26 NOTE — Telephone Encounter (Signed)
Requested Prescriptions  Pending Prescriptions Disp Refills  . atenolol (TENORMIN) 50 MG tablet 45 tablet 1    Sig: Take 0.5 tablets (25 mg total) by mouth daily.     There is no refill protocol information for this order

## 2020-01-26 NOTE — Telephone Encounter (Signed)
Medication Refill - Medication: atenolol (TENORMIN) 50 MG tablet  Patient stated he is all out of medication   Preferred Pharmacy (with phone number or street name):  [place], [place] Phone:  [phone]  Fax:  [phone]       Agent: Please be advised that RX refills may take up to 3 business days. We ask that you follow-up with your pharmacy.

## 2020-01-26 NOTE — Telephone Encounter (Signed)
Spoke to the grand daughter and informed her to find out from the insurance about the coverage for oxygen therapy and he might also need apt.

## 2020-01-26 NOTE — Telephone Encounter (Signed)
He is followed by [facility]  He was diagnosed with Severe sleep apnea. I do not think oxygen only is a good idea.  If he is having problems with the CPAP machine he likely needs to re-discuss this with the sleep specialist at [facility].  In order to qualify for overnight oxygen he would need a separate Overnight Oximetry test we can be ordered, but usually this is more for patients with lung disease or COPD as well.  If the problem is sleep apnea - he will keep having a problem unless he uses the CPAP machine.  Nobie Putnam, [facility] 01/26/2020, 12:35 PM

## 2020-01-26 NOTE — Telephone Encounter (Signed)
Patient called to ask the nurse or doctor to call regarding getting some oxygen instead of using the cpap machine.  Patient stated that he does not want to use the cpap and prefers to use oxygen to sleep.  Please advise and call patient to discuss.  CB# [phone]

## 2020-01-27 NOTE — Telephone Encounter (Signed)
Patient's granddaughter Robert [name] called back and she was given the message below by Dr. Parks Ranger. She says her grandfather will not wear the CPAP, he says he doesn't like it on his face and it blows too much. She says if he could have the Overnight Oximetry test ordered, they will go that route.

## 2020-01-27 NOTE — Telephone Encounter (Addendum)
Unable to reach the patient Robert [name] please let patient know about Dr Marthann Schiller recommendation.

## 2020-01-27 NOTE — Telephone Encounter (Signed)
Patient called and his granddaughter answered the phone. As I was attempting to give her the information noted below by Dr. Parks Ranger, she received another call and says she will call back.

## 2020-02-04 ENCOUNTER — Ambulatory Visit: Payer: Medicare Other | Admitting: Family Medicine

## 2020-02-05 ENCOUNTER — Ambulatory Visit: Payer: Medicare Other | Attending: Internal Medicine

## 2020-02-05 DIAGNOSIS — Z23 Encounter for immunization: Secondary | ICD-10-CM

## 2020-02-05 NOTE — Progress Notes (Signed)
   Covid-19 Vaccination Clinic  Name:  Jessejames Steelman    MRN: [id number] DOB: 24-Dec-1931  02/05/2020  Mr. Savarino was observed post Covid-19 immunization for 15 minutes without incident. He was provided with Vaccine Information Sheet and instruction to access the V-Safe system.   Mr. Russett was instructed to call 911 with any severe reactions post vaccine: Marland Kitchen Difficulty breathing  . Swelling of face and throat  . A fast heartbeat  . A bad rash all over body  . Dizziness and weakness   Immunizations Administered    Name Date Dose VIS Date Route   Pfizer COVID-19 Vaccine 02/05/2020  8:46 AM 0.3 mL 10/01/2018 Intramuscular   Manufacturer: [place]   Lot: [id number]   [place]: [id number]

## 2020-03-02 ENCOUNTER — Other Ambulatory Visit: Payer: Self-pay | Admitting: Family Medicine

## 2020-03-02 DIAGNOSIS — I129 Hypertensive chronic kidney disease with stage 1 through stage 4 chronic kidney disease, or unspecified chronic kidney disease: Secondary | ICD-10-CM

## 2020-03-02 DIAGNOSIS — N183 Chronic kidney disease, stage 3 unspecified: Secondary | ICD-10-CM

## 2020-03-02 NOTE — Telephone Encounter (Signed)
Requested medication (s) are due for refill today: Yes  Requested medication (s) are on the active medication list: Yes  Last refill:  11/26/19  Future visit scheduled: No  Notes to clinic:  Historical provider.    Requested Prescriptions  Pending Prescriptions Disp Refills   lisinopril (ZESTRIL) 40 MG tablet [Pharmacy Med Name: LISINOPRIL 40 MG TABLET] 90 tablet 0    Sig: Take 1 tablet (40 mg total) by mouth daily.      Cardiovascular:  ACE Inhibitors Failed - 03/02/2020 10:33 AM      Failed - Cr in normal range and within 180 days    Creat  Date Value Ref Range Status  10/29/2019 1.54 (H) 0.70 - 1.11 mg/dL Final    Comment:    For patients >53 years of age, the reference limit for Creatinine is approximately 13% higher for people identified as African-American. .           Failed - Last BP in normal range    BP Readings from Last 1 Encounters:  01/09/20 (!) 146/65          Passed - K in normal range and within 180 days    Potassium  Date Value Ref Range Status  10/29/2019 3.6 3.5 - 5.3 mmol/L Final          Passed - Patient is not pregnant      Passed - Valid encounter within last 6 months    Recent Outpatient Visits           1 month ago Primary osteoarthritis of right knee   [place], DO   3 months ago Chronic pain of right knee   [place], DO   4 months ago Benign hypertension with CKD (chronic kidney disease) stage III   [facility] Olin Hauser, DO   9 months ago Chronic pain of right knee   [facility] Olin Hauser, DO   9 months ago Chronic pain of right knee   [place], Devonne Dought[place], [place]

## 2020-03-09 DIAGNOSIS — C9110 Chronic lymphocytic leukemia of B-cell type not having achieved remission: Secondary | ICD-10-CM | POA: Diagnosis not present

## 2020-03-23 ENCOUNTER — Encounter: Payer: Self-pay | Admitting: Family Medicine

## 2020-03-23 ENCOUNTER — Other Ambulatory Visit: Payer: Self-pay

## 2020-03-23 ENCOUNTER — Ambulatory Visit (INDEPENDENT_AMBULATORY_CARE_PROVIDER_SITE_OTHER): Payer: Medicare Other | Admitting: Family Medicine

## 2020-03-23 VITALS — BP 154/69 | HR 77 | Temp 97.1°F | Resp 16 | Ht 67.0 in | Wt 180.0 lb

## 2020-03-23 DIAGNOSIS — N1831 Chronic kidney disease, stage 3a: Secondary | ICD-10-CM | POA: Diagnosis not present

## 2020-03-23 DIAGNOSIS — R7303 Prediabetes: Secondary | ICD-10-CM | POA: Diagnosis not present

## 2020-03-23 DIAGNOSIS — M1A9XX0 Chronic gout, unspecified, without tophus (tophi): Secondary | ICD-10-CM

## 2020-03-23 DIAGNOSIS — G4733 Obstructive sleep apnea (adult) (pediatric): Secondary | ICD-10-CM

## 2020-03-23 DIAGNOSIS — I4821 Permanent atrial fibrillation: Secondary | ICD-10-CM | POA: Diagnosis not present

## 2020-03-23 DIAGNOSIS — N183 Chronic kidney disease, stage 3 unspecified: Secondary | ICD-10-CM | POA: Diagnosis not present

## 2020-03-23 DIAGNOSIS — I129 Hypertensive chronic kidney disease with stage 1 through stage 4 chronic kidney disease, or unspecified chronic kidney disease: Secondary | ICD-10-CM | POA: Diagnosis not present

## 2020-03-23 MED ORDER — ATENOLOL 50 MG PO TABS: 50.0000 mg | ORAL_TABLET | Freq: Every day | ORAL | 1 refills | Status: DC

## 2020-03-23 MED ORDER — ALLOPURINOL 100 MG PO TABS: 100.0000 mg | ORAL_TABLET | Freq: Every day | ORAL | 1 refills | Status: DC

## 2020-03-23 NOTE — Assessment & Plan Note (Signed)
Stable chronic problem now On Atenolol for rate control On anticoagulation Eliquis Will resume this at 50mg  dose daily on 8/21 when eligible to pick up new order from pharmacy

## 2020-03-23 NOTE — Assessment & Plan Note (Signed)
See HTN A&P Complication with CKDIII Avoid oral nsaid, use topical Control HTN Avoid ACEi for now

## 2020-03-23 NOTE — Assessment & Plan Note (Addendum)
Mild elevated BP off medication - Home BP readings reportedly normal in past, but not checking lately Complicated by CKD-III, AFib OFF Amlodipine 5, Lisinopril 40   Plan:  1. RESTART Atenolol 50mg  daily (switch to full dose one pill daily) new rx sent to [facility], start 8/21 when eligible. - HOLD Lisinopril 40 for now - likely will DISCONTINUE this completely due to prior AKI - We will check lab with Chemistry Creatinine, and in future if we need more BP control, we can consider low dose Losartan to help BP and reduce gout risk.  Continue Furosemide 20-40mg  daily PRN for edema  2. Encourage improved lifestyle - low sodium diet, regular exercise as tolerated 3. Continue monitor BP outside office, bring readings to next visit, if persistently >140/90 or new symptoms notify office sooner

## 2020-03-23 NOTE — Assessment & Plan Note (Signed)
Stable controlled without flare Likely chronic gouty arthritis contributing to chronic knee issues Last uric acid 5.1 (02/2018) back on Allopurinol 300mg   Now will RESTART Allopurinol 100mg  daily, he had been taken off in hospital due to AKI. Avoid food triggers Counseling may cause mild flare but hopefully will resolve, goal to avoid temporary meds that can affect his kidney function Check uric acid today, may repeat in 3-6 months

## 2020-03-23 NOTE — Assessment & Plan Note (Signed)
Unable to tolerate CPAP mask Followed by [facility]

## 2020-03-23 NOTE — Progress Notes (Signed)
Subjective:    Patient ID: Robert [name], male    DOB: [birth year], 86 y.o.   MRN: [id number]  Robert [name] is a 86 y.o. male presenting on 03/23/2020 for Knee Pain (Right side) and Tremors (left side onset 3 month noticing getting worst)   HPI   CHRONIC HTN / CKD -III Reports prior history since 10/2019 he had dehydration in hospital and AKI, he was taken off Lisinopril in hospital (was on 40mg ) switched to lasix diuretic and changed to Atenolol for atrial fib and BP BP up to 154 today, no other home readings Last lab Creatinine 1.54, elevated in 10/2019, due for lab repeat Current Meds - now there was concern with possible mixup on dosing, he was taking WHOLE pill Atenolol 50mg  instead of half pill for 25mg , now ran out early, cannot get fill from pharmacy, he was OFF Lisinopril 40 but it was filled recently they have not restarted it   Denies CP, dyspnea, HA, edema, dizziness / lightheadedness  PreDM Last lab A1c 5.8 in 10/2019 Meds: none Was on ACEi Denies hypoglycemia, polyuria, visual changes, numbness or tingling.  Gout Last result- Uric acid 12.7 (10/2019). History of chronic recurrent gout flare and knee pain He was on Allopurinol in past, from 100 up to 300mg  in past, with good results Uric Acid down to 5 but he was taken off of it during hospital in earlier 2021.  Follow-up Knee PainRIGHT / Osteoarthritis/Bilateral Gout Arthritis See above Gout He is using topical Voltaren, and knee wrap - will look for knee brace now Prior steroid injections in past - He has not seen Orthopedic. Chronic knee problem >10+ years Denies injury recent fall or trauma, redness, swelling, numb tingling  Severe OSA Followed by [facility] Had difficulty with wearing CPAP mask  Additional Prior history of tremors - Left hand in past, had resolved, now has had some recurrence with episodic left hand shaking at times, can be when not active or resting or can be if using arm. He has good  grip and hand strength, he still has some weakness with holding objects but not dropping anything and not affecting his function. He has mild numbness in his fingers on L side compared to R   Depression screen [facility] 2/9 10/29/2019 06/04/2019 06/03/2019  Decreased Interest 0 0 0  Down, Depressed, Hopeless 0 0 0  PHQ - 2 Score 0 0 0    Social History   Tobacco Use  . Smoking status: Former Smoker    Quit date: 1985    Years since quitting: 36.6  . Smokeless tobacco: Former Network engineer  . Vaping Use: Never used  Substance Use Topics  . Alcohol use: Never  . Drug use: Never    Review of Systems Per HPI unless specifically indicated above     Objective:    BP (!) 154/69   Pulse 77   Temp (!) 97.1 F (36.2 C) (Temporal)   Resp 16   Ht 5\' 7"  (1.702 m)   Wt 180 lb (81.6 kg)   SpO2 100%   BMI 28.19 kg/m   Wt Readings from Last 3 Encounters:  03/23/20 180 lb (81.6 kg)  01/09/20 196 lb 9.6 oz (89.2 kg)  11/26/19 202 lb (91.6 kg)    Physical Exam Vitals and nursing note reviewed.  Constitutional:      General: He is not in acute distress.    Appearance: He is well-developed. He is not diaphoretic.  Comments: Well-appearing, comfortable, cooperative  HENT:     Head: Normocephalic and atraumatic.  Eyes:     General:        Right eye: No discharge.        Left eye: No discharge.     Conjunctiva/sclera: Conjunctivae normal.  Neck:     Thyroid: No thyromegaly.  Cardiovascular:     Rate and Rhythm: Normal rate and regular rhythm.     Heart sounds: Normal heart sounds. No murmur heard.   Pulmonary:     Effort: Pulmonary effort is normal. No respiratory distress.     Breath sounds: Normal breath sounds. No wheezing or rales.  Musculoskeletal:     Cervical back: Normal range of motion and neck supple.     Comments: Bilateral hands bulky large CMC joint thumb with some deformity L >R, also bulky L index MCP joint with reduced range of motion, some weakness with grip  thumb opposition, no obvious tremors at rest, some mild reduced sensation to light touch left hand but compared to R it is fairly normal  Right Knee with bulky appearance, has crepitus and some joint line pain on range of motion.  Lymphadenopathy:     Cervical: No cervical adenopathy.  Skin:    General: Skin is warm and dry.     Findings: No erythema or rash.  Neurological:     Mental Status: He is alert and oriented to person, place, and time.  Psychiatric:        Behavior: Behavior normal.     Comments: Well groomed, good eye contact, normal speech and thoughts       Results for orders placed or performed in visit on 10/29/19  Uric acid  Result Value Ref Range   Uric Acid, Serum 12.7 (H) 4.0 - 8.0 mg/dL  COMPLETE METABOLIC PANEL WITH GFR  Result Value Ref Range   Glucose, Bld 94 65 - 99 mg/dL   BUN 21 7 - 25 mg/dL   Creat 1.54 (H) 0.70 - 1.11 mg/dL   GFR, Est Non African American 40 (L) > OR = 60 mL/min/1.20m2   GFR, Est African American 47 (L) > OR = 60 mL/min/1.78m2   BUN/Creatinine Ratio 14 6 - 22 (calc)   Sodium 138 135 - 146 mmol/L   Potassium 3.6 3.5 - 5.3 mmol/L   Chloride 103 98 - 110 mmol/L   CO2 30 20 - 32 mmol/L   Calcium 9.0 8.6 - 10.3 mg/dL   Total Protein 6.4 6.1 - 8.1 g/dL   Albumin 4.0 3.6 - 5.1 g/dL   Globulin 2.4 1.9 - 3.7 g/dL (calc)   AG Ratio 1.7 1.0 - 2.5 (calc)   Total Bilirubin 0.8 0.2 - 1.2 mg/dL   Alkaline phosphatase (APISO) 65 35 - 144 U/L   AST 24 10 - 35 U/L   ALT 13 9 - 46 U/L  CBC with Differential/Platelet  Result Value Ref Range   WBC 17.6 (H) 3.8 - 10.8 Thousand/uL   RBC 5.22 4.20 - 5.80 Million/uL   Hemoglobin 12.1 (L) 13.2 - 17.1 g/dL   HCT 38.8 38 - 50 %   MCV 74.3 (L) 80.0 - 100.0 fL   MCH 23.2 (L) 27.0 - 33.0 pg   MCHC 31.2 (L) 32.0 - 36.0 g/dL   RDW 17.3 (H) 11.0 - 15.0 %   Platelets 248 140 - 400 Thousand/uL   MPV 10.9 7.5 - 12.5 fL   Neutro Abs 5,773 1,500 - 7,800 cells/uL  Lymphs Abs 8,712 (H) 850 - 3,900 cells/uL    Absolute Monocytes 2,570 (H) 200 - 950 cells/uL   Eosinophils Absolute 387 15 - 500 cells/uL   Basophils Absolute 158 0 - 200 cells/uL   Neutrophils Relative % 32.8 %   Total Lymphocyte 49.5 %   Monocytes Relative 14.6 %   Eosinophils Relative 2.2 %   Basophils Relative 0.9 %   Smear Review    Hemoglobin A1c  Result Value Ref Range   Hgb A1c MFr Bld 5.8 (H) <5.7 % of total Hgb   Mean Plasma Glucose 120 (calc)   eAG (mmol/L) 6.6 (calc)      Assessment & Plan:   Problem List Items Addressed This Visit    Pre-diabetes    Mild elevated A1c 5.8 on last lab Concern with obesity, HTN, HLD  Plan:  Check A1c today, lab  1. Not on any therapy currently  2. Encourage improved lifestyle - low carb, low sugar diet, reduce portion size, continue improving regular exercise        Relevant Orders   Hemoglobin A1c   OSA (obstructive sleep apnea)    Unable to tolerate CPAP mask Followed by [facility]      Gout    Stable controlled without flare Likely chronic gouty arthritis contributing to chronic knee issues Last uric acid 5.1 (02/2018) back on Allopurinol 300mg   Now will RESTART Allopurinol 100mg  daily, he had been taken off in hospital due to AKI. Avoid food triggers Counseling may cause mild flare but hopefully will resolve, goal to avoid temporary meds that can affect his kidney function Check uric acid today, may repeat in 3-6 months      Relevant Medications   allopurinol (ZYLOPRIM) 100 MG tablet   Other Relevant Orders   Uric acid   CKD (chronic kidney disease), stage III    See HTN A&P Complication with CKDIII Avoid oral nsaid, use topical Control HTN Avoid ACEi for now      Relevant Orders   COMPLETE METABOLIC PANEL WITH GFR   Benign hypertension with CKD (chronic kidney disease) stage III - Primary    Mild elevated BP off medication - Home BP readings reportedly normal in past, but not checking lately Complicated by CKD-III, AFib OFF Amlodipine 5,  Lisinopril 40   Plan:  1. RESTART Atenolol 50mg  daily (switch to full dose one pill daily) new rx sent to [facility], start 8/21 when eligible. - HOLD Lisinopril 40 for now - likely will DISCONTINUE this completely due to prior AKI - We will check lab with Chemistry Creatinine, and in future if we need more BP control, we can consider low dose Losartan to help BP and reduce gout risk.  Continue Furosemide 20-40mg  daily PRN for edema  2. Encourage improved lifestyle - low sodium diet, regular exercise as tolerated 3. Continue monitor BP outside office, bring readings to next visit, if persistently >140/90 or new symptoms notify office sooner      Relevant Medications   atenolol (TENORMIN) 50 MG tablet   Other Relevant Orders   COMPLETE METABOLIC PANEL WITH GFR   Atrial fibrillation (HCC)    Stable chronic problem now On Atenolol for rate control On anticoagulation Eliquis Will resume this at 50mg  dose daily on 8/21 when eligible to pick up new order from pharmacy      Relevant Medications   atenolol (TENORMIN) 50 MG tablet      Meds ordered this encounter  Medications  . atenolol (TENORMIN)  50 MG tablet    Sig: Take 1 tablet (50 mg total) by mouth daily.    Dispense:  90 tablet    Refill:  1    Changed dose, now take whole pill 50mg  daily, new order sent, please discard old orders on file, likely first fill date 8/21 as reported  . allopurinol (ZYLOPRIM) 100 MG tablet    Sig: Take 1 tablet (100 mg total) by mouth daily.    Dispense:  90 tablet    Refill:  1      Follow up plan: Return in about 3 months (around 06/23/2020) for 3 month follow-up HTN, Gout.   Nobie Putnam, [facility] 03/23/2020, 9:46 AM

## 2020-03-23 NOTE — Patient Instructions (Addendum)
Thank you for coming to the office today.  Please schedule and return for a NURSE ONLY VISIT for VACCINE - Approximately around September October 2021 - Need High Dose Flu Vaccine  Labs today - checking Kidney, Creatinine, Uric Acid (gout), A1c sugar.  REMAIN OFF Lisinopril 40mg . - please discontinue this from [facility]. - This can affect kidneys, and may not need it anymore for BP.  We will RE ORDER - Atenolol 50mg  - now take ONE WHOLE pill daily, pick up on 8/21, this should control BP and heart rate.  RESTART Allopurinol 100mg  daily for preventing gout flare, it may trigger some minor gout pains when you first start, but hopefully this will resolve promptly. - Can pick up on 8/21  In future, we may add Losartan low dose for BP if needed, can help gout.  Please schedule a Follow-up Appointment to: Return in about 3 months (around 06/23/2020) for 3 month follow-up HTN, Gout.  If you have any other questions or concerns, please feel free to call the office or send a message through [place]. You may also schedule an earlier appointment if necessary.  Additionally, you may be receiving a survey about your experience at our office within a few days to 1 week by e-mail or mail. We value your feedback.  Nobie Putnam, DO [place]

## 2020-03-23 NOTE — Assessment & Plan Note (Signed)
Mild elevated A1c 5.8 on last lab Concern with obesity, HTN, HLD  Plan:  Check A1c today, lab  1. Not on any therapy currently  2. Encourage improved lifestyle - low carb, low sugar diet, reduce portion size, continue improving regular exercise

## 2020-03-24 LAB — COMPLETE METABOLIC PANEL WITH GFR
AG Ratio: 1.9 (calc) (ref 1.0–2.5)
ALT: 8 U/L — ABNORMAL LOW (ref 9–46)
AST: 17 U/L (ref 10–35)
Albumin: 4.6 g/dL (ref 3.6–5.1)
Alkaline phosphatase (APISO): 78 U/L (ref 35–144)
BUN/Creatinine Ratio: 12 (calc) (ref 6–22)
BUN: 18 mg/dL (ref 7–25)
CO2: 29 mmol/L (ref 20–32)
Calcium: 9.6 mg/dL (ref 8.6–10.3)
Chloride: 103 mmol/L (ref 98–110)
Creat: 1.49 mg/dL — ABNORMAL HIGH (ref 0.70–1.11)
GFR, Est African American: 49 mL/min/{1.73_m2} — ABNORMAL LOW (ref >=60)
GFR, Est Non African American: 42 mL/min/{1.73_m2} — ABNORMAL LOW (ref >=60)
Globulin: 2.4 g/dL (calc) (ref 1.9–3.7)
Glucose, Bld: 142 mg/dL — ABNORMAL HIGH (ref 65–99)
Potassium: 3.9 mmol/L (ref 3.5–5.3)
Sodium: 143 mmol/L (ref 135–146)
Total Bilirubin: 1.5 mg/dL — ABNORMAL HIGH (ref 0.2–1.2)
Total Protein: 7 g/dL (ref 6.1–8.1)

## 2020-03-24 LAB — HEMOGLOBIN A1C
Hgb A1c MFr Bld: 5.3 % of total Hgb (ref <5.7)
Mean Plasma Glucose: 105 (calc)
eAG (mmol/L): 5.8 (calc)

## 2020-03-24 LAB — URIC ACID: Uric Acid, Serum: 9.8 mg/dL — ABNORMAL HIGH (ref 4.0–8.0)

## 2020-04-01 ENCOUNTER — Other Ambulatory Visit: Payer: Self-pay | Admitting: Family Medicine

## 2020-04-01 DIAGNOSIS — I4821 Permanent atrial fibrillation: Secondary | ICD-10-CM

## 2020-04-01 NOTE — Telephone Encounter (Signed)
Requested Prescriptions  Pending Prescriptions Disp Refills  . ELIQUIS 2.5 MG TABS tablet [Pharmacy Med Name: ELIQUIS 2.5 MG TABLET] 60 tablet 2    Sig: Take 1 tablet (2.5 mg total) by mouth 2 (two) times daily.     Hematology:  Anticoagulants Failed - 04/01/2020 10:17 AM      Failed - HGB in normal range and within 360 days    Hemoglobin  Date Value Ref Range Status  10/29/2019 12.1 (L) 13.2 - 17.1 g/dL Final         Failed - Cr in normal range and within 360 days    Creat  Date Value Ref Range Status  03/23/2020 1.49 (H) 0.70 - 1.11 mg/dL Final    Comment:    For patients >37 years of age, the reference limit for Creatinine is approximately 13% higher for people identified as African-American. .          Passed - PLT in normal range and within 360 days    Platelets  Date Value Ref Range Status  10/29/2019 248 140 - 400 Thousand/uL Final         Passed - HCT in normal range and within 360 days    HCT  Date Value Ref Range Status  10/29/2019 38.8 38 - 50 % Final         Passed - Valid encounter within last 12 months    Recent Outpatient Visits          1 week ago Benign hypertension with CKD (chronic kidney disease) stage III   [place], DO   2 months ago Primary osteoarthritis of right knee   [place], DO   4 months ago Chronic pain of right knee   [place], DO   5 months ago Benign hypertension with CKD (chronic kidney disease) stage III   [place], DO   10 months ago Chronic pain of right knee   [facility] [place], Devonne Doughty, DO      Future Appointments            In 2 months Parks Ranger, Devonne Doughty, DO [facility], [facility]

## 2020-04-26 ENCOUNTER — Other Ambulatory Visit: Payer: Self-pay

## 2020-04-26 ENCOUNTER — Ambulatory Visit (INDEPENDENT_AMBULATORY_CARE_PROVIDER_SITE_OTHER): Payer: Medicare Other

## 2020-04-26 DIAGNOSIS — Z23 Encounter for immunization: Secondary | ICD-10-CM

## 2020-06-08 ENCOUNTER — Ambulatory Visit: Payer: Medicare Other

## 2020-06-09 ENCOUNTER — Telehealth: Payer: Self-pay | Admitting: Family Medicine

## 2020-06-09 ENCOUNTER — Telehealth: Payer: Self-pay | Admitting: *Deleted

## 2020-06-09 DIAGNOSIS — N183 Chronic kidney disease, stage 3 unspecified: Secondary | ICD-10-CM

## 2020-06-09 DIAGNOSIS — I129 Hypertensive chronic kidney disease with stage 1 through stage 4 chronic kidney disease, or unspecified chronic kidney disease: Secondary | ICD-10-CM

## 2020-06-09 DIAGNOSIS — I872 Venous insufficiency (chronic) (peripheral): Secondary | ICD-10-CM

## 2020-06-09 NOTE — Telephone Encounter (Signed)
Patient with question regarding current flu vaccine.

## 2020-06-09 NOTE — Telephone Encounter (Signed)
Requested Prescriptions  Pending Prescriptions Disp Refills  . furosemide (LASIX) 40 MG tablet [Pharmacy Med Name: FUROSEMIDE 40 MG TABLET] 30 tablet 0    Sig: Take 0.5-1 tablets (20-40 mg total) by mouth daily as needed for fluid or edema.     Cardiovascular:  Diuretics - Loop Failed - 06/09/2020 10:55 AM      Failed - Cr in normal range and within 360 days    Creat  Date Value Ref Range Status  03/23/2020 1.49 (H) 0.70 - 1.11 mg/dL Final    Comment:    For patients >4 years of age, the reference limit for Creatinine is approximately 13% higher for people identified as African-American. .          Failed - Last BP in normal range    BP Readings from Last 1 Encounters:  03/23/20 (!) 154/69         Passed - K in normal range and within 360 days    Potassium  Date Value Ref Range Status  03/23/2020 3.9 3.5 - 5.3 mmol/L Final         Passed - Ca in normal range and within 360 days    Calcium  Date Value Ref Range Status  03/23/2020 9.6 8.6 - 10.3 mg/dL Final         Passed - Na in normal range and within 360 days    Sodium  Date Value Ref Range Status  03/23/2020 143 135 - 146 mmol/L Final         Passed - Valid encounter within last 6 months    Recent Outpatient Visits          2 months ago Benign hypertension with CKD (chronic kidney disease) stage III   [place], Devonne Doughty, DO   5 months ago Primary osteoarthritis of right knee   [place], DO   6 months ago Chronic pain of right knee   [place], DO   7 months ago Benign hypertension with CKD (chronic kidney disease) stage III   [place], DO   1 year ago Chronic pain of right knee   [facility] [place], Devonne Doughty, DO      Future Appointments            In 2 weeks Parks Ranger, Devonne Doughty, DO [facility], [facility]

## 2020-06-09 NOTE — Telephone Encounter (Signed)
Patient grand daughter is currently at the pharamcy and would like to know if furosemide (LASIX) 40 MG tablet can be sent in now, stating patient lives far away and it's hard to get to the pharmacy . Would like a follow up call if completed.

## 2020-06-23 ENCOUNTER — Ambulatory Visit: Payer: Medicare Other | Admitting: Family Medicine

## 2020-06-30 ENCOUNTER — Encounter: Payer: Self-pay | Admitting: Family Medicine

## 2020-06-30 ENCOUNTER — Other Ambulatory Visit: Payer: Self-pay

## 2020-06-30 ENCOUNTER — Ambulatory Visit (INDEPENDENT_AMBULATORY_CARE_PROVIDER_SITE_OTHER): Payer: Medicare Other | Admitting: Family Medicine

## 2020-06-30 VITALS — BP 158/86 | HR 62 | Temp 97.1°F | Resp 16 | Ht 67.0 in | Wt 190.6 lb

## 2020-06-30 DIAGNOSIS — N183 Chronic kidney disease, stage 3 unspecified: Secondary | ICD-10-CM

## 2020-06-30 DIAGNOSIS — I129 Hypertensive chronic kidney disease with stage 1 through stage 4 chronic kidney disease, or unspecified chronic kidney disease: Secondary | ICD-10-CM

## 2020-06-30 DIAGNOSIS — C9110 Chronic lymphocytic leukemia of B-cell type not having achieved remission: Secondary | ICD-10-CM | POA: Diagnosis not present

## 2020-06-30 DIAGNOSIS — M8949 Other hypertrophic osteoarthropathy, multiple sites: Secondary | ICD-10-CM

## 2020-06-30 DIAGNOSIS — N1831 Chronic kidney disease, stage 3a: Secondary | ICD-10-CM

## 2020-06-30 DIAGNOSIS — G4733 Obstructive sleep apnea (adult) (pediatric): Secondary | ICD-10-CM | POA: Diagnosis not present

## 2020-06-30 DIAGNOSIS — M159 Polyosteoarthritis, unspecified: Secondary | ICD-10-CM

## 2020-06-30 MED ORDER — HYDROCHLOROTHIAZIDE 25 MG PO TABS: 25.0000 mg | ORAL_TABLET | Freq: Every day | ORAL | 2 refills | Status: DC

## 2020-06-30 NOTE — Assessment & Plan Note (Signed)
Surveillance, stage 0 Followed by [facility] Heme/Onc Not indicated for treatment at this time

## 2020-06-30 NOTE — Assessment & Plan Note (Addendum)
Persistent chronic R > L generalized Knee pain, with known advanced osteoarthritis DJD With some inc swelling No new injury or problem No prior history of knee surgery, arthroscopy Partial improvement on med management Last X-ray 06/2018 reviewed  Plan: Continue TOPICAL NSAID only  Continue Tylenol high dose 1g TID PRN RICE therapy (rest, ice, compression, elevation) for swelling, activity modification  If injection ineffective or only short duration will consider referral to Orthopedics

## 2020-06-30 NOTE — Patient Instructions (Addendum)
Thank you for coming to the office today.  New BP medication Fluid pill / BP pill - Hydrochlorothiazide (HCTZ) 25mg  take one every day  - you can still take furosemide (lasix) fluid pill ONLY AS NEEDED - skip it if not much swelling in morning, hopefully use this one LESS Often maybe 1-2 times a week or so.  Keep on Atenolol no change.  DUE for NON FASTING BLOOD WORK   SCHEDULE "Lab Only" visit in the morning at the clinic for lab draw in [place]   - Make sure Lab Only appointment is at about 1 week before your next appointment, so that results will be available  For Lab Results, once available within 2-3 days of blood draw, you can can log in to MyChart online to view your results and a brief explanation. Also, we can discuss results at next follow-up visit.   Please schedule a Follow-up Appointment to: Return in about 6 weeks (around 08/11/2020) for 6 week non fasting lab only then 1-2 weeks later HTN, CKD lab.  If you have any other questions or concerns, please feel free to call the office or send a message through [place]. You may also schedule an earlier appointment if necessary.  Additionally, you may be receiving a survey about your experience at our office within a few days to 1 week by e-mail or mail. We value your feedback.  Nobie Putnam, DO [place]

## 2020-06-30 NOTE — Progress Notes (Signed)
Subjective:    Patient ID: Robert [name], male    DOB: Dec 07, 1931, 86 y.o.   MRN: [id number]  Robert [name] is a 86 y.o. male presenting on 06/30/2020 for Hypertension   HPI   CHRONIC HTN / CKD -III Reports prior history since 10/2019 he had dehydration in hospital and AKI, he was taken off Lisinopril in hospital (was on 40mg ) switched to lasix diuretic and changed to Atenolol for atrial fib and BP - Also had swelling on Amlodipine in past even 5mg  now off of this Home reading BP infrequent. Today elevated Last lab Creatinine 1.49 (last result 03/2020) Current Meds - Atenolol 50mg  daily, Furosemide 40mg  (every other day) Swelling is well controlled Denies CP, dyspnea, HA, edema, dizziness / lightheadedness  Gout Last result- Uric acid 9.8 (10/2019) - improved from prior result. History of chronic recurrent gout flare and knee pain No flares since last visit. He continues Allopurinol 100mg  daily for preventing. Previously on 300mg .  Follow-up Knee PainRIGHT / Osteoarthritis/Bilateral Gout Arthritis See above Gout He is using topical Voltaren, and knee wrap - will look for knee brace now Prior steroid injections in past - He has notseen Orthopedic. Chronic knee problem >10+ years Denies injury recent fall or trauma, redness, swelling, numb tingling  Severe OSA Followed by [facility] Sleep [facility] Unable to use CPAP mask due to difficulty but some improvement able to sleep through night  Chronic Lymphocytic Leukemia Followed by [facility] Heme/Onc Last 03/2020, surveillance now, he is stage 0, no treatment at this time.  Left ear fullness pressure can't hear as well  Health Maintenance: UTD Vaccines   Depression screen [facility] 2/9 10/29/2019 06/04/2019 06/03/2019  Decreased Interest 0 0 0  Down, Depressed, Hopeless 0 0 0  PHQ - 2 Score 0 0 0    Social History   Tobacco Use  . Smoking status: Former Smoker    Quit date: 1985    Years since quitting: 36.9  . Smokeless tobacco:  Former Network engineer  . Vaping Use: Never used  Substance Use Topics  . Alcohol use: Never  . Drug use: Never    Review of Systems Per HPI unless specifically indicated above     Objective:    BP (!) 158/86 (BP Location: Left Arm, Cuff Size: Normal)   Pulse 62   Temp (!) 97.1 F (36.2 C) (Temporal)   Resp 16   Ht 5\' 7"  (1.702 m)   Wt 190 lb 9.6 oz (86.5 kg)   SpO2 99%   BMI 29.85 kg/m   Wt Readings from Last 3 Encounters:  06/30/20 190 lb 9.6 oz (86.5 kg)  03/23/20 180 lb (81.6 kg)  01/09/20 196 lb 9.6 oz (89.2 kg)    Physical Exam Vitals and nursing note reviewed.  Constitutional:      General: He is not in acute distress.    Appearance: He is well-developed. He is not diaphoretic.     Comments: Well-appearing, comfortable, cooperative  HENT:     Head: Normocephalic and atraumatic.     Right Ear: Tympanic membrane, ear canal and external ear normal.     Ears:     Comments: Left ear obstructed by cotton swab tip blocking ear canal. It is near the opening - removed it using sterile pickup forceps by pinching the edge of cotton and gently pulling out. Slight abrasion of ear canal from where the cotton had adhered to the wall, now TM is normal rest of ear exam is benign,  no wax. Eyes:     General:        Right eye: No discharge.        Left eye: No discharge.     Conjunctiva/sclera: Conjunctivae normal.  Neck:     Thyroid: No thyromegaly.  Cardiovascular:     Rate and Rhythm: Normal rate and regular rhythm.     Heart sounds: Normal heart sounds. No murmur heard.   Pulmonary:     Effort: Pulmonary effort is normal. No respiratory distress.     Breath sounds: Normal breath sounds. No wheezing or rales.  Musculoskeletal:        General: Normal range of motion.     Cervical back: Normal range of motion and neck supple.     Right lower leg: No edema.     Left lower leg: No edema.  Lymphadenopathy:     Cervical: No cervical adenopathy.  Skin:    General: Skin  is warm and dry.     Findings: No erythema or rash.  Neurological:     Mental Status: He is alert and oriented to person, place, and time.  Psychiatric:        Behavior: Behavior normal.     Comments: Well groomed, good eye contact, normal speech and thoughts       Results for orders placed or performed in visit on 03/23/20  Hemoglobin A1c  Result Value Ref Range   Hgb A1c MFr Bld 5.3 <5.7 % of total Hgb   Mean Plasma Glucose 105 (calc)   eAG (mmol/L) 5.8 (calc)  COMPLETE METABOLIC PANEL WITH GFR  Result Value Ref Range   Glucose, Bld 142 (H) 65 - 99 mg/dL   BUN 18 7 - 25 mg/dL   Creat 1.49 (H) 0.70 - 1.11 mg/dL   GFR, Est Non African American 42 (L) > OR = 60 mL/min/1.60m2   GFR, Est African American 49 (L) > OR = 60 mL/min/1.76m2   BUN/Creatinine Ratio 12 6 - 22 (calc)   Sodium 143 135 - 146 mmol/L   Potassium 3.9 3.5 - 5.3 mmol/L   Chloride 103 98 - 110 mmol/L   CO2 29 20 - 32 mmol/L   Calcium 9.6 8.6 - 10.3 mg/dL   Total Protein 7.0 6.1 - 8.1 g/dL   Albumin 4.6 3.6 - 5.1 g/dL   Globulin 2.4 1.9 - 3.7 g/dL (calc)   AG Ratio 1.9 1.0 - 2.5 (calc)   Total Bilirubin 1.5 (H) 0.2 - 1.2 mg/dL   Alkaline phosphatase (APISO) 78 35 - 144 U/L   AST 17 10 - 35 U/L   ALT 8 (L) 9 - 46 U/L  Uric acid  Result Value Ref Range   Uric Acid, Serum 9.8 (H) 4.0 - 8.0 mg/dL      Assessment & Plan:   Problem List Items Addressed This Visit    Osteoarthritis of multiple joints    Persistent chronic R > L generalized Knee pain, with known advanced osteoarthritis DJD With some inc swelling No new injury or problem No prior history of knee surgery, arthroscopy Partial improvement on med management Last X-ray 06/2018 reviewed  Plan: Continue TOPICAL NSAID only  Continue Tylenol high dose 1g TID PRN RICE therapy (rest, ice, compression, elevation) for swelling, activity modification  If injection ineffective or only short duration will consider referral to Orthopedics      OSA  (obstructive sleep apnea)    Unable to tolerate CPAP mask Followed by [facility] Neuro Suspect some OSA  may be raising BP chronically if suboptimal treatment      CLL (chronic lymphocytic leukemia) (HCC)    Surveillance, stage 0 Followed by [facility] Heme/Onc Not indicated for treatment at this time      CKD (chronic kidney disease), stage III ([place])    See HTN A&P Complication with CKDIII Avoid oral nsaid, use topical Control HTN Avoid ACEi for now  Add HCTZ for BP control, will recheck chemistry Cr in 6 weeks      Benign hypertension with CKD (chronic kidney disease) stage III (HCC) - Primary    Elevated BP, repeat manual improved still elevated - Home BP readings reportedly normal in past, but not checking lately Complicated by CKD-III, AFib OFF Amlodipine 5 (Swelling), Lisinopril 40 (AKI)  Plan:  1. START new HCTZ 25mg  daily for both diuretic effect and BP lowering - Continue Atenolol 50mg  daily - LIMIT Furosemide use, 40mg  every other day PRN edema - now with HCTZ may be able to reduce frequency of furosemide - Future reconsider ARB Losartan as option - if creatinine stable  2. Encourage improved lifestyle - low sodium diet, regular exercise as tolerated 3. Continue monitor BP outside office, bring readings to next visit, if persistently >140/90 or new symptoms notify office sooner  Check chemistry in 6 weeks follow-up BP      Relevant Medications   hydrochlorothiazide (HYDRODIURIL) 25 MG tablet   Other Relevant Orders   BASIC METABOLIC PANEL WITH GFR      Meds ordered this encounter  Medications  . hydrochlorothiazide (HYDRODIURIL) 25 MG tablet    Sig: Take 1 tablet (25 mg total) by mouth daily.    Dispense:  30 tablet    Refill:  2      Follow up plan: Return in about 6 weeks (around 08/11/2020) for 6 week non fasting lab only then 1-2 weeks later HTN, CKD lab.   Nobie Putnam, [facility] 06/30/2020, 9:02 AM

## 2020-06-30 NOTE — Assessment & Plan Note (Signed)
Elevated BP, repeat manual improved still elevated - Home BP readings reportedly normal in past, but not checking lately Complicated by CKD-III, AFib OFF Amlodipine 5 (Swelling), Lisinopril 40 (AKI)  Plan:  1. START new HCTZ 25mg  daily for both diuretic effect and BP lowering - Continue Atenolol 50mg  daily - LIMIT Furosemide use, 40mg  every other day PRN edema - now with HCTZ may be able to reduce frequency of furosemide - Future reconsider ARB Losartan as option - if creatinine stable  2. Encourage improved lifestyle - low sodium diet, regular exercise as tolerated 3. Continue monitor BP outside office, bring readings to next visit, if persistently >140/90 or new symptoms notify office sooner  Check chemistry in 6 weeks follow-up BP

## 2020-06-30 NOTE — Assessment & Plan Note (Signed)
See HTN A&P Complication with CKDIII Avoid oral nsaid, use topical Control HTN Avoid ACEi for now  Add HCTZ for BP control, will recheck chemistry Cr in 6 weeks

## 2020-06-30 NOTE — Assessment & Plan Note (Signed)
Unable to tolerate CPAP mask Followed by [facility] Neuro Suspect some OSA may be raising BP chronically if suboptimal treatment

## 2020-07-06 ENCOUNTER — Ambulatory Visit (INDEPENDENT_AMBULATORY_CARE_PROVIDER_SITE_OTHER): Payer: Medicare Other

## 2020-07-06 VITALS — Ht 67.0 in | Wt 190.0 lb

## 2020-07-06 DIAGNOSIS — Z0000 Encounter for general adult medical examination without abnormal findings: Secondary | ICD-10-CM

## 2020-07-06 NOTE — Progress Notes (Signed)
I connected with Robert [name] today by telephone and verified that I am speaking with the correct person using two identifiers. Location patient: home Location provider: work Persons participating in the virtual visit: [employer] , granddaughter Cicero Duck LPN.   I discussed the limitations, risks, security and privacy concerns of performing an evaluation and management service by telephone and the availability of in person appointments. I also discussed with the patient that there may be a patient responsible charge related to this service. The patient expressed understanding and verbally consented to this telephonic visit.    Interactive audio and video telecommunications were attempted between this provider and patient, however failed, due to patient having technical difficulties OR patient did not have access to video capability.  We continued and completed visit with audio only.     Vital signs may be patient reported or missing.  Subjective:   Robert [name] is a 86 y.o. male who presents for Medicare Annual/Subsequent preventive examination.  Review of Systems     Cardiac Risk Factors include: advanced age (>65men, >37 women);dyslipidemia;hypertension;male gender;sedentary lifestyle     Objective:    Today's Vitals   07/06/20 0830  Weight: 190 lb (86.2 kg)  Height: 5\' 7"  (1.702 m)   Body mass index is 29.76 kg/m.  Advanced Directives 07/06/2020 06/03/2019  Does Patient Have a Medical Advance Directive? Yes No  Type of Paramedic of [place];Living will -    Current Medications (verified) Outpatient Encounter Medications as of 07/06/2020  Medication Sig  . atenolol (TENORMIN) 50 MG tablet Take 1 tablet (50 mg total) by mouth daily.  . diclofenac Sodium (VOLTAREN) 1 % GEL Apply 2 g topically 4 (four) times daily as needed (right knee arthritis pain).  Marland Kitchen ELIQUIS 2.5 MG TABS tablet Take 1 tablet (2.5 mg total) by mouth 2 (two)  times daily.  . furosemide (LASIX) 40 MG tablet Take 0.5-1 tablets (20-40 mg total) by mouth daily as needed for fluid or edema.  . hydrochlorothiazide (HYDRODIURIL) 25 MG tablet Take 1 tablet (25 mg total) by mouth daily.  . Melatonin 10 MG TABS Take 10 mg by mouth at bedtime.  Marland Kitchen allopurinol (ZYLOPRIM) 100 MG tablet Take 1 tablet (100 mg total) by mouth daily. (Patient not taking: Reported on 07/06/2020)   No facility-administered encounter medications on file as of 07/06/2020.    Allergies (verified) Patient has no known allergies.   History: Past Medical History:  Diagnosis Date  . Gout   . Hyperlipidemia   . Hypertension    Past Surgical History:  Procedure Laterality Date  . NO PAST SURGERIES     Family History  Family history unknown: Yes   Social History   Socioeconomic History  . Marital status: Widowed    Spouse name: Not on file  . Number of children: Not on file  . Years of education: Not on file  . Highest education level: Not on file  Occupational History  . Occupation: retired  Tobacco Use  . Smoking status: Former Smoker    Quit date: 1985    Years since quitting: 36.9  . Smokeless tobacco: Former Network engineer  . Vaping Use: Never used  Substance and Sexual Activity  . Alcohol use: Never  . Drug use: Never  . Sexual activity: Not on file  Other Topics Concern  . Not on file  Social History Narrative  . Not on file   Social Determinants of Health   Financial Resource Strain:  Low Risk   . Difficulty of Paying Living Expenses: Not hard at all  Food Insecurity: No Food Insecurity  . Worried About Charity fundraiser in the Last Year: Never true  . Ran Out of Food in the Last Year: Never true  Transportation Needs: No Transportation Needs  . Lack of Transportation (Medical): No  . Lack of Transportation (Non-Medical): No  Physical Activity: Inactive  . Days of Exercise per Week: 0 days  . Minutes of Exercise per Session: 0 min  Stress: No  Stress Concern Present  . Feeling of Stress : Not at all  Social Connections:   . Frequency of Communication with Friends and Family: Not on file  . Frequency of Social Gatherings with Friends and Family: Not on file  . Attends Religious Services: Not on file  . Active Member of Clubs or Organizations: Not on file  . Attends Archivist Meetings: Not on file  . Marital Status: Not on file    Tobacco Counseling Counseling given: Not Answered   Clinical Intake:  Pre-visit preparation completed: Yes  Pain : No/denies pain     Nutritional Status: BMI 25 -29 Overweight Nutritional Risks: None Diabetes: No  How often do you need to have someone help you when you read instructions, pamphlets, or other written materials from your doctor or pharmacy?: 1 - Never What is the last grade level you completed in school?: 5th grade  Diabetic? no  Interpreter Needed?: No  Information entered by :: NAllen LPN   Activities of Daily Living In your present state of health, do you have any difficulty performing the following activities: 07/06/2020 06/30/2020  Hearing? N Y  Vision? N N  Difficulty concentrating or making decisions? Y N  Comment sometimes -  Walking or climbing stairs? N Y  Dressing or bathing? N N  Doing errands, shopping? Robert [name]  Comment grand daughter takes -  Conservation officer, nature and eating ? N -  Using the Toilet? N -  In the past six months, have you accidently leaked urine? N -  Do you have problems with loss of bowel control? N -  Managing your Medications? Y -  Comment brother sets up -  Managing your Finances? Y -  Comment family helps -  Housekeeping or managing your Housekeeping? N -  Some recent data might be hidden    Patient Care Team: Olin Hauser, DO as PCP - General (Family Medicine)  Indicate any recent Medical Services you may have received from other than [facility] providers in the past year (date may be approximate).      Assessment:   This is a routine wellness examination for [name].  Hearing/Vision screen  Hearing Screening   125Hz  250Hz  500Hz  1000Hz  2000Hz  3000Hz  4000Hz  6000Hz  8000Hz   Right ear:           Left ear:           Vision Screening Comments: No regular eye exams  Dietary issues and exercise activities discussed: Current Exercise Habits: The patient does not participate in regular exercise at present  Goals    . Patient Stated     07/06/2020, drink more water      Depression Screen PHQ 2/9 Scores 07/06/2020 10/29/2019 06/04/2019 06/03/2019 11/08/2018 11/04/2018 07/03/2018  PHQ - 2 Score 0 0 0 0 0 0 0    Fall Risk Fall Risk  07/06/2020 06/30/2020 10/29/2019 06/04/2019 06/03/2019  Falls in the past year? 0 0 0 0 0  Number falls in past yr: - 0 0 - 0  Injury with Fall? - 0 0 - 0  Risk for fall due to : Medication side effect - - - -  Follow up Falls evaluation completed;Education provided;Falls prevention discussed Falls evaluation completed Falls evaluation completed Falls evaluation completed -    Any stairs in or around the home? No  If so, are there any without handrails? n/a Home free of loose throw rugs in walkways, pet beds, electrical cords, etc? Yes  Adequate lighting in your home to reduce risk of falls? Yes   ASSISTIVE DEVICES UTILIZED TO PREVENT FALLS:  Life alert? No  Use of a cane, walker or w/c? No  Grab bars in the bathroom? Yes  Shower chair or bench in shower? Yes  Elevated toilet seat or a handicapped toilet? Yes   TIMED UP AND GO:  Was the test performed? No   Cognitive Function:     6CIT Screen 07/06/2020  What Year? 0 points  What month? 0 points  What time? 0 points  Count back from 20 2 points  Months in reverse 4 points  Repeat phrase 6 points  Total Score 12    Immunizations Immunization History  Administered Date(s) Administered  . Fluad Quad(high Dose 65+) 04/26/2020  . Influenza, High Dose Seasonal PF 06/08/2017, 07/03/2018  .  Influenza,inj,Quad PF,6+ Mos 09/27/2016  . Influenza,inj,quad, With Preservative 06/05/2017, 05/19/2019  . Influenza-Unspecified 05/14/2019  . PFIZER SARS-COV-2 Vaccination 01/09/2020, 02/05/2020  . Pneumococcal Conjugate-13 02/08/2015  . Pneumococcal Polysaccharide-23 03/05/2018  . Tdap 09/26/2016    TDAP status: Up to date Flu Vaccine status: Up to date Pneumococcal vaccine status: Up to date Covid-19 vaccine status: Completed vaccines  Qualifies for Shingles Vaccine? Yes   Zostavax completed No   Shingrix Completed?: No.    Education has been provided regarding the importance of this vaccine. Patient has been advised to call insurance company to determine out of pocket expense if they have not yet received this vaccine. Advised may also receive vaccine at local pharmacy or Health Dept. Verbalized acceptance and understanding.  Screening Tests Health Maintenance  Topic Date Due  . TETANUS/TDAP  09/26/2026  . INFLUENZA VACCINE  Completed  . COVID-19 Vaccine  Completed  . PNA vac Low Risk Adult  Completed    Health Maintenance  There are no preventive care reminders to display for this patient.  Colorectal cancer screening: No longer required.   Lung Cancer Screening: (Low Dose CT Chest recommended if Age 35-80 years, 30 pack-year currently smoking OR have quit w/in 15years.) does not qualify.   Lung Cancer Screening Referral: no  Additional Screening:  Hepatitis C Screening: does not qualify;   Vision Screening: Recommended annual ophthalmology exams for early detection of glaucoma and other disorders of the eye. Is the patient up to date with their annual eye exam?  No  Who is the provider or what is the name of the office in which the patient attends annual eye exams? none If pt is not established with a provider, would they like to be referred to a provider to establish care? No .   Dental Screening: Recommended annual dental exams for proper oral hygiene   Community Resource Referral / Chronic Care Management: CRR required this visit?  No   CCM required this visit?  No      Plan:     I have personally reviewed and noted the following in the patient's chart:   . Medical and social  history . Use of alcohol, tobacco or illicit drugs  . Current medications and supplements . Functional ability and status . Nutritional status . Physical activity . Advanced directives . List of other physicians . Hospitalizations, surgeries, and ER visits in previous 12 months . Vitals . Screenings to include cognitive, depression, and falls . Referrals and appointments  In addition, I have reviewed and discussed with patient certain preventive protocols, quality metrics, and best practice recommendations. A written personalized care plan for preventive services as well as general preventive health recommendations were provided to patient.     Kellie Simmering, LPN   05/05/5746   Nurse Notes:

## 2020-07-06 NOTE — Patient Instructions (Signed)
Mr. Robert [name] , Thank you for taking time to come for your Medicare Wellness Visit. I appreciate your ongoing commitment to your health goals. Please review the following plan we discussed and let me know if I can assist you in the future.   Screening recommendations/referrals: Colonoscopy: not required Recommended yearly ophthalmology/optometry visit for glaucoma screening and checkup Recommended yearly dental visit for hygiene and checkup  Vaccinations: Influenza vaccine: completed 04/26/2020, due 03/07/2021 Pneumococcal vaccine: completed 03/05/2018 Tdap vaccine: completed 09/26/2016, due 09/26/2026 Shingles vaccine: discussed   Covid-19:  02/05/2020, 01/09/2020  Advanced directives: Please bring a copy of your POA (Power of Attorney) and/or Living Will to your next appointment.   Conditions/risks identified: none  Next appointment: Follow up in one year for your annual wellness visit.   Preventive Care 84 Years and Older, Male Preventive care refers to lifestyle choices and visits with your health care provider that can promote health and wellness. What does preventive care include?  A yearly physical exam. This is also called an annual well check.  Dental exams once or twice a year.  Routine eye exams. Ask your health care provider how often you should have your eyes checked.  Personal lifestyle choices, including:  Daily care of your teeth and gums.  Regular physical activity.  Eating a healthy diet.  Avoiding tobacco and drug use.  Limiting alcohol use.  Practicing safe sex.  Taking low doses of aspirin every day.  Taking vitamin and mineral supplements as recommended by your health care provider. What happens during an annual well check? The services and screenings done by your health care provider during your annual well check will depend on your age, overall health, lifestyle risk factors, and family history of disease. Counseling  Your health care provider may ask you  questions about your:  Alcohol use.  Tobacco use.  Drug use.  Emotional well-being.  Home and relationship well-being.  Sexual activity.  Eating habits.  History of falls.  Memory and ability to understand (cognition).  Work and work Statistician. Screening  You may have the following tests or measurements:  Height, weight, and BMI.  Blood pressure.  Lipid and cholesterol levels. These may be checked every 5 years, or more frequently if you are over 35 years old.  Skin check.  Lung cancer screening. You may have this screening every year starting at age 69 if you have a 30-pack-year history of smoking and currently smoke or have quit within the past 15 years.  Fecal occult blood test (FOBT) of the stool. You may have this test every year starting at age 28.  Flexible sigmoidoscopy or colonoscopy. You may have a sigmoidoscopy every 5 years or a colonoscopy every 10 years starting at age 67.  Prostate cancer screening. Recommendations will vary depending on your family history and other risks.  Hepatitis C blood test.  Hepatitis B blood test.  Sexually transmitted disease (STD) testing.  Diabetes screening. This is done by checking your blood sugar (glucose) after you have not eaten for a while (fasting). You may have this done every 1-3 years.  Abdominal aortic aneurysm (AAA) screening. You may need this if you are a current or former smoker.  Osteoporosis. You may be screened starting at age 57 if you are at high risk. Talk with your health care provider about your test results, treatment options, and if necessary, the need for more tests. Vaccines  Your health care provider may recommend certain vaccines, such as:  Influenza vaccine. This is  recommended every year.  Tetanus, diphtheria, and acellular pertussis (Tdap, Td) vaccine. You may need a Td booster every 10 years.  Zoster vaccine. You may need this after age 68.  Pneumococcal 13-valent conjugate  (PCV13) vaccine. One dose is recommended after age 63.  Pneumococcal polysaccharide (PPSV23) vaccine. One dose is recommended after age 9. Talk to your health care provider about which screenings and vaccines you need and how often you need them. This information is not intended to replace advice given to you by your health care provider. Make sure you discuss any questions you have with your health care provider. Document Released: 08/20/2015 Document Revised: 04/12/2016 Document Reviewed: 05/25/2015 Elsevier Interactive Patient Education  2017 [place] Prevention in the Home Falls can cause injuries. They can happen to people of all ages. There are many things you can do to make your home safe and to help prevent falls. What can I do on the outside of my home?  Regularly fix the edges of walkways and driveways and fix any cracks.  Remove anything that might make you trip as you walk through a door, such as a raised step or threshold.  Trim any bushes or trees on the path to your home.  Use bright outdoor lighting.  Clear any walking paths of anything that might make someone trip, such as rocks or tools.  Regularly check to see if handrails are loose or broken. Make sure that both sides of any steps have handrails.  Any raised decks and porches should have guardrails on the edges.  Have any leaves, snow, or ice cleared regularly.  Use sand or salt on walking paths during winter.  Clean up any spills in your garage right away. This includes oil or grease spills. What can I do in the bathroom?  Use night lights.  Install grab bars by the toilet and in the tub and shower. Do not use towel bars as grab bars.  Use non-skid mats or decals in the tub or shower.  If you need to sit down in the shower, use a plastic, non-slip stool.  Keep the floor dry. Clean up any water that spills on the floor as soon as it happens.  Remove soap buildup in the tub or shower  regularly.  Attach bath mats securely with double-sided non-slip rug tape.  Do not have throw rugs and other things on the floor that can make you trip. What can I do in the bedroom?  Use night lights.  Make sure that you have a light by your bed that is easy to reach.  Do not use any sheets or blankets that are too big for your bed. They should not hang down onto the floor.  Have a firm chair that has side arms. You can use this for support while you get dressed.  Do not have throw rugs and other things on the floor that can make you trip. What can I do in the kitchen?  Clean up any spills right away.  Avoid walking on wet floors.  Keep items that you use a lot in easy-to-reach places.  If you need to reach something above you, use a strong step stool that has a grab bar.  Keep electrical cords out of the way.  Do not use floor polish or wax that makes floors slippery. If you must use wax, use non-skid floor wax.  Do not have throw rugs and other things on the floor that can make you trip.  What can I do with my stairs?  Do not leave any items on the stairs.  Make sure that there are handrails on both sides of the stairs and use them. Fix handrails that are broken or loose. Make sure that handrails are as long as the stairways.  Check any carpeting to make sure that it is firmly attached to the stairs. Fix any carpet that is loose or worn.  Avoid having throw rugs at the top or bottom of the stairs. If you do have throw rugs, attach them to the floor with carpet tape.  Make sure that you have a light switch at the top of the stairs and the bottom of the stairs. If you do not have them, ask someone to add them for you. What else can I do to help prevent falls?  Wear shoes that:  Do not have high heels.  Have rubber bottoms.  Are comfortable and fit you well.  Are closed at the toe. Do not wear sandals.  If you use a stepladder:  Make sure that it is fully  opened. Do not climb a closed stepladder.  Make sure that both sides of the stepladder are locked into place.  Ask someone to hold it for you, if possible.  Clearly mark and make sure that you can see:  Any grab bars or handrails.  First and last steps.  Where the edge of each step is.  Use tools that help you move around (mobility aids) if they are needed. These include:  Canes.  Walkers.  Scooters.  Crutches.  Turn on the lights when you go into a dark area. Replace any light bulbs as soon as they burn out.  Set up your furniture so you have a clear path. Avoid moving your furniture around.  If any of your floors are uneven, fix them.  If there are any pets around you, be aware of where they are.  Review your medicines with your doctor. Some medicines can make you feel dizzy. This can increase your chance of falling. Ask your doctor what other things that you can do to help prevent falls. This information is not intended to replace advice given to you by your health care provider. Make sure you discuss any questions you have with your health care provider. Document Released: 05/20/2009 Document Revised: 12/30/2015 Document Reviewed: 08/28/2014 Elsevier Interactive Patient Education  2017 [employer].

## 2020-07-09 ENCOUNTER — Other Ambulatory Visit: Payer: Self-pay | Admitting: Family Medicine

## 2020-07-09 DIAGNOSIS — I4821 Permanent atrial fibrillation: Secondary | ICD-10-CM

## 2020-08-07 DIAGNOSIS — Z87891 Personal history of nicotine dependence: Secondary | ICD-10-CM | POA: Insufficient documentation

## 2020-08-07 DIAGNOSIS — Z7901 Long term (current) use of anticoagulants: Secondary | ICD-10-CM | POA: Insufficient documentation

## 2020-08-10 ENCOUNTER — Other Ambulatory Visit: Payer: Self-pay | Admitting: Family Medicine

## 2020-08-10 DIAGNOSIS — I129 Hypertensive chronic kidney disease with stage 1 through stage 4 chronic kidney disease, or unspecified chronic kidney disease: Secondary | ICD-10-CM

## 2020-08-10 DIAGNOSIS — N183 Chronic kidney disease, stage 3 unspecified: Secondary | ICD-10-CM

## 2020-08-10 DIAGNOSIS — I872 Venous insufficiency (chronic) (peripheral): Secondary | ICD-10-CM

## 2020-08-16 ENCOUNTER — Other Ambulatory Visit: Payer: Self-pay

## 2020-08-16 ENCOUNTER — Other Ambulatory Visit: Payer: Medicare Other

## 2020-08-16 DIAGNOSIS — I129 Hypertensive chronic kidney disease with stage 1 through stage 4 chronic kidney disease, or unspecified chronic kidney disease: Secondary | ICD-10-CM | POA: Diagnosis not present

## 2020-08-16 DIAGNOSIS — N183 Chronic kidney disease, stage 3 unspecified: Secondary | ICD-10-CM

## 2020-08-17 ENCOUNTER — Other Ambulatory Visit: Payer: Self-pay | Admitting: Family Medicine

## 2020-08-17 DIAGNOSIS — I129 Hypertensive chronic kidney disease with stage 1 through stage 4 chronic kidney disease, or unspecified chronic kidney disease: Secondary | ICD-10-CM

## 2020-08-17 LAB — BASIC METABOLIC PANEL WITH GFR
BUN/Creatinine Ratio: 20 (calc) (ref 6–22)
BUN: 40 mg/dL — ABNORMAL HIGH (ref 7–25)
CO2: 26 mmol/L (ref 20–32)
Calcium: 9.8 mg/dL (ref 8.6–10.3)
Chloride: 107 mmol/L (ref 98–110)
Creat: 1.98 mg/dL — ABNORMAL HIGH (ref 0.70–1.11)
GFR, Est African American: 34 mL/min/{1.73_m2} — ABNORMAL LOW (ref >=60)
GFR, Est Non African American: 30 mL/min/{1.73_m2} — ABNORMAL LOW (ref >=60)
Glucose, Bld: 98 mg/dL (ref 65–99)
Potassium: 4.5 mmol/L (ref 3.5–5.3)
Sodium: 141 mmol/L (ref 135–146)

## 2020-08-25 ENCOUNTER — Ambulatory Visit: Payer: Medicare Other | Admitting: Family Medicine

## 2020-09-14 ENCOUNTER — Other Ambulatory Visit: Payer: Self-pay | Admitting: Family Medicine

## 2020-09-14 DIAGNOSIS — I4821 Permanent atrial fibrillation: Secondary | ICD-10-CM

## 2020-10-18 ENCOUNTER — Other Ambulatory Visit: Payer: Self-pay | Admitting: Family Medicine

## 2020-10-18 DIAGNOSIS — I4821 Permanent atrial fibrillation: Secondary | ICD-10-CM

## 2020-11-09 ENCOUNTER — Encounter: Payer: Self-pay | Admitting: Family Medicine

## 2020-11-09 ENCOUNTER — Other Ambulatory Visit: Payer: Self-pay

## 2020-11-09 ENCOUNTER — Ambulatory Visit (INDEPENDENT_AMBULATORY_CARE_PROVIDER_SITE_OTHER): Payer: Medicare Other | Admitting: Family Medicine

## 2020-11-09 VITALS — BP 169/83 | HR 83 | Ht 67.0 in | Wt 195.8 lb

## 2020-11-09 DIAGNOSIS — G8929 Other chronic pain: Secondary | ICD-10-CM

## 2020-11-09 DIAGNOSIS — M1A9XX0 Chronic gout, unspecified, without tophus (tophi): Secondary | ICD-10-CM | POA: Diagnosis not present

## 2020-11-09 DIAGNOSIS — M8949 Other hypertrophic osteoarthropathy, multiple sites: Secondary | ICD-10-CM

## 2020-11-09 DIAGNOSIS — I129 Hypertensive chronic kidney disease with stage 1 through stage 4 chronic kidney disease, or unspecified chronic kidney disease: Secondary | ICD-10-CM | POA: Diagnosis not present

## 2020-11-09 DIAGNOSIS — M25561 Pain in right knee: Secondary | ICD-10-CM | POA: Diagnosis not present

## 2020-11-09 DIAGNOSIS — M159 Polyosteoarthritis, unspecified: Secondary | ICD-10-CM

## 2020-11-09 DIAGNOSIS — N183 Chronic kidney disease, stage 3 unspecified: Secondary | ICD-10-CM | POA: Diagnosis not present

## 2020-11-09 DIAGNOSIS — M1A0610 Idiopathic chronic gout, right knee, without tophus (tophi): Secondary | ICD-10-CM

## 2020-11-09 DIAGNOSIS — M1711 Unilateral primary osteoarthritis, right knee: Secondary | ICD-10-CM

## 2020-11-09 DIAGNOSIS — J310 Chronic rhinitis: Secondary | ICD-10-CM

## 2020-11-09 DIAGNOSIS — C9110 Chronic lymphocytic leukemia of B-cell type not having achieved remission: Secondary | ICD-10-CM | POA: Diagnosis not present

## 2020-11-09 MED ORDER — ATENOLOL 50 MG PO TABS: 50.0000 mg | ORAL_TABLET | Freq: Every day | ORAL | 1 refills | Status: DC

## 2020-11-09 MED ORDER — HYDROCHLOROTHIAZIDE 25 MG PO TABS: 1.0000 | ORAL_TABLET | Freq: Every day | ORAL | 1 refills | Status: DC

## 2020-11-09 MED ORDER — FLUTICASONE PROPIONATE 50 MCG/ACT NA SUSP: 2.0000 | Freq: Every day | NASAL | 3 refills | Status: DC

## 2020-11-09 MED ORDER — ALLOPURINOL 100 MG PO TABS: 100.0000 mg | ORAL_TABLET | Freq: Every day | ORAL | 1 refills | Status: DC

## 2020-11-09 NOTE — Progress Notes (Signed)
Subjective:    Patient ID: Robert [name], male    DOB: [birth year], 87 y.o.   MRN: [id number]  Robert [name] is a 87 y.o. male presenting on 11/09/2020 for Knee Pain  Accompanied by Earmon Phoenix .  HPI   CHRONIC HTN/ CKD -III Home reading BP infrequent. Today elevated Last lab 08/2020 They took HALF tab atenolol still based on orders from [facility] -Atenolol 50mg  daily, HCTZ 25mg  daily - Rarely taking Furosemide 40mg  (every other day) Swelling is well controlled Denies CP, dyspnea, HA, edema, dizziness / lightheadedness  Gout Last result- Uric acid 9.8(10/2019) - improved from prior result. History of chronic recurrent gout flare and knee pain No flares since last visit. They stopped taking this but can restart it Allopurinol 100mg  daily for preventing. Previously on 300mg .  Follow-up Knee PainRIGHT / Osteoarthritis/Bilateral Gout Arthritis See above Gout He is using topical Voltaren, and knee wrap - will look for knee brace now Prior steroid injections in past 11/2019 Last X-rays 2019 with severe osteoarthritis multiple compartments. - He has notseen Orthopedic. Chronic knee problem >10+ years Worsening pain overall, now has limited function with mobility problem. Denies injury recent fall or trauma, redness, swelling, numb tingling  Severe OSA Difficulty with insomnia Followed by [facility] Unable to use CPAP mask due to difficulty but some improvement able to sleep through night He is used to sleep schedule waking up at night.  Chronic Lymphocytic Leukemia Followed by [facility] Heme/Onc Last 03/2020, surveillance now, he is stage 0, no treatment at this time.   Depression screen [facility] 2/9 07/06/2020 10/29/2019 06/04/2019  Decreased Interest 0 0 0  Down, Depressed, Hopeless 0 0 0  PHQ - 2 Score 0 0 0    Social History   Tobacco Use  . Smoking status: Former Smoker    Quit date: 1985    Years since quitting: 37.2  . Smokeless  tobacco: Former Network engineer  . Vaping Use: Never used  Substance Use Topics  . Alcohol use: Never  . Drug use: Never    Review of Systems Per HPI unless specifically indicated above     Objective:    BP (!) 169/83   Pulse 83   Ht 5\' 7"  (1.702 m)   Wt 195 lb 12.8 oz (88.8 kg)   SpO2 98%   BMI 30.67 kg/m   Wt Readings from Last 3 Encounters:  11/09/20 195 lb 12.8 oz (88.8 kg)  07/06/20 190 lb (86.2 kg)  06/30/20 190 lb 9.6 oz (86.5 kg)    Physical Exam Vitals and nursing note reviewed.  Constitutional:      General: He is not in acute distress.    Appearance: He is well-developed. He is not diaphoretic.     Comments: Well-appearing, comfortable, cooperative  HENT:     Head: Normocephalic and atraumatic.  Eyes:     General:        Right eye: No discharge.        Left eye: No discharge.     Conjunctiva/sclera: Conjunctivae normal.  Neck:     Thyroid: No thyromegaly.  Cardiovascular:     Rate and Rhythm: Normal rate and regular rhythm.     Heart sounds: Normal heart sounds. No murmur heard.   Pulmonary:     Effort: Pulmonary effort is normal. No respiratory distress.     Breath sounds: Normal breath sounds. No wheezing or rales.  Musculoskeletal:     Cervical back: Normal  range of motion and neck supple.     Comments: Right Knee with bulky appearance, has crepitus and some joint line pain on range of motion.  Lymphadenopathy:     Cervical: No cervical adenopathy.  Skin:    General: Skin is warm and dry.     Findings: No erythema or rash.  Neurological:     Mental Status: He is alert and oriented to person, place, and time.  Psychiatric:        Behavior: Behavior normal.     Comments: Well groomed, good eye contact, normal speech and thoughts    Results for orders placed or performed in visit on 37/85/88  BASIC METABOLIC PANEL WITH GFR  Result Value Ref Range   Glucose, Bld 98 65 - 99 mg/dL   BUN 40 (H) 7 - 25 mg/dL   Creat 1.98 (H) 0.70 - 1.11  mg/dL   GFR, Est Non African American 30 (L) > OR = 60 mL/min/1.3m2   GFR, Est African American 34 (L) > OR = 60 mL/min/1.35m2   BUN/Creatinine Ratio 20 6 - 22 (calc)   Sodium 141 135 - 146 mmol/L   Potassium 4.5 3.5 - 5.3 mmol/L   Chloride 107 98 - 110 mmol/L   CO2 26 20 - 32 mmol/L   Calcium 9.8 8.6 - 10.3 mg/dL      Assessment & Plan:   Problem List Items Addressed This Visit    Primary osteoarthritis of right knee   Relevant Medications   allopurinol (ZYLOPRIM) 100 MG tablet   Other Relevant Orders   Ambulatory referral to Orthopedic Surgery   Osteoarthritis of multiple joints   Relevant Medications   allopurinol (ZYLOPRIM) 100 MG tablet   Other Relevant Orders   Ambulatory referral to Orthopedic Surgery   Idiopathic chronic gout of right knee without tophus   Relevant Medications   allopurinol (ZYLOPRIM) 100 MG tablet   Other Relevant Orders   Ambulatory referral to Orthopedic Surgery   Gout   Relevant Medications   allopurinol (ZYLOPRIM) 100 MG tablet   Other Relevant Orders   Ambulatory referral to Orthopedic Surgery   Chronic pain of right knee - Primary   Relevant Orders   Ambulatory referral to Orthopedic Surgery   Benign hypertension with CKD (chronic kidney disease) stage III (HCC)   Relevant Medications   atenolol (TENORMIN) 50 MG tablet   hydrochlorothiazide (HYDRODIURIL) 25 MG tablet    Other Visit Diagnoses    Chronic rhinitis       Relevant Medications   fluticasone (FLONASE) 50 MCG/ACT nasal spray      severe osteoarthritis bilateral knees, Right is worst with severe multiple compartments of osteoarthritis back on x-ray 2019, has history of chronic gout as well impacting both knees. He has failed steroid injection in office before and would benefit from orthopedic evaluation for further procedural or other treatment options.    #Gout Restart Allopurinol 100mg  daily  #HTN Switch directions on rx Atenolol to 50mg  daily Resent rx   Orders  Placed This Encounter  Procedures  . Ambulatory referral to Orthopedic Surgery    Referral Priority:   Routine    Referral Type:   Surgical    Referral Reason:   Specialty Services Required    Requested Specialty:   Orthopedic Surgery    Number of Visits Requested:   1     Meds ordered this encounter  Medications  . atenolol (TENORMIN) 50 MG tablet    Sig: Take 1 tablet (50  mg total) by mouth daily.    Dispense:  90 tablet    Refill:  1  . allopurinol (ZYLOPRIM) 100 MG tablet    Sig: Take 1 tablet (100 mg total) by mouth daily.    Dispense:  90 tablet    Refill:  1  . hydrochlorothiazide (HYDRODIURIL) 25 MG tablet    Sig: Take 1 tablet (25 mg total) by mouth daily.    Dispense:  90 tablet    Refill:  1  . fluticasone (FLONASE) 50 MCG/ACT nasal spray    Sig: Place 2 sprays into both nostrils daily. Use for 4-6 weeks then stop and use seasonally or as needed.    Dispense:  16 g    Refill:  3      Follow up plan: Return in about 3 months (around 02/08/2021) for 3 month follow-up Knee arthritis orthopedic / blood pressure.   Nobie Putnam, [facility] 11/09/2020, 10:54 AM

## 2020-11-09 NOTE — Patient Instructions (Addendum)
Thank you for coming to the office today.  Start nasal steroid Flonase 2 sprays in each nostril daily for 4-6 weeks, may repeat course seasonally or as needed  Recommend to start taking Tylenol Extra Strength 500mg  tabs - take 1 to 2 tabs per dose (max 1000mg ) every 6-8 hours for pain (take regularly, don't skip a dose for next 7 days), max 24 hour daily dose is 6 tablets or 3000mg . In the future you can repeat the same everyday Tylenol course for 1-2 weeks at a time.   Refilled other meds, list is printed.  [facility] [place], [place]  [address] Phone: [phone]     Please schedule a Follow-up Appointment to: Return in about 3 months (around 02/08/2021) for 3 month follow-up Knee arthritis orthopedic / blood pressure.  If you have any other questions or concerns, please feel free to call the office or send a message through [place]. You may also schedule an earlier appointment if necessary.  Additionally, you may be receiving a survey about your experience at our office within a few days to 1 week by e-mail or mail. We value your feedback.  Nobie Putnam, DO [place]

## 2021-02-02 ENCOUNTER — Other Ambulatory Visit: Payer: Self-pay | Admitting: Family Medicine

## 2021-02-02 DIAGNOSIS — I4821 Permanent atrial fibrillation: Secondary | ICD-10-CM

## 2021-02-18 ENCOUNTER — Ambulatory Visit: Payer: Medicare Other | Admitting: Family Medicine

## 2021-03-01 ENCOUNTER — Other Ambulatory Visit: Payer: Self-pay | Admitting: Family Medicine

## 2021-03-01 DIAGNOSIS — I4821 Permanent atrial fibrillation: Secondary | ICD-10-CM

## 2021-03-01 NOTE — Telephone Encounter (Signed)
Requested Prescriptions  Pending Prescriptions Disp Refills  . ELIQUIS 2.5 MG TABS tablet [Pharmacy Med Name: ELIQUIS 2.5 MG TABLET] 60 tablet 2    Sig: Take 1 tablet (2.5 mg total) by mouth 2 (two) times daily.     Hematology:  Anticoagulants Failed - 03/01/2021  5:09 PM      Failed - HGB in normal range and within 360 days    Hemoglobin  Date Value Ref Range Status  10/29/2019 12.1 (L) 13.2 - 17.1 g/dL Final         Failed - PLT in normal range and within 360 days    Platelets  Date Value Ref Range Status  10/29/2019 248 140 - 400 Thousand/uL Final         Failed - HCT in normal range and within 360 days    HCT  Date Value Ref Range Status  10/29/2019 38.8 38.5 - 50.0 % Final         Failed - Cr in normal range and within 360 days    Creat  Date Value Ref Range Status  08/16/2020 1.98 (H) 0.70 - 1.11 mg/dL Final    Comment:    For patients >76 years of age, the reference limit for Creatinine is approximately 13% higher for people identified as African-American. Renella Cunas - Valid encounter within last 12 months    Recent Outpatient Visits          3 months ago Chronic pain of right knee   [place], DO   8 months ago Benign hypertension with CKD (chronic kidney disease) stage III [facility])   [facility], Devonne Doughty, DO   11 months ago Benign hypertension with CKD (chronic kidney disease) stage III   [place], DO   1 year ago Primary osteoarthritis of right knee   [place], DO   1 year ago Chronic pain of right knee   [facility] Olin Hauser, DO      Future Appointments            In 4 months [facility], [facility]

## 2021-03-10 ENCOUNTER — Encounter: Payer: Self-pay | Admitting: Family Medicine

## 2021-03-10 ENCOUNTER — Other Ambulatory Visit: Payer: Self-pay | Admitting: Family Medicine

## 2021-03-10 ENCOUNTER — Ambulatory Visit (INDEPENDENT_AMBULATORY_CARE_PROVIDER_SITE_OTHER): Payer: Medicare Other | Admitting: Family Medicine

## 2021-03-10 ENCOUNTER — Other Ambulatory Visit: Payer: Self-pay

## 2021-03-10 VITALS — BP 127/72 | HR 63 | Ht 67.0 in | Wt 187.8 lb

## 2021-03-10 DIAGNOSIS — M1711 Unilateral primary osteoarthritis, right knee: Secondary | ICD-10-CM

## 2021-03-10 DIAGNOSIS — G8929 Other chronic pain: Secondary | ICD-10-CM | POA: Diagnosis not present

## 2021-03-10 DIAGNOSIS — M1A9XX0 Chronic gout, unspecified, without tophus (tophi): Secondary | ICD-10-CM | POA: Diagnosis not present

## 2021-03-10 DIAGNOSIS — N183 Chronic kidney disease, stage 3 unspecified: Secondary | ICD-10-CM

## 2021-03-10 DIAGNOSIS — I4821 Permanent atrial fibrillation: Secondary | ICD-10-CM

## 2021-03-10 DIAGNOSIS — M25561 Pain in right knee: Secondary | ICD-10-CM | POA: Diagnosis not present

## 2021-03-10 DIAGNOSIS — E7800 Pure hypercholesterolemia, unspecified: Secondary | ICD-10-CM

## 2021-03-10 DIAGNOSIS — Z0000 Encounter for general adult medical examination without abnormal findings: Secondary | ICD-10-CM

## 2021-03-10 DIAGNOSIS — R7303 Prediabetes: Secondary | ICD-10-CM

## 2021-03-10 DIAGNOSIS — M1A0410 Idiopathic chronic gout, right hand, without tophus (tophi): Secondary | ICD-10-CM | POA: Diagnosis not present

## 2021-03-10 DIAGNOSIS — C9110 Chronic lymphocytic leukemia of B-cell type not having achieved remission: Secondary | ICD-10-CM

## 2021-03-10 DIAGNOSIS — R351 Nocturia: Secondary | ICD-10-CM

## 2021-03-10 MED ORDER — PREDNISONE 10 MG PO TABS: ORAL_TABLET | ORAL | 0 refills | Status: DC

## 2021-03-10 MED ORDER — DICLOFENAC SODIUM 1 % EX GEL
2.0000 g | Freq: Four times a day (QID) | CUTANEOUS | 5 refills | Status: DC | PRN
Start: 2021-03-10 — End: 2023-01-31

## 2021-03-10 MED ORDER — ALLOPURINOL 100 MG PO TABS: 100.0000 mg | ORAL_TABLET | Freq: Every day | ORAL | 1 refills | Status: DC

## 2021-03-10 NOTE — Patient Instructions (Addendum)
Thank you for coming to the office today.  START anti inflammatory topical - OTC Voltaren (generic Diclofenac) topical 2-4 times a day as needed for pain swelling of affected joint for 1-2 weeks or longer.  Continue Allopurinol '100mg'$  daily, max dose  Try the [employer]  Prednisone steroid only if acute pain or worsening or return flare or future flare  DUE for FASTING BLOOD WORK (no food or drink after midnight before the lab appointment, only water or coffee without cream/sugar on the morning of)  SCHEDULE "Lab Only" visit in the morning at the clinic for lab draw in 3 MONTHS   - Make sure Lab Only appointment is at about 1 week before your next appointment, so that results will be available  For Lab Results, once available within 2-3 days of blood draw, you can can log in to MyChart online to view your results and a brief explanation. Also, we can discuss results at next follow-up visit.   Please schedule a Follow-up Appointment to: Return in about 3 months (around 06/10/2021) for 3 month fasting lab only then 1 week later Follow-up HTN, Gout, labs Flu Shot.  If you have any other questions or concerns, please feel free to call the office or send a message through [place]. You may also schedule an earlier appointment if necessary.  Additionally, you may be receiving a survey about your experience at our office within a few days to 1 week by e-mail or mail. We value your feedback.  Nobie Putnam, DO [place]

## 2021-03-10 NOTE — Progress Notes (Signed)
Subjective:    Patient ID: Robert [name], male    DOB: 10-15-1931, 87 y.o.   MRN: [id number]  Robert [name] is a 87 y.o. male presenting on 03/10/2021 for Gout   HPI  Gout Flare, Acute Reports onset 4 days ago with R hand MCP joints with swelling stiffness and pain and also R foot swelling and pain with gout flare  Today some improvement.  Last Uric Acid 9.8 (03/2020), prior result 12.7 (10/2019)  Continues on Allopurinol '100mg'$  daily Tried Black Cherry juice in past with good results.  Has CKD-III  Denies fever chills other joint pain   Depression screen [facility] 2/9 03/10/2021 07/06/2020 10/29/2019  Decreased Interest 0 0 0  Down, Depressed, Hopeless 0 0 0  PHQ - 2 Score 0 0 0  Altered sleeping 0 - -  Tired, decreased energy 0 - -  Change in appetite 0 - -  Feeling bad or failure about yourself  0 - -  Trouble concentrating 0 - -  Moving slowly or fidgety/restless 0 - -  Suicidal thoughts 0 - -  PHQ-9 Score 0 - -  Difficult doing work/chores Not difficult at all - -    Social History   Tobacco Use   Smoking status: Former    Types: Cigarettes    Quit date: 1985    Years since quitting: 37.6   Smokeless tobacco: Former  Scientific laboratory technician Use: Never used  Substance Use Topics   Alcohol use: Never   Drug use: Never    Review of Systems Per HPI unless specifically indicated above     Objective:    BP 127/72   Pulse 63   Ht '5\' 7"'$  (1.702 m)   Wt 187 lb 12.8 oz (85.2 kg)   SpO2 99%   BMI 29.41 kg/m   Wt Readings from Last 3 Encounters:  03/10/21 187 lb 12.8 oz (85.2 kg)  11/09/20 195 lb 12.8 oz (88.8 kg)  07/06/20 190 lb (86.2 kg)    Physical Exam Vitals and nursing note reviewed.  Constitutional:      General: He is not in acute distress.    Appearance: Normal appearance. He is well-developed. He is not diaphoretic.     Comments: Well-appearing, comfortable, cooperative  HENT:     Head: Normocephalic and atraumatic.  Eyes:     General:        Right  eye: No discharge.        Left eye: No discharge.     Conjunctiva/sclera: Conjunctivae normal.  Cardiovascular:     Rate and Rhythm: Normal rate.  Pulmonary:     Effort: Pulmonary effort is normal.  Musculoskeletal:     Comments: R hand MCP joints bulky with deformity some localized swelling without erythema  L side MCP some deformity not as severe  Has range of motion some stiffness  Skin:    General: Skin is warm and dry.     Findings: No erythema or rash.  Neurological:     Mental Status: He is alert and oriented to person, place, and time.  Psychiatric:        Mood and Affect: Mood normal.        Behavior: Behavior normal.        Thought Content: Thought content normal.     Comments: Well groomed, good eye contact, normal speech and thoughts     Results for orders placed or performed in visit on [id number]  BASIC METABOLIC PANEL WITH GFR  Result Value Ref Range   Glucose, Bld 98 65 - 99 mg/dL   BUN 40 (H) 7 - 25 mg/dL   Creat 1.98 (H) 0.70 - 1.11 mg/dL   GFR, Est Non African American 30 (L) > OR = 60 mL/min/1.28m   GFR, Est African American 34 (L) > OR = 60 mL/min/1.725m  BUN/Creatinine Ratio 20 6 - 22 (calc)   Sodium 141 135 - 146 mmol/L   Potassium 4.5 3.5 - 5.3 mmol/L   Chloride 107 98 - 110 mmol/L   CO2 26 20 - 32 mmol/L   Calcium 9.8 8.6 - 10.3 mg/dL      Assessment & Plan:   Problem List Items Addressed This Visit     Primary osteoarthritis of right knee   Relevant Medications   diclofenac Sodium (VOLTAREN) 1 % GEL   allopurinol (ZYLOPRIM) 100 MG tablet   predniSONE (DELTASONE) 10 MG tablet   Gout - Primary   Relevant Medications   allopurinol (ZYLOPRIM) 100 MG tablet   predniSONE (DELTASONE) 10 MG tablet   Chronic pain of right knee   Relevant Medications   diclofenac Sodium (VOLTAREN) 1 % GEL    Acute Gout Last uric acid 1 year ago >9 On Allopurinol '100mg'$  daily, limited by CKD, cannot inc dose Recent acute gout flare 4 days ago, now  improving Likely chronic component with OA/DJD gouty arthritis  Restart Voltaren topical - new rx generic sent in Refill Allopurinol Labs upcoming 3 month w/ Uric Acid Dietary advice Add Prednisone 6 day taper for acute flare if worse.   Meds ordered this encounter  Medications   diclofenac Sodium (VOLTAREN) 1 % GEL    Sig: Apply 2 g topically 4 (four) times daily as needed (right knee arthritis pain).    Dispense:  100 g    Refill:  5   allopurinol (ZYLOPRIM) 100 MG tablet    Sig: Take 1 tablet (100 mg total) by mouth daily.    Dispense:  90 tablet    Refill:  1   predniSONE (DELTASONE) 10 MG tablet    Sig: Take 6 tabs with breakfast Day 1, 5 tabs Day 2, 4 tabs Day 3, 3 tabs Day 4, 2 tabs Day 5, 1 tab Day 6.    Dispense:  21 tablet    Refill:  0       Follow up plan: Return in about 3 months (around 06/10/2021) for 3 month fasting lab only then 1 week later Follow-up HTN, Gout, labs Flu Shot.  Future labs 06/2021 Uric Acid and all labs yearly   AlNobie PutnamDOLakemontroup 03/10/2021, 8:58 AM

## 2021-05-31 DIAGNOSIS — C9110 Chronic lymphocytic leukemia of B-cell type not having achieved remission: Secondary | ICD-10-CM | POA: Diagnosis not present

## 2021-06-21 ENCOUNTER — Other Ambulatory Visit: Payer: Self-pay | Admitting: Family Medicine

## 2021-06-21 DIAGNOSIS — I129 Hypertensive chronic kidney disease with stage 1 through stage 4 chronic kidney disease, or unspecified chronic kidney disease: Secondary | ICD-10-CM

## 2021-06-21 DIAGNOSIS — N183 Chronic kidney disease, stage 3 unspecified: Secondary | ICD-10-CM

## 2021-06-21 DIAGNOSIS — I4821 Permanent atrial fibrillation: Secondary | ICD-10-CM

## 2021-06-21 NOTE — Telephone Encounter (Signed)
Requested Prescriptions  Pending Prescriptions Disp Refills  . ELIQUIS 2.5 MG TABS tablet [Pharmacy Med Name: ELIQUIS 2.5 MG TABLET] 60 tablet 0    Sig: Take 1 tablet (2.5 mg total) by mouth 2 (two) times daily.     Hematology:  Anticoagulants Failed - 06/21/2021  2:56 PM      Failed - HGB in normal range and within 360 days    Hemoglobin  Date Value Ref Range Status  10/29/2019 12.1 (L) 13.2 - 17.1 g/dL Final         Failed - PLT in normal range and within 360 days    Platelets  Date Value Ref Range Status  10/29/2019 248 140 - 400 Thousand/uL Final         Failed - HCT in normal range and within 360 days    HCT  Date Value Ref Range Status  10/29/2019 38.8 38.5 - 50.0 % Final         Failed - Cr in normal range and within 360 days    Creat  Date Value Ref Range Status  08/16/2020 1.98 (H) 0.70 - 1.11 mg/dL Final    Comment:    For patients >67 years of age, the reference limit for Creatinine is approximately 13% higher for people identified as African-American. Renella Cunas - Valid encounter within last 12 months    Recent Outpatient Visits          3 months ago Chronic gout of right hand, unspecified cause   [place], DO   7 months ago Chronic pain of right knee   [place], DO   11 months ago Benign hypertension with CKD (chronic kidney disease) stage III [facility])   [place], DO   1 year ago Benign hypertension with CKD (chronic kidney disease) stage III   [place], DO   1 year ago Primary osteoarthritis of right knee   [place], DO      Future Appointments            In 2 weeks [facility], [facility]            . atenolol (TENORMIN) 50 MG tablet [Pharmacy Med Name: ATENOLOL 50 MG TABLET] 90 tablet 0    Sig: Take 1 tablet (50  mg total) by mouth daily.     Cardiovascular:  Beta Blockers Passed - 06/21/2021  2:56 PM      Passed - Last BP in normal range    BP Readings from Last 1 Encounters:  03/10/21 127/72         Passed - Last Heart Rate in normal range    Pulse Readings from Last 1 Encounters:  03/10/21 63         Passed - Valid encounter within last 6 months    Recent Outpatient Visits          3 months ago Chronic gout of right hand, unspecified cause   [place], DO   7 months ago Chronic pain of right knee   [place], DO   11 months ago Benign hypertension with CKD (chronic kidney disease) stage III [facility]   [place], DO   1 year ago Benign  hypertension with CKD (chronic kidney disease) stage III   [place], DO   1 year ago Primary osteoarthritis of right knee   [place], DO      Future Appointments            In 2 weeks [facility], [place]

## 2021-06-21 NOTE — Telephone Encounter (Signed)
Requested medication (s) are due for refill today: Yes  Requested medication (s) are on the active medication list: Yes  Last refill:  03/01/21 #60/2RF  Future visit scheduled: Yes  Notes to clinic:  Unable to refill per protocol due to failed labs, no updated results.    Requested Prescriptions  Pending Prescriptions Disp Refills   ELIQUIS 2.5 MG TABS tablet [Pharmacy Med Name: ELIQUIS 2.5 MG TABLET] 60 tablet 0    Sig: Take 1 tablet (2.5 mg total) by mouth 2 (two) times daily.     Hematology:  Anticoagulants Failed - 06/21/2021  2:56 PM      Failed - HGB in normal range and within 360 days    Hemoglobin  Date Value Ref Range Status  10/29/2019 12.1 (L) 13.2 - 17.1 g/dL Final          Failed - PLT in normal range and within 360 days    Platelets  Date Value Ref Range Status  10/29/2019 248 140 - 400 Thousand/uL Final          Failed - HCT in normal range and within 360 days    HCT  Date Value Ref Range Status  10/29/2019 38.8 38.5 - 50.0 % Final          Failed - Cr in normal range and within 360 days    Creat  Date Value Ref Range Status  08/16/2020 1.98 (H) 0.70 - 1.11 mg/dL Final    Comment:    For patients >68 years of age, the reference limit for Creatinine is approximately 13% higher for people identified as African-American. Renella Cunas - Valid encounter within last 12 months    Recent Outpatient Visits           3 months ago Chronic gout of right hand, unspecified cause   [place], DO   7 months ago Chronic pain of right knee   [facility], Devonne Doughty, DO   11 months ago Benign hypertension with CKD (chronic kidney disease) stage III [facility])   [facility] Olin Hauser, DO   1 year ago Benign hypertension with CKD (chronic kidney disease) stage III   [place], DO   1 year ago Primary osteoarthritis  of right knee   [place], DO       Future Appointments             In 2 weeks [facility], [place]             Signed Prescriptions Disp Refills   atenolol (TENORMIN) 50 MG tablet 90 tablet 0    Sig: Take 1 tablet (50 mg total) by mouth daily.     Cardiovascular:  Beta Blockers Passed - 06/21/2021  2:56 PM      Passed - Last BP in normal range    BP Readings from Last 1 Encounters:  03/10/21 127/72          Passed - Last Heart Rate in normal range    Pulse Readings from Last 1 Encounters:  03/10/21 63          Passed - Valid encounter within last 6 months    Recent Outpatient Visits           3 months ago Chronic gout of right hand, unspecified cause  [place], DO   7 months ago Chronic pain of right knee   [facility] Olin Hauser, DO   11 months ago Benign hypertension with CKD (chronic kidney disease) stage III [facility])   [place], DO   1 year ago Benign hypertension with CKD (chronic kidney disease) stage III   [place], DO   1 year ago Primary osteoarthritis of right knee   [place], DO       Future Appointments             In 2 weeks [facility], [facility]

## 2021-07-06 ENCOUNTER — Ambulatory Visit: Payer: Self-pay

## 2021-07-06 NOTE — Telephone Encounter (Signed)
Patient's granddaughter Randall An, on [place], called and says the patient is having left hip/knee pain. She says he fell about 2 weeks ago on Thursday 06/23/21 and on Sunday 06/26/21 he was not able to get OOB. She is not with the patient currently and says the pain is moderate-severe sharp pain that has been ongoing, but he is asking to go to the doctor to have an xray. Advised no availability with PCP within 3 days as recommended, she says she will take the earliest morning appointment with any provider. Appointment scheduled for Wednesday 07/13/21 at 0900 with Webb Silversmith, NP-C, care advice given, advised to go to [facility], she says she will try to convince him to go there, but she wanted the appointment as well. Randall An verbalized understanding of advice given.     Reason for Disposition  [1] MODERATE pain (e.g., interferes with normal activities, limping) AND [2] present > 3 days  Answer Assessment - Initial Assessment Questions 1. LOCATION and RADIATION: "Where is the pain located?"      Left hip 2. QUALITY: "What does the pain feel like?"  (e.g., sharp, dull, aching, burning)     Sharp 3. SEVERITY: "How bad is the pain?" "What does it keep you from doing?"   (Scale 1-10; or mild, moderate, severe)   -  MILD (1-3): doesn't interfere with normal activities    -  MODERATE (4-7): interferes with normal activities (e.g., work or school) or awakens from sleep, limping    -  SEVERE (8-10): excruciating pain, unable to do any normal activities, unable to walk     Moderate-Severe (Ongoing pain) 4. ONSET: "When did the pain start?" "Does it come and go, or is it there all the time?"     2 Sundays ago couldn't get OOB 5. WORK OR EXERCISE: "Has there been any recent work or exercise that involved this part of the body?"      N/A 6. CAUSE: "What do you think is causing the hip pain?"      Fall 2 weeks ago 7. AGGRAVATING FACTORS: "What makes the hip pain worse?" (e.g., walking, climbing stairs,  running)      Walking 8. OTHER SYMPTOMS: "Do you have any other symptoms?" (e.g., back pain, pain shooting down leg,  fever, rash)     Left Knee pain  Protocols used: Hip Pain-A-AH

## 2021-07-06 NOTE — Telephone Encounter (Signed)
Pt's granddaughter called saying her grandfather is having hip and knee pain and wants to be seen this week.  There ar no apps until next week with Dr. Raliegh Ip.   2nd attempt to contact patient to review sx of knee and hip pain. No answer, left message on voicemail to call clinic back at [phone].

## 2021-07-06 NOTE — Telephone Encounter (Signed)
Pt's granddaughter called saying her grandfather is having hip and knee pain and wants to be seen this week.  There ar no apps until next week with Dr. Raliegh Ip.   CB#  [phone]   Left message to call back.

## 2021-07-07 ENCOUNTER — Ambulatory Visit: Payer: Medicare Other

## 2021-07-13 ENCOUNTER — Ambulatory Visit: Payer: Medicare Other | Admitting: Internal Medicine

## 2021-07-19 ENCOUNTER — Ambulatory Visit: Payer: Medicare Other | Admitting: Family Medicine

## 2021-07-21 ENCOUNTER — Other Ambulatory Visit: Payer: Self-pay

## 2021-07-21 ENCOUNTER — Ambulatory Visit (INDEPENDENT_AMBULATORY_CARE_PROVIDER_SITE_OTHER): Payer: Medicare Other | Admitting: Family Medicine

## 2021-07-21 ENCOUNTER — Encounter: Payer: Self-pay | Admitting: Family Medicine

## 2021-07-21 ENCOUNTER — Ambulatory Visit
Admission: RE | Admit: 2021-07-21 | Discharge: 2021-07-21 | Disposition: A | Discharge status: Home / Self Care | Payer: Medicare Other | Attending: Family Medicine | Admitting: Family Medicine

## 2021-07-21 ENCOUNTER — Ambulatory Visit
Admission: RE | Admit: 2021-07-21 | Discharge: 2021-07-21 | Disposition: A | Discharge status: Home / Self Care | Payer: Medicare Other | Source: Ambulatory Visit | Attending: Family Medicine | Admitting: Family Medicine

## 2021-07-21 VITALS — BP 154/73 | HR 51 | Ht 67.0 in | Wt 192.0 lb

## 2021-07-21 DIAGNOSIS — M1711 Unilateral primary osteoarthritis, right knee: Secondary | ICD-10-CM | POA: Insufficient documentation

## 2021-07-21 DIAGNOSIS — M159 Polyosteoarthritis, unspecified: Secondary | ICD-10-CM | POA: Diagnosis not present

## 2021-07-21 DIAGNOSIS — M25561 Pain in right knee: Secondary | ICD-10-CM

## 2021-07-21 DIAGNOSIS — G8929 Other chronic pain: Secondary | ICD-10-CM

## 2021-07-21 DIAGNOSIS — Z23 Encounter for immunization: Secondary | ICD-10-CM

## 2021-07-21 MED ORDER — GABAPENTIN 100 MG PO CAPS: ORAL_CAPSULE | ORAL | 1 refills | Status: DC

## 2021-07-21 NOTE — Progress Notes (Signed)
Subjective:    Patient ID: Robert [name], male    DOB: June 10, 1932, 87 y.o.   MRN: [id number]  Robert [name] is a 87 y.o. male presenting on 07/21/2021 for Knee Pain and Hip Pain   HPI  Follow-up Knee Pain RIGHT / Osteoarthritis / Bilateral Gout Arthritis PMH Gout  Last visit for knees 11/2020 referred to [facility], but the apt was never scheduled. They did not receive call.  Recent update, had a fall about 1 month ago, accidental lost footing at home, while going to bathroom, in doorway fell, did not hit head, hit R side hip and R knee.  He has had episodic pain R knee and hip. He has known chronic pain but has some temporary worsening pain. Limited relief with topical Voltaren and knee brace  Prior steroid injections in past 11/2019 Last X-rays 2019 with severe osteoarthritis multiple compartments. - He has not seen Orthopedic. Chronic knee problem >10+ years Worsening pain overall, now has limited function with mobility problem. Denies injury recent fall or trauma, redness, swelling, numb tingling    Depression screen [facility] 2/9 03/10/2021 07/06/2020 10/29/2019  Decreased Interest 0 0 0  Down, Depressed, Hopeless 0 0 0  PHQ - 2 Score 0 0 0  Altered sleeping 0 - -  Tired, decreased energy 0 - -  Change in appetite 0 - -  Feeling bad or failure about yourself  0 - -  Trouble concentrating 0 - -  Moving slowly or fidgety/restless 0 - -  Suicidal thoughts 0 - -  PHQ-9 Score 0 - -  Difficult doing work/chores Not difficult at all - -    Social History   Tobacco Use   Smoking status: Former    Types: Cigarettes    Quit date: 1985    Years since quitting: 37.9   Smokeless tobacco: Former  Scientific laboratory technician Use: Never used  Substance Use Topics   Alcohol use: Never   Drug use: Never    Review of Systems Per HPI unless specifically indicated above     Objective:    BP (!) 154/73    Pulse (!) 51    Ht 5\' 7"  (1.702 m)    Wt 192 lb (87.1 kg)    SpO2 100%     BMI 30.07 kg/m   Wt Readings from Last 3 Encounters:  07/21/21 192 lb (87.1 kg)  03/10/21 187 lb 12.8 oz (85.2 kg)  11/09/20 195 lb 12.8 oz (88.8 kg)    Physical Exam Vitals and nursing note reviewed.  Constitutional:      General: He is not in acute distress.    Appearance: Normal appearance. He is well-developed. He is not diaphoretic.     Comments: Well-appearing, comfortable, cooperative  HENT:     Head: Normocephalic and atraumatic.  Eyes:     General:        Right eye: No discharge.        Left eye: No discharge.     Conjunctiva/sclera: Conjunctivae normal.  Cardiovascular:     Rate and Rhythm: Normal rate.  Pulmonary:     Effort: Pulmonary effort is normal.  Musculoskeletal:     Comments: Bilateral Knees R worse than L with bulky knee deformity, significant crepitus, has range of motion mostly intact unchanged from baseline.  R hip non tender to direct compression test, no bony tenderness over trochanter. Has more gluteal muscular pain. No ecchymosis or hematoma. Int and Ext rotation of hip not provoking  hip pain  Skin:    General: Skin is warm and dry.     Findings: No erythema or rash.  Neurological:     Mental Status: He is alert and oriented to person, place, and time.  Psychiatric:        Mood and Affect: Mood normal.        Behavior: Behavior normal.        Thought Content: Thought content normal.     Comments: Well groomed, good eye contact, normal speech and thoughts   Results for orders placed or performed in visit on 96/78/93  BASIC METABOLIC PANEL WITH GFR  Result Value Ref Range   Glucose, Bld 98 65 - 99 mg/dL   BUN 40 (H) 7 - 25 mg/dL   Creat 1.98 (H) 0.70 - 1.11 mg/dL   GFR, Est Non African American 30 (L) > OR = 60 mL/min/1.47m2   GFR, Est African American 34 (L) > OR = 60 mL/min/1.55m2   BUN/Creatinine Ratio 20 6 - 22 (calc)   Sodium 141 135 - 146 mmol/L   Potassium 4.5 3.5 - 5.3 mmol/L   Chloride 107 98 - 110 mmol/L   CO2 26 20 - 32 mmol/L    Calcium 9.8 8.6 - 10.3 mg/dL      Assessment & Plan:   Problem List Items Addressed This Visit     Primary osteoarthritis of right knee   Relevant Medications   gabapentin (NEURONTIN) 100 MG capsule   Other Relevant Orders   Ambulatory referral to Orthopedic Surgery   DG Knee Complete 4 Views Right   Osteoarthritis of multiple joints   Relevant Medications   gabapentin (NEURONTIN) 100 MG capsule   Other Relevant Orders   Ambulatory referral to Orthopedic Surgery   Chronic pain of right knee - Primary   Relevant Medications   gabapentin (NEURONTIN) 100 MG capsule   Other Relevant Orders   Ambulatory referral to Orthopedic Surgery   DG Knee Complete 4 Views Right   Other Visit Diagnoses     Needs flu shot       Relevant Orders   Flu Vaccine QUAD High Dose(Fluad) (Completed)       severe osteoarthritis bilateral knees, Right is worst with severe multiple compartments of osteoarthritis back on x-ray 2019, has history of chronic gout as well impacting both knees. He has failed steroid injection in office before and would benefit from orthopedic evaluation for further procedural or other treatment options.   Referral again to [facility], last referral was unsuccessful, gave them contact # to schedule apt if need.  Flu Shot today  Fall injury - seems improving, likely contusion. No sign of fractured hip based on history and exam. Will check R Knee X-ray instead  Start therapy with Gabapentin, titrate, dose adjust accordingly. For chronic joint pain OA  Meds ordered this encounter  Medications   gabapentin (NEURONTIN) 100 MG capsule    Sig: Start 1 capsule daily, increase by 1 cap every 2-3 days as tolerated up to 3 times a day, or may take 3 at once in evening.    Dispense:  90 capsule    Refill:  1      Follow up plan: Return if symptoms worsen or fail to improve.  Nobie Putnam, [facility] 07/21/2021, 9:49  AM

## 2021-07-21 NOTE — Patient Instructions (Addendum)
Thank you for coming to the office today.  Referral to Ortho back in 11/2020, never scheduled. Go ahead and call them today or later this week. I have referred again today.  [facility] [place],   [id number] Phone: [phone]  Flu Shot today  Consider Gabapentin  Start Gabapentin 100mg  capsules, take at night for 2-3 nights only, and then increase to 2 times a day for a few days, and then may increase to 3 times a day, it may make you drowsy, if helps significantly at night only, then you can increase instead to 3 capsules at night, instead of 3 times a day - In the future if needed, we can significantly increase the dose if tolerated well, some common doses are 300mg  three times a day up to 600mg  three times a day, usually it takes several weeks or months to get to higher doses   Please schedule a Follow-up Appointment to: No follow-ups on file.  If you have any other questions or concerns, please feel free to call the office or send a message through [place]. You may also schedule an earlier appointment if necessary.  Additionally, you may be receiving a survey about your experience at our office within a few days to 1 week by e-mail or mail. We value your feedback.  Nobie Putnam, DO [place]

## 2021-07-22 ENCOUNTER — Ambulatory Visit: Payer: Medicare Other

## 2021-08-09 ENCOUNTER — Other Ambulatory Visit: Payer: Self-pay | Admitting: Family Medicine

## 2021-08-09 DIAGNOSIS — N183 Chronic kidney disease, stage 3 unspecified: Secondary | ICD-10-CM

## 2021-08-09 MED ORDER — ATENOLOL 50 MG PO TABS: 50.0000 mg | ORAL_TABLET | Freq: Every day | ORAL | 0 refills | Status: DC

## 2021-08-10 ENCOUNTER — Other Ambulatory Visit: Payer: Self-pay | Admitting: Family Medicine

## 2021-08-10 DIAGNOSIS — I129 Hypertensive chronic kidney disease with stage 1 through stage 4 chronic kidney disease, or unspecified chronic kidney disease: Secondary | ICD-10-CM

## 2021-08-11 ENCOUNTER — Other Ambulatory Visit: Payer: Self-pay | Admitting: Family Medicine

## 2021-08-11 DIAGNOSIS — I872 Venous insufficiency (chronic) (peripheral): Secondary | ICD-10-CM

## 2021-08-11 DIAGNOSIS — N183 Chronic kidney disease, stage 3 unspecified: Secondary | ICD-10-CM

## 2021-08-11 NOTE — Telephone Encounter (Signed)
Requested Prescriptions  Pending Prescriptions Disp Refills   hydrochlorothiazide (HYDRODIURIL) 25 MG tablet [Pharmacy Med Name: HYDROCHLOROTHIAZIDE 25 MG TAB] 90 tablet 0    Sig: Take 1 tablet (25 mg total) by mouth daily.     Cardiovascular: Diuretics - Thiazide Failed - 08/10/2021  2:39 PM      Failed - Cr in normal range and within 360 days    Creat  Date Value Ref Range Status  08/16/2020 1.98 (H) 0.70 - 1.11 mg/dL Final    Comment:    For patients >35 years of age, the reference limit for Creatinine is approximately 13% higher for people identified as African-American. .          Failed - Last BP in normal range    BP Readings from Last 1 Encounters:  07/21/21 (!) 154/73         Passed - Ca in normal range and within 360 days    Calcium  Date Value Ref Range Status  08/16/2020 9.8 8.6 - 10.3 mg/dL Final         Passed - K in normal range and within 360 days    Potassium  Date Value Ref Range Status  08/16/2020 4.5 3.5 - 5.3 mmol/L Final         Passed - Na in normal range and within 360 days    Sodium  Date Value Ref Range Status  08/16/2020 141 135 - 146 mmol/L Final         Passed - Valid encounter within last 6 months    Recent Outpatient Visits          3 weeks ago Chronic pain of right knee   [place], DO   5 months ago Chronic gout of right hand, unspecified cause   [place], DO   9 months ago Chronic pain of right knee   [facility] [place], Devonne Doughty, DO   1 year ago Benign hypertension with CKD (chronic kidney disease) stage III [facility])   [facility] Olin Hauser, DO   1 year ago Benign hypertension with CKD (chronic kidney disease) stage III   [place], Devonne Doughty, DO      Future Appointments            Tomorrow [place], Devonne Doughty, DO [facility], [facility]

## 2021-08-12 ENCOUNTER — Ambulatory Visit (INDEPENDENT_AMBULATORY_CARE_PROVIDER_SITE_OTHER): Payer: Medicare Other | Admitting: Family Medicine

## 2021-08-12 ENCOUNTER — Encounter: Payer: Self-pay | Admitting: Family Medicine

## 2021-08-12 VITALS — BP 135/82 | HR 84 | Ht 67.0 in | Wt 195.6 lb

## 2021-08-12 DIAGNOSIS — I272 Pulmonary hypertension, unspecified: Secondary | ICD-10-CM | POA: Diagnosis not present

## 2021-08-12 DIAGNOSIS — I4821 Permanent atrial fibrillation: Secondary | ICD-10-CM | POA: Diagnosis not present

## 2021-08-12 DIAGNOSIS — R918 Other nonspecific abnormal finding of lung field: Secondary | ICD-10-CM | POA: Diagnosis not present

## 2021-08-12 DIAGNOSIS — R0603 Acute respiratory distress: Secondary | ICD-10-CM | POA: Diagnosis not present

## 2021-08-12 DIAGNOSIS — I5033 Acute on chronic diastolic (congestive) heart failure: Secondary | ICD-10-CM | POA: Diagnosis not present

## 2021-08-12 DIAGNOSIS — N189 Chronic kidney disease, unspecified: Secondary | ICD-10-CM | POA: Diagnosis not present

## 2021-08-12 DIAGNOSIS — I872 Venous insufficiency (chronic) (peripheral): Secondary | ICD-10-CM

## 2021-08-12 DIAGNOSIS — I129 Hypertensive chronic kidney disease with stage 1 through stage 4 chronic kidney disease, or unspecified chronic kidney disease: Secondary | ICD-10-CM | POA: Diagnosis not present

## 2021-08-12 DIAGNOSIS — I509 Heart failure, unspecified: Secondary | ICD-10-CM | POA: Diagnosis not present

## 2021-08-12 DIAGNOSIS — Z8616 Personal history of COVID-19: Secondary | ICD-10-CM | POA: Diagnosis not present

## 2021-08-12 DIAGNOSIS — R0902 Hypoxemia: Secondary | ICD-10-CM | POA: Diagnosis not present

## 2021-08-12 DIAGNOSIS — N183 Chronic kidney disease, stage 3 unspecified: Secondary | ICD-10-CM | POA: Diagnosis not present

## 2021-08-12 DIAGNOSIS — Z87891 Personal history of nicotine dependence: Secondary | ICD-10-CM | POA: Diagnosis not present

## 2021-08-12 DIAGNOSIS — J9601 Acute respiratory failure with hypoxia: Secondary | ICD-10-CM | POA: Diagnosis not present

## 2021-08-12 DIAGNOSIS — J90 Pleural effusion, not elsewhere classified: Secondary | ICD-10-CM | POA: Diagnosis not present

## 2021-08-12 DIAGNOSIS — M109 Gout, unspecified: Secondary | ICD-10-CM | POA: Diagnosis not present

## 2021-08-12 DIAGNOSIS — E8770 Fluid overload, unspecified: Secondary | ICD-10-CM | POA: Diagnosis not present

## 2021-08-12 DIAGNOSIS — R161 Splenomegaly, not elsewhere classified: Secondary | ICD-10-CM | POA: Diagnosis not present

## 2021-08-12 DIAGNOSIS — E785 Hyperlipidemia, unspecified: Secondary | ICD-10-CM | POA: Diagnosis not present

## 2021-08-12 DIAGNOSIS — I10 Essential (primary) hypertension: Secondary | ICD-10-CM | POA: Diagnosis not present

## 2021-08-12 DIAGNOSIS — I482 Chronic atrial fibrillation, unspecified: Secondary | ICD-10-CM | POA: Diagnosis not present

## 2021-08-12 DIAGNOSIS — I13 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 failure and stage 1 through stage 4 chronic kidney disease, or unspecified chronic kidney disease: Secondary | ICD-10-CM | POA: Diagnosis not present

## 2021-08-12 DIAGNOSIS — M7989 Other specified soft tissue disorders: Secondary | ICD-10-CM | POA: Diagnosis not present

## 2021-08-12 DIAGNOSIS — Z66 Do not resuscitate: Secondary | ICD-10-CM | POA: Diagnosis not present

## 2021-08-12 DIAGNOSIS — N179 Acute kidney failure, unspecified: Secondary | ICD-10-CM | POA: Diagnosis not present

## 2021-08-12 DIAGNOSIS — R0602 Shortness of breath: Secondary | ICD-10-CM | POA: Diagnosis not present

## 2021-08-12 DIAGNOSIS — I2510 Atherosclerotic heart disease of native coronary artery without angina pectoris: Secondary | ICD-10-CM | POA: Diagnosis not present

## 2021-08-12 DIAGNOSIS — R0609 Other forms of dyspnea: Secondary | ICD-10-CM

## 2021-08-12 DIAGNOSIS — R591 Generalized enlarged lymph nodes: Secondary | ICD-10-CM | POA: Diagnosis not present

## 2021-08-12 DIAGNOSIS — I1310 Hypertensive heart and chronic kidney disease without heart failure, with stage 1 through stage 4 chronic kidney disease, or unspecified chronic kidney disease: Secondary | ICD-10-CM | POA: Diagnosis not present

## 2021-08-12 DIAGNOSIS — Z20822 Contact with and (suspected) exposure to covid-19: Secondary | ICD-10-CM | POA: Diagnosis not present

## 2021-08-12 DIAGNOSIS — J8410 Pulmonary fibrosis, unspecified: Secondary | ICD-10-CM | POA: Diagnosis not present

## 2021-08-12 DIAGNOSIS — I517 Cardiomegaly: Secondary | ICD-10-CM | POA: Diagnosis not present

## 2021-08-12 DIAGNOSIS — Z515 Encounter for palliative care: Secondary | ICD-10-CM | POA: Diagnosis not present

## 2021-08-12 DIAGNOSIS — J811 Chronic pulmonary edema: Secondary | ICD-10-CM | POA: Diagnosis not present

## 2021-08-12 DIAGNOSIS — C9510 Chronic leukemia of unspecified cell type not having achieved remission: Secondary | ICD-10-CM | POA: Diagnosis not present

## 2021-08-12 DIAGNOSIS — C9110 Chronic lymphocytic leukemia of B-cell type not having achieved remission: Secondary | ICD-10-CM | POA: Diagnosis not present

## 2021-08-12 DIAGNOSIS — Z7901 Long term (current) use of anticoagulants: Secondary | ICD-10-CM | POA: Diagnosis not present

## 2021-08-12 DIAGNOSIS — I4891 Unspecified atrial fibrillation: Secondary | ICD-10-CM | POA: Diagnosis not present

## 2021-08-12 DIAGNOSIS — I5031 Acute diastolic (congestive) heart failure: Secondary | ICD-10-CM | POA: Diagnosis not present

## 2021-08-12 MED ORDER — APIXABAN 2.5 MG PO TABS: 2.5000 mg | ORAL_TABLET | Freq: Two times a day (BID) | ORAL | 3 refills | Status: DC

## 2021-08-12 MED ORDER — FUROSEMIDE 40 MG PO TABS: 40.0000 mg | ORAL_TABLET | Freq: Every day | ORAL | 1 refills | Status: DC | PRN

## 2021-08-12 NOTE — Progress Notes (Signed)
Subjective:    Patient ID: Robert [name], male    DOB: Nov 12, 1931, 88 y.o.   MRN: [id number]  Robert [name] is a 88 y.o. male presenting on 08/12/2021 for Shortness of Breath   HPI  Dyspnea on Exertion / Hypoxia Edema Recent issue with reported worsening dyspnea with exertion. He normally takes diuretic but was out for period of time, now back on for past 2 days, still swelling in legs, less mobile due to dyspnea. Not having wheezing or coughing. On other meds including Atenolol, HCTZ No Current Cardiologist. No recent ECHOcardiogram. Hx Paroxysmal AFib on Eliquis Denies chest pain or pressure. Denies fever chills nausea vomiting headaches    Depression screen [facility] 2/9 03/10/2021 07/06/2020 10/29/2019  Decreased Interest 0 0 0  Down, Depressed, Hopeless 0 0 0  PHQ - 2 Score 0 0 0  Altered sleeping 0 - -  Tired, decreased energy 0 - -  Change in appetite 0 - -  Feeling bad or failure about yourself  0 - -  Trouble concentrating 0 - -  Moving slowly or fidgety/restless 0 - -  Suicidal thoughts 0 - -  PHQ-9 Score 0 - -  Difficult doing work/chores Not difficult at all - -    Social History   Tobacco Use   Smoking status: Former    Types: Cigarettes    Quit date: 1985    Years since quitting: 38.0   Smokeless tobacco: Former  Scientific laboratory technician Use: Never used  Substance Use Topics   Alcohol use: Never   Drug use: Never    Review of Systems Per HPI unless specifically indicated above     Objective:    BP 135/82    Pulse 84    Ht 5\' 7"  (1.702 m)    Wt 195 lb 9.6 oz (88.7 kg)    SpO2 (!) 85%    BMI 30.64 kg/m   Wt Readings from Last 3 Encounters:  08/12/21 195 lb 9.6 oz (88.7 kg)  07/21/21 192 lb (87.1 kg)  03/10/21 187 lb 12.8 oz (85.2 kg)    Physical Exam Vitals and nursing note reviewed.  Constitutional:      General: He is not in acute distress.    Appearance: He is well-developed. He is not diaphoretic.     Comments: Well-appearing, mostly comfortable but  some increased work of breathing, cooperative  HENT:     Head: Normocephalic and atraumatic.  Eyes:     General:        Right eye: No discharge.        Left eye: No discharge.     Conjunctiva/sclera: Conjunctivae normal.  Neck:     Thyroid: No thyromegaly.  Cardiovascular:     Rate and Rhythm: Normal rate and regular rhythm.     Pulses: Normal pulses.     Heart sounds: Normal heart sounds. No murmur heard. Pulmonary:     Effort: No respiratory distress.     Breath sounds: Examination of the right-lower field reveals decreased breath sounds. Examination of the left-lower field reveals decreased breath sounds. Decreased breath sounds present. No wheezing or rales.     Comments: Inc work of breathing Musculoskeletal:        General: Normal range of motion.     Cervical back: Normal range of motion and neck supple.     Right lower leg: Edema present.     Left lower leg: Edema present.     Comments: wheelchair  Lymphadenopathy:  Cervical: No cervical adenopathy.  Skin:    General: Skin is warm and dry.     Findings: No erythema or rash.  Neurological:     Mental Status: He is alert and oriented to person, place, and time. Mental status is at baseline.  Psychiatric:        Behavior: Behavior normal.     Comments: Well groomed, good eye contact, normal speech and thoughts   Results for orders placed or performed in visit on 86/76/19  BASIC METABOLIC PANEL WITH GFR  Result Value Ref Range   Glucose, Bld 98 65 - 99 mg/dL   BUN 40 (H) 7 - 25 mg/dL   Creat 1.98 (H) 0.70 - 1.11 mg/dL   GFR, Est Non African American 30 (L) > OR = 60 mL/min/1.18m2   GFR, Est African American 34 (L) > OR = 60 mL/min/1.66m2   BUN/Creatinine Ratio 20 6 - 22 (calc)   Sodium 141 135 - 146 mmol/L   Potassium 4.5 3.5 - 5.3 mmol/L   Chloride 107 98 - 110 mmol/L   CO2 26 20 - 32 mmol/L   Calcium 9.8 8.6 - 10.3 mg/dL      Assessment & Plan:   Problem List Items Addressed This Visit     Chronic  venous insufficiency   Relevant Medications   apixaban (ELIQUIS) 2.5 MG TABS tablet   furosemide (LASIX) 40 MG tablet   Benign hypertension with CKD (chronic kidney disease) stage III (HCC)   Relevant Medications   apixaban (ELIQUIS) 2.5 MG TABS tablet   furosemide (LASIX) 40 MG tablet   Atrial fibrillation (HCC)   Relevant Medications   apixaban (ELIQUIS) 2.5 MG TABS tablet   furosemide (LASIX) 40 MG tablet   Other Visit Diagnoses     Dyspnea on exertion    -  Primary   Hypoxia           Dyspnea on Exertion Lower Ext Edema Hypoxia - some inaccuracy with measure initially ranging in low 60s, repeat up to 70-80s, multiple attempts. However otherwise hemodynamically stable. No access to CXR in office today  He will need imaging and further eval given concern w/ potential hypoxia Some reduced air movement and inc work of breathing, but still speaks full sentences and appears mostly comfortable Family already arranged to take him directly to ED now by personal vehicle, they [facility] ED. They are choosing to take him directly.  He was out of furosemide for period of time, has restarted. He may need IV diuresis.  Will benefit from further imaging CXR vs CT  Future will need ECHO / Cards as well.  Follow-up after ED  I called [facility] and spoke with triage and provided his history.  Meds ordered this encounter  Medications   apixaban (ELIQUIS) 2.5 MG TABS tablet    Sig: Take 1 tablet (2.5 mg total) by mouth 2 (two) times daily.    Dispense:  180 tablet    Refill:  3    90 day   furosemide (LASIX) 40 MG tablet    Sig: Take 1 tablet (40 mg total) by mouth daily as needed for fluid or edema.    Dispense:  90 tablet    Refill:  1    90 day   order 90 day meds eliquis lasix   Follow up plan: Return if symptoms worsen or fail to improve.   Nobie Putnam, [facility] 08/12/2021, 9:12  AM

## 2021-08-12 NOTE — Patient Instructions (Addendum)
Thank you for coming to the office today.  Low Oxygen on our measurement today. I am worried that shortness of breath is due to fluid. May need stronger IV fluid medicine   Will need imaging x-ray   Please schedule a Follow-up Appointment to: Return if symptoms worsen or fail to improve.  If you have any other questions or concerns, please feel free to call the office or send a message through [place]. You may also schedule an earlier appointment if necessary.  Additionally, you may be receiving a survey about your experience at our office within a few days to 1 week by e-mail or mail. We value your feedback.  Nobie Putnam, DO [place]

## 2021-08-12 NOTE — Telephone Encounter (Signed)
Requested Prescriptions  Pending Prescriptions Disp Refills   furosemide (LASIX) 40 MG tablet [Pharmacy Med Name: FUROSEMIDE 40 MG TABLET] 30 tablet 0    Sig: Take 0.5-1 tablets (20-40 mg total) by mouth daily as needed for fluid or edema.     Cardiovascular:  Diuretics - Loop Failed - 08/11/2021  1:52 PM      Failed - Cr in normal range and within 360 days    Creat  Date Value Ref Range Status  08/16/2020 1.98 (H) 0.70 - 1.11 mg/dL Final    Comment:    For patients >57 years of age, the reference limit for Creatinine is approximately 13% higher for people identified as African-American. .          Failed - Last BP in normal range    BP Readings from Last 1 Encounters:  07/21/21 (!) 154/73         Passed - K in normal range and within 360 days    Potassium  Date Value Ref Range Status  08/16/2020 4.5 3.5 - 5.3 mmol/L Final         Passed - Ca in normal range and within 360 days    Calcium  Date Value Ref Range Status  08/16/2020 9.8 8.6 - 10.3 mg/dL Final         Passed - Na in normal range and within 360 days    Sodium  Date Value Ref Range Status  08/16/2020 141 135 - 146 mmol/L Final         Passed - Valid encounter within last 6 months    Recent Outpatient Visits          3 weeks ago Chronic pain of right knee   [place], DO   5 months ago Chronic gout of right hand, unspecified cause   [place], DO   9 months ago Chronic pain of right knee   [facility], Devonne Doughty, DO   1 year ago Benign hypertension with CKD (chronic kidney disease) stage III [facility])   [place], DO   1 year ago Benign hypertension with CKD (chronic kidney disease) stage III   [place], Devonne Doughty, DO      Future Appointments            Today [place], Devonne Doughty, DO [facility],  [facility]

## 2021-08-13 DIAGNOSIS — R0603 Acute respiratory distress: Secondary | ICD-10-CM | POA: Diagnosis not present

## 2021-08-14 DIAGNOSIS — I10 Essential (primary) hypertension: Secondary | ICD-10-CM | POA: Diagnosis not present

## 2021-08-14 DIAGNOSIS — M109 Gout, unspecified: Secondary | ICD-10-CM | POA: Diagnosis not present

## 2021-08-14 DIAGNOSIS — C9110 Chronic lymphocytic leukemia of B-cell type not having achieved remission: Secondary | ICD-10-CM | POA: Diagnosis not present

## 2021-08-14 DIAGNOSIS — J9601 Acute respiratory failure with hypoxia: Secondary | ICD-10-CM | POA: Diagnosis not present

## 2021-08-14 DIAGNOSIS — I4891 Unspecified atrial fibrillation: Secondary | ICD-10-CM | POA: Diagnosis not present

## 2021-08-14 DIAGNOSIS — N183 Chronic kidney disease, stage 3 unspecified: Secondary | ICD-10-CM | POA: Diagnosis not present

## 2021-08-15 ENCOUNTER — Telehealth: Payer: Self-pay | Admitting: Family Medicine

## 2021-08-15 DIAGNOSIS — I517 Cardiomegaly: Secondary | ICD-10-CM | POA: Diagnosis not present

## 2021-08-15 DIAGNOSIS — I5033 Acute on chronic diastolic (congestive) heart failure: Secondary | ICD-10-CM

## 2021-08-15 DIAGNOSIS — E8770 Fluid overload, unspecified: Secondary | ICD-10-CM | POA: Diagnosis not present

## 2021-08-15 DIAGNOSIS — G8929 Other chronic pain: Secondary | ICD-10-CM

## 2021-08-15 DIAGNOSIS — M1711 Unilateral primary osteoarthritis, right knee: Secondary | ICD-10-CM

## 2021-08-15 DIAGNOSIS — I272 Pulmonary hypertension, unspecified: Secondary | ICD-10-CM | POA: Diagnosis not present

## 2021-08-15 DIAGNOSIS — J9601 Acute respiratory failure with hypoxia: Secondary | ICD-10-CM | POA: Diagnosis not present

## 2021-08-15 DIAGNOSIS — Z8616 Personal history of COVID-19: Secondary | ICD-10-CM | POA: Diagnosis not present

## 2021-08-15 DIAGNOSIS — I4891 Unspecified atrial fibrillation: Secondary | ICD-10-CM | POA: Diagnosis not present

## 2021-08-15 DIAGNOSIS — N183 Chronic kidney disease, stage 3 unspecified: Secondary | ICD-10-CM | POA: Diagnosis not present

## 2021-08-15 DIAGNOSIS — R0902 Hypoxemia: Secondary | ICD-10-CM | POA: Diagnosis not present

## 2021-08-15 NOTE — Telephone Encounter (Signed)
Eric with [facility] called to find out that Dr. Raliegh Ip would be ok to with them to do OT and PT. Home Health.  They will be faxing an order but wanted to ask first if that was ok.  CB#  [phone]

## 2021-08-16 DIAGNOSIS — J9601 Acute respiratory failure with hypoxia: Secondary | ICD-10-CM | POA: Diagnosis not present

## 2021-08-16 DIAGNOSIS — Z515 Encounter for palliative care: Secondary | ICD-10-CM | POA: Diagnosis not present

## 2021-08-16 DIAGNOSIS — E8770 Fluid overload, unspecified: Secondary | ICD-10-CM | POA: Diagnosis not present

## 2021-08-16 DIAGNOSIS — I4891 Unspecified atrial fibrillation: Secondary | ICD-10-CM | POA: Diagnosis not present

## 2021-08-16 DIAGNOSIS — I272 Pulmonary hypertension, unspecified: Secondary | ICD-10-CM | POA: Diagnosis not present

## 2021-08-16 DIAGNOSIS — I10 Essential (primary) hypertension: Secondary | ICD-10-CM | POA: Diagnosis not present

## 2021-08-16 NOTE — Telephone Encounter (Signed)
Eric from [facility] called wanted to at [place] and Pt will start week of 01/17

## 2021-08-16 NOTE — Telephone Encounter (Signed)
Sure that is fine. Thank you!  Robert [name], [facility] 08/16/2021, 9:41 AM

## 2021-08-16 NOTE — Telephone Encounter (Signed)
Spoke to [place] told him Dr. Raliegh Ip said it was ok to do OT, PT, Home Health. He verbalized understanding.  KP

## 2021-08-16 NOTE — Telephone Encounter (Signed)
Please review.  KP

## 2021-08-17 DIAGNOSIS — N179 Acute kidney failure, unspecified: Secondary | ICD-10-CM | POA: Diagnosis not present

## 2021-08-17 DIAGNOSIS — I13 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 failure and stage 1 through stage 4 chronic kidney disease, or unspecified chronic kidney disease: Secondary | ICD-10-CM | POA: Diagnosis not present

## 2021-08-17 DIAGNOSIS — I4891 Unspecified atrial fibrillation: Secondary | ICD-10-CM | POA: Diagnosis not present

## 2021-08-17 DIAGNOSIS — N183 Chronic kidney disease, stage 3 unspecified: Secondary | ICD-10-CM | POA: Diagnosis not present

## 2021-08-17 DIAGNOSIS — I5031 Acute diastolic (congestive) heart failure: Secondary | ICD-10-CM | POA: Diagnosis not present

## 2021-08-18 DIAGNOSIS — I4891 Unspecified atrial fibrillation: Secondary | ICD-10-CM | POA: Diagnosis not present

## 2021-08-18 DIAGNOSIS — I13 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 failure and stage 1 through stage 4 chronic kidney disease, or unspecified chronic kidney disease: Secondary | ICD-10-CM | POA: Diagnosis not present

## 2021-08-18 DIAGNOSIS — N179 Acute kidney failure, unspecified: Secondary | ICD-10-CM | POA: Diagnosis not present

## 2021-08-18 DIAGNOSIS — I5031 Acute diastolic (congestive) heart failure: Secondary | ICD-10-CM | POA: Diagnosis not present

## 2021-08-18 DIAGNOSIS — N183 Chronic kidney disease, stage 3 unspecified: Secondary | ICD-10-CM | POA: Diagnosis not present

## 2021-08-19 DIAGNOSIS — I4891 Unspecified atrial fibrillation: Secondary | ICD-10-CM | POA: Diagnosis not present

## 2021-08-19 DIAGNOSIS — C9510 Chronic leukemia of unspecified cell type not having achieved remission: Secondary | ICD-10-CM | POA: Diagnosis not present

## 2021-08-19 DIAGNOSIS — J8410 Pulmonary fibrosis, unspecified: Secondary | ICD-10-CM | POA: Diagnosis not present

## 2021-08-19 DIAGNOSIS — N183 Chronic kidney disease, stage 3 unspecified: Secondary | ICD-10-CM | POA: Diagnosis not present

## 2021-08-19 DIAGNOSIS — J9601 Acute respiratory failure with hypoxia: Secondary | ICD-10-CM | POA: Diagnosis not present

## 2021-08-19 DIAGNOSIS — I10 Essential (primary) hypertension: Secondary | ICD-10-CM | POA: Diagnosis not present

## 2021-08-22 NOTE — Telephone Encounter (Signed)
Robert [name] with [facility] is calling because approval for orders was given. However, the patient does not live in [place] he lives in [place]. [facility] does not cover the area of [facility].  Robert [name] reports that the new  address of the patient:  [place], [place].   Order was given for OT & PT. Pt is staying with his granddaughter. Robert [name] did [employer]. [facility] does cover the [facility] area. Robert [name], acct executive is willing to accept the patient [phone]  Robert [name]- [phone]  Callback for the pt granddaughter-[phone] Pts Brother--[phone]

## 2021-08-22 NOTE — Telephone Encounter (Signed)
Lexine Baton - do you mind following up with this request? Sounds like needs a change of provider to [facility] for Home Health OT PT? Can use previous referral or let me know if need anything else.  Nobie Putnam, [facility] 08/22/2021, 5:37 PM

## 2021-08-23 ENCOUNTER — Ambulatory Visit (INDEPENDENT_AMBULATORY_CARE_PROVIDER_SITE_OTHER): Payer: Medicare Other | Admitting: Family Medicine

## 2021-08-23 ENCOUNTER — Encounter: Payer: Self-pay | Admitting: Family Medicine

## 2021-08-23 ENCOUNTER — Other Ambulatory Visit: Payer: Self-pay

## 2021-08-23 VITALS — BP 142/68 | HR 64 | Ht 67.0 in | Wt 195.0 lb

## 2021-08-23 DIAGNOSIS — R0609 Other forms of dyspnea: Secondary | ICD-10-CM

## 2021-08-23 DIAGNOSIS — I129 Hypertensive chronic kidney disease with stage 1 through stage 4 chronic kidney disease, or unspecified chronic kidney disease: Secondary | ICD-10-CM | POA: Diagnosis not present

## 2021-08-23 DIAGNOSIS — N183 Chronic kidney disease, stage 3 unspecified: Secondary | ICD-10-CM

## 2021-08-23 DIAGNOSIS — I5031 Acute diastolic (congestive) heart failure: Secondary | ICD-10-CM

## 2021-08-23 LAB — CBC WITH DIFFERENTIAL/PLATELET
Absolute Monocytes: 1717 cells/uL — ABNORMAL HIGH (ref 200–950)
Basophils Absolute: 143 cells/uL (ref 0–200)
Basophils Relative: 0.9 %
Eosinophils Absolute: 254 cells/uL (ref 15–500)
Eosinophils Relative: 1.6 %
HCT: 42.9 % (ref 38.5–50.0)
Hemoglobin: 13.3 g/dL (ref 13.2–17.1)
Lymphs Abs: 8888 cells/uL — ABNORMAL HIGH (ref 850–3900)
MCH: 24.5 pg — ABNORMAL LOW (ref 27.0–33.0)
MCHC: 31 g/dL — ABNORMAL LOW (ref 32.0–36.0)
MCV: 79 fL — ABNORMAL LOW (ref 80.0–100.0)
Monocytes Relative: 10.8 %
Neutro Abs: 4897 cells/uL (ref 1500–7800)
Neutrophils Relative %: 30.8 %
Platelets: 204 10*3/uL (ref 140–400)
RBC: 5.43 10*6/uL (ref 4.20–5.80)
RDW: 17.1 % — ABNORMAL HIGH (ref 11.0–15.0)
Total Lymphocyte: 55.9 %
WBC: 15.9 10*3/uL — ABNORMAL HIGH (ref 3.8–10.8)

## 2021-08-23 LAB — BASIC METABOLIC PANEL WITH GFR
BUN/Creatinine Ratio: 26 (calc) — ABNORMAL HIGH (ref 6–22)
BUN: 42 mg/dL — ABNORMAL HIGH (ref 7–25)
CO2: 29 mmol/L (ref 20–32)
Calcium: 9.9 mg/dL (ref 8.6–10.3)
Chloride: 105 mmol/L (ref 98–110)
Creat: 1.63 mg/dL — ABNORMAL HIGH (ref 0.70–1.22)
Glucose, Bld: 88 mg/dL (ref 65–139)
Potassium: 5.3 mmol/L (ref 3.5–5.3)
Sodium: 139 mmol/L (ref 135–146)
eGFR: 41 mL/min/{1.73_m2} — ABNORMAL LOW (ref >=60)

## 2021-08-23 LAB — MAGNESIUM: Magnesium: 2.1 mg/dL (ref 1.5–2.5)

## 2021-08-23 MED ORDER — METOPROLOL SUCCINATE ER 25 MG PO TB24: 25.0000 mg | ORAL_TABLET | Freq: Every day | ORAL | 3 refills | Status: DC

## 2021-08-23 NOTE — Progress Notes (Signed)
Subjective:    Patient ID: Robert [name], male    DOB: 1932-01-27, 88 y.o.   MRN: [id number]  Robert [name] is a 88 y.o. male presenting on 08/23/2021 for Hospitalization Follow-up   HPI  HOSPITAL FOLLOW-UP VISIT  Hospital/Location: [facility] Date of Admission: 08/12/21 Date of Discharge: 08/19/21 Transitions of care telephone call: Completed on 08/20/21  Reason for Admission: Dyspnea, Hypoxia  - Hospital H&P and Discharge Summary have been reviewed - Patient presents today 4 days after recent hospitalization. Brief summary of recent course, patient had symptoms of dyspnea, hospitalized after visit here on 08/12/21 sent to ED. Treated with supplemental oxygen, diuresis, significant amt of fluid weight lost due to diuresis with good results. Had AKI as well with kidney dysfunction needs labs today  Discharged with [facility] PT OT - however the company does not serve their home area, and they will need it switched to [facility], she will check to see if this can be changed from the [facility] hospital on discharge.  - Today reports overall has done well after discharge. Symptoms of dyspnea have resolved now on home oxygen and edema is much better.  He needs labs today and to readdress BP meds  He follows with [facility] with [facility] they asked to follow-up.  - New medications on discharge: None - Changes to current meds on discharge: Stopped Furosemide, Atenolol. HELD Losartan, HCTZ.  I have reviewed the discharge medication list, and have reconciled the current and discharge medications today.   Current Outpatient Medications:    allopurinol (ZYLOPRIM) 100 MG tablet, Take 1 tablet (100 mg total) by mouth daily., Disp: 90 tablet, Rfl: 1   apixaban (ELIQUIS) 2.5 MG TABS tablet, Take 1 tablet (2.5 mg total) by mouth 2 (two) times daily., Disp: 180 tablet, Rfl: 3   diclofenac Sodium (VOLTAREN) 1 % GEL, Apply 2 g topically 4 (four) times daily as needed (right knee arthritis pain)., Disp: 100 g, Rfl:  5   fluticasone (FLONASE) 50 MCG/ACT nasal spray, Place 2 sprays into both nostrils daily. Use for 4-6 weeks then stop and use seasonally or as needed., Disp: 16 g, Rfl: 3   furosemide (LASIX) 40 MG tablet, Take 1 tablet (40 mg total) by mouth daily as needed for fluid or edema., Disp: 90 tablet, Rfl: 1   gabapentin (NEURONTIN) 100 MG capsule, Start 1 capsule daily, increase by 1 cap every 2-3 days as tolerated up to 3 times a day, or may take 3 at once in evening., Disp: 90 capsule, Rfl: 1   hydrochlorothiazide (HYDRODIURIL) 25 MG tablet, Take 1 tablet (25 mg total) by mouth daily., Disp: 90 tablet, Rfl: 0   Melatonin 10 MG TABS, Take 10 mg by mouth at bedtime., Disp: , Rfl:    metoprolol succinate (TOPROL-XL) 25 MG 24 hr tablet, Take 1 tablet (25 mg total) by mouth daily., Disp: 90 tablet, Rfl: 3  ------------------------------------------------------------------------- Social History   Tobacco Use   Smoking status: Former    Types: Cigarettes    Quit date: 1985    Years since quitting: 38.0   Smokeless tobacco: Former  Scientific laboratory technician Use: Never used  Substance Use Topics   Alcohol use: Never   Drug use: Never    Review of Systems Per HPI unless specifically indicated above     Objective:    BP (!) 142/68    Pulse 64    Ht 5\' 7"  (1.702 m)    Wt 195 lb (88.5 kg)  SpO2 99%    BMI 30.54 kg/m   Wt Readings from Last 3 Encounters:  08/23/21 195 lb (88.5 kg)  08/12/21 195 lb 9.6 oz (88.7 kg)  07/21/21 192 lb (87.1 kg)    Physical Exam Vitals and nursing note reviewed.  Constitutional:      General: He is not in acute distress.    Appearance: He is well-developed. He is not diaphoretic.     Comments: Well-appearing, comfortable, cooperative  HENT:     Head: Normocephalic and atraumatic.  Eyes:     General:        Right eye: No discharge.        Left eye: No discharge.     Conjunctiva/sclera: Conjunctivae normal.  Neck:     Thyroid: No thyromegaly.   Cardiovascular:     Rate and Rhythm: Normal rate and regular rhythm.     Pulses: Normal pulses.     Heart sounds: Normal heart sounds. No murmur heard. Pulmonary:     Effort: Pulmonary effort is normal. No respiratory distress.     Breath sounds: Normal breath sounds. No wheezing or rales.     Comments: Supplemental oxygen on today Musculoskeletal:        General: Normal range of motion.     Cervical back: Normal range of motion and neck supple.     Right lower leg: No edema.     Left lower leg: No edema (improved edema near baseline now).  Lymphadenopathy:     Cervical: No cervical adenopathy.  Skin:    General: Skin is warm and dry.     Findings: No erythema or rash.  Neurological:     Mental Status: He is alert and oriented to person, place, and time. Mental status is at baseline.  Psychiatric:        Behavior: Behavior normal.     Comments: Well groomed, good eye contact, normal speech and thoughts      Results for orders placed or performed in visit on 28/78/67  BASIC METABOLIC PANEL WITH GFR  Result Value Ref Range   Glucose, Bld 98 65 - 99 mg/dL   BUN 40 (H) 7 - 25 mg/dL   Creat 1.98 (H) 0.70 - 1.11 mg/dL   GFR, Est Non African American 30 (L) > OR = 60 mL/min/1.65m2   GFR, Est African American 34 (L) > OR = 60 mL/min/1.64m2   BUN/Creatinine Ratio 20 6 - 22 (calc)   Sodium 141 135 - 146 mmol/L   Potassium 4.5 3.5 - 5.3 mmol/L   Chloride 107 98 - 110 mmol/L   CO2 26 20 - 32 mmol/L   Calcium 9.8 8.6 - 10.3 mg/dL      Assessment & Plan:   Problem List Items Addressed This Visit     Benign hypertension with CKD (chronic kidney disease) stage III (HCC)   Relevant Medications   metoprolol succinate (TOPROL-XL) 25 MG 24 hr tablet   Other Relevant Orders   BASIC METABOLIC PANEL WITH GFR   CBC with Differential/Platelet   Magnesium   Other Visit Diagnoses     Acute heart failure with preserved ejection fraction (HFpEF) (HCC)    -  Primary   Relevant  Medications   metoprolol succinate (TOPROL-XL) 25 MG 24 hr tablet   Other Relevant Orders   BASIC METABOLIC PANEL WITH GFR   CBC with Differential/Platelet   Magnesium   Dyspnea on exertion           Dramatic improvement  now here for [facility] discharge summary and to do list for outpatient f/u  HFpEF acute resolved  Check labs today as requested BMET CBC magnesium today.  Based on Cr will restart or adjust HTN meds  Still holding Losartan 25mg  and HCTZ 25mg  daily for now we can advise them to restart based on labs.  Hold Furosemide now that euvolemic volume down and improved.  DC Atenolol. New order Metoprolol XL 25mg  daily but can add back once we figure out other BP meds.  Wean O2 as instructed, has supply currently  Advised to change HH orders to [facility], they will contact [facility] who arranged [place] orders first  If they cannot change then we can try to help  Orders Placed This Encounter  Procedures   BASIC METABOLIC PANEL WITH GFR   CBC with Differential/Platelet   Magnesium     Meds ordered this encounter  Medications   metoprolol succinate (TOPROL-XL) 25 MG 24 hr tablet    Sig: Take 1 tablet (25 mg total) by mouth daily.    Dispense:  90 tablet    Refill:  3    Follow up plan: Return if symptoms worsen or fail to improve.  Nobie Putnam, [facility] 08/23/2021, 11:20 AM

## 2021-08-23 NOTE — Patient Instructions (Addendum)
Thank you for coming to the office today.  Labs today  If Kidney function improved and back to baseline, we can plan on the following:  Resume Losartan 25mg  daily Resume HCTZ 25mg  daily Stop the Furosemide and use ONLY IF SWELLING / Or difficulty breathing.  Stop Atenolol Wait 1 week then restart Metoprolol XL 25mg  daily new order placed  Gradually wean down oxygen over 2-4 weeks.  For home health, need to switch from "[facility]" to [facility] - due to the home location.  The hospital discharges with orders to make this happen, we want to continue those orders but just change location.  See if Randall Hiss can help, if not, then will need to contact [facility] to get the location changed.  [address], [address], [place], [place], [place] [address]   [phone]   Aurelio Brash R   Case Management   [facility]     Or can call main hospital # for [facility] to see if they can change the Home Health order LOCATION to [facility], try to do this ASAP if cannot get it organized today.  Last resort, if no one can solve it, and you still need home health PT - let me know and I can place an order, but may take longer.  You will need to make sure a [facility] ambulatory referral was set up, if they did not or cannot schedule or you need to be seen at [facility] locally in [place] then let me know I can place new referral   Please schedule a Follow-up Appointment to: Return if symptoms worsen or fail to improve.  If you have any other questions or concerns, please feel free to call the office or send a message through [place]. You may also schedule an earlier appointment if necessary.  Additionally, you may be receiving a survey about your experience at our office within a few days to 1 week by e-mail or mail. We value your feedback.  Robert Putnam, DO [place]

## 2021-08-25 DIAGNOSIS — I5030 Unspecified diastolic (congestive) heart failure: Secondary | ICD-10-CM | POA: Insufficient documentation

## 2021-08-25 DIAGNOSIS — I5033 Acute on chronic diastolic (congestive) heart failure: Secondary | ICD-10-CM | POA: Insufficient documentation

## 2021-08-25 NOTE — Telephone Encounter (Signed)
There was no original order as it was done by hospital I believe.  I have placed a new order now  Nobie Putnam, [facility] 08/25/2021, 10:44 AM

## 2021-08-25 NOTE — Addendum Note (Signed)
Addended by: Olin Hauser on: 08/25/2021 10:44 AM   Modules accepted: Orders

## 2021-08-26 DIAGNOSIS — I5031 Acute diastolic (congestive) heart failure: Secondary | ICD-10-CM | POA: Insufficient documentation

## 2021-08-26 DIAGNOSIS — R2681 Unsteadiness on feet: Secondary | ICD-10-CM | POA: Insufficient documentation

## 2021-08-26 DIAGNOSIS — M6281 Muscle weakness (generalized): Secondary | ICD-10-CM | POA: Insufficient documentation

## 2021-08-26 DIAGNOSIS — I13 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 failure and stage 1 through stage 4 chronic kidney disease, or unspecified chronic kidney disease: Secondary | ICD-10-CM | POA: Insufficient documentation

## 2021-08-26 DIAGNOSIS — Z9981 Dependence on supplemental oxygen: Secondary | ICD-10-CM | POA: Insufficient documentation

## 2021-08-26 DIAGNOSIS — N183 Chronic kidney disease, stage 3 unspecified: Secondary | ICD-10-CM | POA: Insufficient documentation

## 2021-08-30 DIAGNOSIS — M6281 Muscle weakness (generalized): Secondary | ICD-10-CM | POA: Diagnosis not present

## 2021-08-30 DIAGNOSIS — Z9981 Dependence on supplemental oxygen: Secondary | ICD-10-CM | POA: Diagnosis not present

## 2021-08-30 DIAGNOSIS — I13 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 failure and stage 1 through stage 4 chronic kidney disease, or unspecified chronic kidney disease: Secondary | ICD-10-CM | POA: Diagnosis not present

## 2021-08-30 DIAGNOSIS — N183 Chronic kidney disease, stage 3 unspecified: Secondary | ICD-10-CM | POA: Diagnosis not present

## 2021-08-30 DIAGNOSIS — R2681 Unsteadiness on feet: Secondary | ICD-10-CM | POA: Diagnosis not present

## 2021-08-30 DIAGNOSIS — Z7901 Long term (current) use of anticoagulants: Secondary | ICD-10-CM | POA: Diagnosis not present

## 2021-08-30 DIAGNOSIS — I5031 Acute diastolic (congestive) heart failure: Secondary | ICD-10-CM | POA: Diagnosis not present

## 2021-08-30 DIAGNOSIS — Z87891 Personal history of nicotine dependence: Secondary | ICD-10-CM | POA: Diagnosis not present

## 2021-09-02 DIAGNOSIS — I13 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 failure and stage 1 through stage 4 chronic kidney disease, or unspecified chronic kidney disease: Secondary | ICD-10-CM | POA: Diagnosis not present

## 2021-09-02 DIAGNOSIS — R2681 Unsteadiness on feet: Secondary | ICD-10-CM | POA: Diagnosis not present

## 2021-09-02 DIAGNOSIS — M6281 Muscle weakness (generalized): Secondary | ICD-10-CM | POA: Diagnosis not present

## 2021-09-02 DIAGNOSIS — N183 Chronic kidney disease, stage 3 unspecified: Secondary | ICD-10-CM | POA: Diagnosis not present

## 2021-09-02 DIAGNOSIS — Z7901 Long term (current) use of anticoagulants: Secondary | ICD-10-CM | POA: Diagnosis not present

## 2021-09-02 DIAGNOSIS — Z9981 Dependence on supplemental oxygen: Secondary | ICD-10-CM | POA: Diagnosis not present

## 2021-09-02 DIAGNOSIS — Z87891 Personal history of nicotine dependence: Secondary | ICD-10-CM | POA: Diagnosis not present

## 2021-09-02 DIAGNOSIS — I5031 Acute diastolic (congestive) heart failure: Secondary | ICD-10-CM | POA: Diagnosis not present

## 2021-09-06 DIAGNOSIS — M6281 Muscle weakness (generalized): Secondary | ICD-10-CM | POA: Diagnosis not present

## 2021-09-06 DIAGNOSIS — Z7901 Long term (current) use of anticoagulants: Secondary | ICD-10-CM | POA: Diagnosis not present

## 2021-09-06 DIAGNOSIS — Z87891 Personal history of nicotine dependence: Secondary | ICD-10-CM | POA: Diagnosis not present

## 2021-09-06 DIAGNOSIS — I5031 Acute diastolic (congestive) heart failure: Secondary | ICD-10-CM | POA: Diagnosis not present

## 2021-09-06 DIAGNOSIS — Z9981 Dependence on supplemental oxygen: Secondary | ICD-10-CM | POA: Diagnosis not present

## 2021-09-06 DIAGNOSIS — I13 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 failure and stage 1 through stage 4 chronic kidney disease, or unspecified chronic kidney disease: Secondary | ICD-10-CM | POA: Diagnosis not present

## 2021-09-06 DIAGNOSIS — N183 Chronic kidney disease, stage 3 unspecified: Secondary | ICD-10-CM | POA: Diagnosis not present

## 2021-09-06 DIAGNOSIS — R2681 Unsteadiness on feet: Secondary | ICD-10-CM | POA: Diagnosis not present

## 2021-09-12 DIAGNOSIS — I13 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 failure and stage 1 through stage 4 chronic kidney disease, or unspecified chronic kidney disease: Secondary | ICD-10-CM | POA: Diagnosis not present

## 2021-09-12 DIAGNOSIS — Z7901 Long term (current) use of anticoagulants: Secondary | ICD-10-CM | POA: Diagnosis not present

## 2021-09-12 DIAGNOSIS — I5031 Acute diastolic (congestive) heart failure: Secondary | ICD-10-CM | POA: Diagnosis not present

## 2021-09-12 DIAGNOSIS — Z87891 Personal history of nicotine dependence: Secondary | ICD-10-CM | POA: Diagnosis not present

## 2021-09-12 DIAGNOSIS — M6281 Muscle weakness (generalized): Secondary | ICD-10-CM | POA: Diagnosis not present

## 2021-09-12 DIAGNOSIS — R2681 Unsteadiness on feet: Secondary | ICD-10-CM | POA: Diagnosis not present

## 2021-09-12 DIAGNOSIS — Z9981 Dependence on supplemental oxygen: Secondary | ICD-10-CM | POA: Diagnosis not present

## 2021-09-12 DIAGNOSIS — N183 Chronic kidney disease, stage 3 unspecified: Secondary | ICD-10-CM | POA: Diagnosis not present

## 2021-09-19 DIAGNOSIS — R0603 Acute respiratory distress: Secondary | ICD-10-CM | POA: Diagnosis not present

## 2021-09-19 DIAGNOSIS — I509 Heart failure, unspecified: Secondary | ICD-10-CM | POA: Diagnosis not present

## 2021-09-20 DIAGNOSIS — I4811 Longstanding persistent atrial fibrillation: Secondary | ICD-10-CM | POA: Diagnosis not present

## 2021-09-20 DIAGNOSIS — I272 Pulmonary hypertension, unspecified: Secondary | ICD-10-CM | POA: Diagnosis not present

## 2021-09-20 DIAGNOSIS — I5030 Unspecified diastolic (congestive) heart failure: Secondary | ICD-10-CM | POA: Diagnosis not present

## 2021-09-22 DIAGNOSIS — I5031 Acute diastolic (congestive) heart failure: Secondary | ICD-10-CM | POA: Diagnosis not present

## 2021-09-22 DIAGNOSIS — Z7901 Long term (current) use of anticoagulants: Secondary | ICD-10-CM | POA: Diagnosis not present

## 2021-09-22 DIAGNOSIS — Z9981 Dependence on supplemental oxygen: Secondary | ICD-10-CM | POA: Diagnosis not present

## 2021-09-22 DIAGNOSIS — I13 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 failure and stage 1 through stage 4 chronic kidney disease, or unspecified chronic kidney disease: Secondary | ICD-10-CM | POA: Diagnosis not present

## 2021-09-22 DIAGNOSIS — M6281 Muscle weakness (generalized): Secondary | ICD-10-CM | POA: Diagnosis not present

## 2021-09-22 DIAGNOSIS — R2681 Unsteadiness on feet: Secondary | ICD-10-CM | POA: Diagnosis not present

## 2021-09-22 DIAGNOSIS — N183 Chronic kidney disease, stage 3 unspecified: Secondary | ICD-10-CM | POA: Diagnosis not present

## 2021-09-22 DIAGNOSIS — Z87891 Personal history of nicotine dependence: Secondary | ICD-10-CM | POA: Diagnosis not present

## 2021-09-24 DIAGNOSIS — Z87891 Personal history of nicotine dependence: Secondary | ICD-10-CM | POA: Diagnosis not present

## 2021-09-24 DIAGNOSIS — Z9981 Dependence on supplemental oxygen: Secondary | ICD-10-CM | POA: Diagnosis not present

## 2021-09-24 DIAGNOSIS — M6281 Muscle weakness (generalized): Secondary | ICD-10-CM | POA: Diagnosis not present

## 2021-09-24 DIAGNOSIS — N183 Chronic kidney disease, stage 3 unspecified: Secondary | ICD-10-CM | POA: Diagnosis not present

## 2021-09-24 DIAGNOSIS — I5031 Acute diastolic (congestive) heart failure: Secondary | ICD-10-CM | POA: Diagnosis not present

## 2021-09-24 DIAGNOSIS — I13 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 failure and stage 1 through stage 4 chronic kidney disease, or unspecified chronic kidney disease: Secondary | ICD-10-CM | POA: Diagnosis not present

## 2021-09-24 DIAGNOSIS — Z7901 Long term (current) use of anticoagulants: Secondary | ICD-10-CM | POA: Diagnosis not present

## 2021-09-24 DIAGNOSIS — R2681 Unsteadiness on feet: Secondary | ICD-10-CM | POA: Diagnosis not present

## 2021-10-01 DIAGNOSIS — Z9981 Dependence on supplemental oxygen: Secondary | ICD-10-CM | POA: Diagnosis not present

## 2021-10-01 DIAGNOSIS — M6281 Muscle weakness (generalized): Secondary | ICD-10-CM | POA: Diagnosis not present

## 2021-10-01 DIAGNOSIS — R2681 Unsteadiness on feet: Secondary | ICD-10-CM | POA: Diagnosis not present

## 2021-10-01 DIAGNOSIS — Z87891 Personal history of nicotine dependence: Secondary | ICD-10-CM | POA: Diagnosis not present

## 2021-10-01 DIAGNOSIS — Z7901 Long term (current) use of anticoagulants: Secondary | ICD-10-CM | POA: Diagnosis not present

## 2021-10-01 DIAGNOSIS — I5031 Acute diastolic (congestive) heart failure: Secondary | ICD-10-CM | POA: Diagnosis not present

## 2021-10-01 DIAGNOSIS — I13 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 failure and stage 1 through stage 4 chronic kidney disease, or unspecified chronic kidney disease: Secondary | ICD-10-CM | POA: Diagnosis not present

## 2021-10-01 DIAGNOSIS — N183 Chronic kidney disease, stage 3 unspecified: Secondary | ICD-10-CM | POA: Diagnosis not present

## 2021-10-02 DIAGNOSIS — M109 Gout, unspecified: Secondary | ICD-10-CM | POA: Diagnosis not present

## 2021-10-02 DIAGNOSIS — R1030 Lower abdominal pain, unspecified: Secondary | ICD-10-CM | POA: Diagnosis not present

## 2021-10-02 DIAGNOSIS — R300 Dysuria: Secondary | ICD-10-CM | POA: Diagnosis not present

## 2021-10-02 DIAGNOSIS — I4891 Unspecified atrial fibrillation: Secondary | ICD-10-CM | POA: Diagnosis not present

## 2021-10-02 DIAGNOSIS — I509 Heart failure, unspecified: Secondary | ICD-10-CM | POA: Diagnosis not present

## 2021-10-02 DIAGNOSIS — N3001 Acute cystitis with hematuria: Secondary | ICD-10-CM | POA: Diagnosis not present

## 2021-10-02 DIAGNOSIS — Z7901 Long term (current) use of anticoagulants: Secondary | ICD-10-CM | POA: Diagnosis not present

## 2021-10-02 DIAGNOSIS — Z87891 Personal history of nicotine dependence: Secondary | ICD-10-CM | POA: Diagnosis not present

## 2021-10-02 DIAGNOSIS — I13 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 failure and stage 1 through stage 4 chronic kidney disease, or unspecified chronic kidney disease: Secondary | ICD-10-CM | POA: Diagnosis not present

## 2021-10-02 DIAGNOSIS — Z79899 Other long term (current) drug therapy: Secondary | ICD-10-CM | POA: Diagnosis not present

## 2021-10-02 DIAGNOSIS — E785 Hyperlipidemia, unspecified: Secondary | ICD-10-CM | POA: Diagnosis not present

## 2021-10-02 DIAGNOSIS — Z8616 Personal history of COVID-19: Secondary | ICD-10-CM | POA: Diagnosis not present

## 2021-10-02 DIAGNOSIS — N189 Chronic kidney disease, unspecified: Secondary | ICD-10-CM | POA: Diagnosis not present

## 2021-10-07 DIAGNOSIS — N183 Chronic kidney disease, stage 3 unspecified: Secondary | ICD-10-CM | POA: Diagnosis not present

## 2021-10-07 DIAGNOSIS — I13 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 failure and stage 1 through stage 4 chronic kidney disease, or unspecified chronic kidney disease: Secondary | ICD-10-CM | POA: Diagnosis not present

## 2021-10-07 DIAGNOSIS — Z9981 Dependence on supplemental oxygen: Secondary | ICD-10-CM | POA: Diagnosis not present

## 2021-10-07 DIAGNOSIS — R2681 Unsteadiness on feet: Secondary | ICD-10-CM | POA: Diagnosis not present

## 2021-10-07 DIAGNOSIS — Z7901 Long term (current) use of anticoagulants: Secondary | ICD-10-CM | POA: Diagnosis not present

## 2021-10-07 DIAGNOSIS — M6281 Muscle weakness (generalized): Secondary | ICD-10-CM | POA: Diagnosis not present

## 2021-10-07 DIAGNOSIS — Z87891 Personal history of nicotine dependence: Secondary | ICD-10-CM | POA: Diagnosis not present

## 2021-10-07 DIAGNOSIS — I5031 Acute diastolic (congestive) heart failure: Secondary | ICD-10-CM | POA: Diagnosis not present

## 2021-10-13 DIAGNOSIS — Z87891 Personal history of nicotine dependence: Secondary | ICD-10-CM | POA: Diagnosis not present

## 2021-10-13 DIAGNOSIS — N3001 Acute cystitis with hematuria: Secondary | ICD-10-CM | POA: Diagnosis not present

## 2021-10-13 DIAGNOSIS — I13 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 failure and stage 1 through stage 4 chronic kidney disease, or unspecified chronic kidney disease: Secondary | ICD-10-CM | POA: Diagnosis not present

## 2021-10-13 DIAGNOSIS — Z7901 Long term (current) use of anticoagulants: Secondary | ICD-10-CM | POA: Diagnosis not present

## 2021-10-13 DIAGNOSIS — E785 Hyperlipidemia, unspecified: Secondary | ICD-10-CM | POA: Diagnosis not present

## 2021-10-13 DIAGNOSIS — Z20822 Contact with and (suspected) exposure to covid-19: Secondary | ICD-10-CM | POA: Diagnosis not present

## 2021-10-13 DIAGNOSIS — Z791 Long term (current) use of non-steroidal anti-inflammatories (NSAID): Secondary | ICD-10-CM | POA: Diagnosis not present

## 2021-10-13 DIAGNOSIS — R309 Painful micturition, unspecified: Secondary | ICD-10-CM | POA: Diagnosis not present

## 2021-10-13 DIAGNOSIS — M109 Gout, unspecified: Secondary | ICD-10-CM | POA: Diagnosis not present

## 2021-10-13 DIAGNOSIS — N183 Chronic kidney disease, stage 3 unspecified: Secondary | ICD-10-CM | POA: Diagnosis not present

## 2021-10-13 DIAGNOSIS — I509 Heart failure, unspecified: Secondary | ICD-10-CM | POA: Diagnosis not present

## 2021-10-15 DIAGNOSIS — R2681 Unsteadiness on feet: Secondary | ICD-10-CM | POA: Diagnosis not present

## 2021-10-15 DIAGNOSIS — I13 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 failure and stage 1 through stage 4 chronic kidney disease, or unspecified chronic kidney disease: Secondary | ICD-10-CM | POA: Diagnosis not present

## 2021-10-15 DIAGNOSIS — Z9981 Dependence on supplemental oxygen: Secondary | ICD-10-CM | POA: Diagnosis not present

## 2021-10-15 DIAGNOSIS — Z87891 Personal history of nicotine dependence: Secondary | ICD-10-CM | POA: Diagnosis not present

## 2021-10-15 DIAGNOSIS — Z7901 Long term (current) use of anticoagulants: Secondary | ICD-10-CM | POA: Diagnosis not present

## 2021-10-15 DIAGNOSIS — N183 Chronic kidney disease, stage 3 unspecified: Secondary | ICD-10-CM | POA: Diagnosis not present

## 2021-10-15 DIAGNOSIS — I5031 Acute diastolic (congestive) heart failure: Secondary | ICD-10-CM | POA: Diagnosis not present

## 2021-10-15 DIAGNOSIS — M6281 Muscle weakness (generalized): Secondary | ICD-10-CM | POA: Diagnosis not present

## 2021-10-17 ENCOUNTER — Ambulatory Visit: Payer: Medicare Other | Admitting: Family Medicine

## 2021-10-17 DIAGNOSIS — I509 Heart failure, unspecified: Secondary | ICD-10-CM | POA: Diagnosis not present

## 2021-10-17 DIAGNOSIS — R0603 Acute respiratory distress: Secondary | ICD-10-CM | POA: Diagnosis not present

## 2021-10-21 ENCOUNTER — Ambulatory Visit: Payer: Medicare Other

## 2021-10-21 DIAGNOSIS — I5031 Acute diastolic (congestive) heart failure: Secondary | ICD-10-CM | POA: Diagnosis not present

## 2021-10-21 DIAGNOSIS — N183 Chronic kidney disease, stage 3 unspecified: Secondary | ICD-10-CM | POA: Diagnosis not present

## 2021-10-21 DIAGNOSIS — Z9981 Dependence on supplemental oxygen: Secondary | ICD-10-CM | POA: Diagnosis not present

## 2021-10-21 DIAGNOSIS — Z87891 Personal history of nicotine dependence: Secondary | ICD-10-CM | POA: Diagnosis not present

## 2021-10-21 DIAGNOSIS — I13 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 failure and stage 1 through stage 4 chronic kidney disease, or unspecified chronic kidney disease: Secondary | ICD-10-CM | POA: Diagnosis not present

## 2021-10-21 DIAGNOSIS — M6281 Muscle weakness (generalized): Secondary | ICD-10-CM | POA: Diagnosis not present

## 2021-10-21 DIAGNOSIS — Z7901 Long term (current) use of anticoagulants: Secondary | ICD-10-CM | POA: Diagnosis not present

## 2021-10-21 DIAGNOSIS — R2681 Unsteadiness on feet: Secondary | ICD-10-CM | POA: Diagnosis not present

## 2021-10-24 ENCOUNTER — Ambulatory Visit (INDEPENDENT_AMBULATORY_CARE_PROVIDER_SITE_OTHER): Payer: Medicare Other | Admitting: Family Medicine

## 2021-10-24 ENCOUNTER — Other Ambulatory Visit: Payer: Self-pay

## 2021-10-24 ENCOUNTER — Encounter: Payer: Self-pay | Admitting: Family Medicine

## 2021-10-24 VITALS — BP 128/84 | HR 65 | Ht 67.0 in | Wt 195.0 lb

## 2021-10-24 DIAGNOSIS — H9193 Unspecified hearing loss, bilateral: Secondary | ICD-10-CM | POA: Diagnosis not present

## 2021-10-24 DIAGNOSIS — I272 Pulmonary hypertension, unspecified: Secondary | ICD-10-CM

## 2021-10-24 DIAGNOSIS — G4733 Obstructive sleep apnea (adult) (pediatric): Secondary | ICD-10-CM

## 2021-10-24 NOTE — Patient Instructions (Addendum)
Thank you for coming to the office today. ? ?Referral to Audiology ? ?[place], [place] ?[address], [address] ?[place], [place] [address] ? ?Phone: ?[phone] ? ?Stay tuned for apt, referral is in now, they will call you. ? ?Once hearing tested on both sides, they will tell you if qualify for hearing aid and help order them for you. ? ?-------------------------------- ? ?Recommend contact the [place] specialists to see if they can change the home oxygen order to an oxygen ? ?Let me know if they can switch automatically or if you need [place] to fax an order. ? ? ?[facility] ?[address] ?[place], [place] [address] ?Ph: [phone] ? ?[place] ?[place], [place] [address] ?Ph: [phone] ?Fax: [phone] ? ? ?Please schedule a Follow-up Appointment to: Return in about 3 months (around 01/24/2022) for 3 month follow-up, sooner if needed. ? ?If you have any other questions or concerns, please feel free to call the office or send a message through [place]. You may also schedule an earlier appointment if necessary. ? ?Additionally, you may be receiving a survey about your experience at our office within a few days to 1 week by e-mail or mail. We value your feedback. ? ?Nobie Putnam, DO ?[place] ?

## 2021-10-24 NOTE — Progress Notes (Signed)
? ?Subjective:  ? ? Patient ID: Kendrick Haapala, male    DOB: [birth year], 88 y.o.   MRN: [id number] ? ?Robert [name] is a 88 y.o. male presenting on 10/24/2021 for Hearing Problem and Hospitalization Follow-up ? ? ?HPI ? ?ED Follow-up UTI ?Last 2 ED visits [place] 10/02/21 and 10/13/21 for UTI, see notes. Had UTi symptoms, treated at that time with antibiotic course and discharged. ?He is feeling better now. No residual symptoms. Finished antibiotics last week ? ?Pulmonary HTN ?History of Acute Resp Failure w Hypoxia ?From prior hospitalization, remains on 2 L continuous O2 ?Receiving oxygen supplies from [facility]. ?He does well on oxygen and asks about future treatment, asking about portable options. ? ?Depression screen [facility] 2/9 08/23/2021 03/10/2021 07/06/2020  ?Decreased Interest 0 0 0  ?Down, Depressed, Hopeless 0 0 0  ?PHQ - 2 Score 0 0 0  ?Altered sleeping 0 0 -  ?Tired, decreased energy 0 0 -  ?Change in appetite 0 0 -  ?Feeling bad or failure about yourself  0 0 -  ?Trouble concentrating 0 0 -  ?Moving slowly or fidgety/restless 0 0 -  ?Suicidal thoughts 0 0 -  ?PHQ-9 Score 0 0 -  ?Difficult doing work/chores Not difficult at all Not difficult at all -  ? ? ?Social History  ? ?Tobacco Use  ? Smoking status: Former  ?  Types: Cigarettes  ?  Quit date: 45  ?  Years since quitting: 38.2  ? Smokeless tobacco: Former  ?Vaping Use  ? Vaping Use: Never used  ?Substance Use Topics  ? Alcohol use: Never  ? Drug use: Never  ? ? ?Review of Systems ?Per HPI unless specifically indicated above ? ?   ?Objective:  ?  ?BP 128/84   Pulse 65   Ht '5\' 7"'  (1.702 m)   Wt 195 lb (88.5 kg)   SpO2 99%   BMI 30.54 kg/m?   ?Wt Readings from Last 3 Encounters:  ?10/24/21 195 lb (88.5 kg)  ?08/23/21 195 lb (88.5 kg)  ?08/12/21 195 lb 9.6 oz (88.7 kg)  ?  ?Physical Exam ?Vitals and nursing note reviewed.  ?Constitutional:   ?   General: He is not in acute distress. ?   Appearance: Normal appearance. He is  well-developed. He is obese. He is not diaphoretic.  ?   Comments: Well-appearing, comfortable, cooperative  ?HENT:  ?   Head: Normocephalic and atraumatic.  ?Eyes:  ?   General:     ?   Right eye: No discharge.     ?   Left eye: No discharge.  ?   Conjunctiva/sclera: Conjunctivae normal.  ?Cardiovascular:  ?   Rate and Rhythm: Normal rate.  ?Pulmonary:  ?   Effort: Pulmonary effort is normal. No respiratory distress.  ?   Breath sounds: Normal breath sounds. No wheezing, rhonchi or rales.  ?   Comments: 2L continuous O2 ?Skin: ?   General: Skin is warm and dry.  ?   Findings: No erythema or rash.  ?Neurological:  ?   Mental Status: He is alert and oriented to person, place, and time.  ?Psychiatric:     ?   Mood and Affect: Mood normal.     ?   Behavior: Behavior normal.     ?   Thought Content: Thought content normal.  ?   Comments: Well groomed, good eye contact, normal speech and thoughts  ? ?Results for orders placed or performed in visit on 08/23/21  ?  BASIC METABOLIC PANEL WITH GFR  ?Result Value Ref Range  ? Glucose, Bld 88 65 - 139 mg/dL  ? BUN 42 (H) 7 - 25 mg/dL  ? Creat 1.63 (H) 0.70 - 1.22 mg/dL  ? eGFR 41 (L) > OR = 60 mL/min/1.106m  ? BUN/Creatinine Ratio 26 (H) 6 - 22 (calc)  ? Sodium 139 135 - 146 mmol/L  ? Potassium 5.3 3.5 - 5.3 mmol/L  ? Chloride 105 98 - 110 mmol/L  ? CO2 29 20 - 32 mmol/L  ? Calcium 9.9 8.6 - 10.3 mg/dL  ?CBC with Differential/Platelet  ?Result Value Ref Range  ? WBC 15.9 (H) 3.8 - 10.8 Thousand/uL  ? RBC 5.43 4.20 - 5.80 Million/uL  ? Hemoglobin 13.3 13.2 - 17.1 g/dL  ? HCT 42.9 38.5 - 50.0 %  ? MCV 79.0 (L) 80.0 - 100.0 fL  ? MCH 24.5 (L) 27.0 - 33.0 pg  ? MCHC 31.0 (L) 32.0 - 36.0 g/dL  ? RDW 17.1 (H) 11.0 - 15.0 %  ? Platelets 204 140 - 400 Thousand/uL  ? MPV  7.5 - 12.5 fL  ? Neutro Abs 4,897 1,500 - 7,800 cells/uL  ? Lymphs Abs 8,888 (H) 850 - 3,900 cells/uL  ? Absolute Monocytes 1,717 (H) 200 - 950 cells/uL  ? Eosinophils Absolute 254 15 - 500 cells/uL  ? Basophils  Absolute 143 0 - 200 cells/uL  ? Neutrophils Relative % 30.8 %  ? Total Lymphocyte 55.9 %  ? Monocytes Relative 10.8 %  ? Eosinophils Relative 1.6 %  ? Basophils Relative 0.9 %  ? Smear Review    ?Magnesium  ?Result Value Ref Range  ? Magnesium 2.1 1.5 - 2.5 mg/dL  ? ?   ?Assessment & Plan:  ? ?Problem List Items Addressed This Visit   ? ? Pulmonary hypertension ([place]  ? OSA (obstructive sleep apnea)  ? ?Other Visit Diagnoses   ? ? Bilateral hearing loss, unspecified hearing loss type    -  Primary  ? Relevant Orders  ? Ambulatory referral to Audiology  ? ?  ?  ?bilateral gradual hearing loss likely presbycusis, does not have hearing aid, will need audiology formal hearing testing bilateral and determine if qualify for hearing aid  ? ?OSA ?Pulm HTN ?On O2 2L daily ?Discussed that [facility] can [facility] for updated O2 home orders, if need portable O2 concentrator ?If they cannot arrange this, may need our office to send orders to a different DME company such as [employer] other for this. ? ?UTI - resolved. ? ?Orders Placed This Encounter  ?Procedures  ? Ambulatory referral to Audiology  ?  Referral Priority:   Routine  ?  Referral Type:   Audiology Exam  ?  Referral Reason:   Specialty Services Required  ?  Number of Visits Requested:   1  ? ? ? ?No orders of the defined types were placed in this encounter. ? ? ? ? ?Follow up plan: ?Return in about 3 months (around 01/24/2022) for 3 month follow-up, sooner if needed. ? ? ?ANobie Putnam DO ?[facility] ?10/24/2021, 4:19 PM ?

## 2021-10-25 DIAGNOSIS — M6281 Muscle weakness (generalized): Secondary | ICD-10-CM | POA: Diagnosis not present

## 2021-10-25 DIAGNOSIS — R2681 Unsteadiness on feet: Secondary | ICD-10-CM | POA: Diagnosis not present

## 2021-10-25 DIAGNOSIS — I5031 Acute diastolic (congestive) heart failure: Secondary | ICD-10-CM | POA: Diagnosis not present

## 2021-10-25 DIAGNOSIS — Z87891 Personal history of nicotine dependence: Secondary | ICD-10-CM | POA: Diagnosis not present

## 2021-10-25 DIAGNOSIS — Z7901 Long term (current) use of anticoagulants: Secondary | ICD-10-CM | POA: Diagnosis not present

## 2021-10-25 DIAGNOSIS — I13 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 failure and stage 1 through stage 4 chronic kidney disease, or unspecified chronic kidney disease: Secondary | ICD-10-CM | POA: Diagnosis not present

## 2021-10-25 DIAGNOSIS — Z9981 Dependence on supplemental oxygen: Secondary | ICD-10-CM | POA: Diagnosis not present

## 2021-10-25 DIAGNOSIS — N183 Chronic kidney disease, stage 3 unspecified: Secondary | ICD-10-CM | POA: Diagnosis not present

## 2021-10-27 DIAGNOSIS — M6281 Muscle weakness (generalized): Secondary | ICD-10-CM | POA: Diagnosis not present

## 2021-10-27 DIAGNOSIS — Z9981 Dependence on supplemental oxygen: Secondary | ICD-10-CM | POA: Diagnosis not present

## 2021-10-27 DIAGNOSIS — R2681 Unsteadiness on feet: Secondary | ICD-10-CM | POA: Diagnosis not present

## 2021-10-27 DIAGNOSIS — I5031 Acute diastolic (congestive) heart failure: Secondary | ICD-10-CM | POA: Diagnosis not present

## 2021-10-27 DIAGNOSIS — Z7901 Long term (current) use of anticoagulants: Secondary | ICD-10-CM | POA: Diagnosis not present

## 2021-10-27 DIAGNOSIS — N183 Chronic kidney disease, stage 3 unspecified: Secondary | ICD-10-CM | POA: Diagnosis not present

## 2021-10-27 DIAGNOSIS — Z87891 Personal history of nicotine dependence: Secondary | ICD-10-CM | POA: Diagnosis not present

## 2021-10-27 DIAGNOSIS — I13 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 failure and stage 1 through stage 4 chronic kidney disease, or unspecified chronic kidney disease: Secondary | ICD-10-CM | POA: Diagnosis not present

## 2021-11-11 DIAGNOSIS — C9110 Chronic lymphocytic leukemia of B-cell type not having achieved remission: Secondary | ICD-10-CM | POA: Diagnosis not present

## 2021-11-17 DIAGNOSIS — R0603 Acute respiratory distress: Secondary | ICD-10-CM | POA: Diagnosis not present

## 2021-11-17 DIAGNOSIS — I509 Heart failure, unspecified: Secondary | ICD-10-CM | POA: Diagnosis not present

## 2021-11-23 DIAGNOSIS — I5030 Unspecified diastolic (congestive) heart failure: Secondary | ICD-10-CM | POA: Diagnosis not present

## 2021-11-23 DIAGNOSIS — I77810 Thoracic aortic ectasia: Secondary | ICD-10-CM | POA: Diagnosis not present

## 2021-11-23 DIAGNOSIS — I361 Nonrheumatic tricuspid (valve) insufficiency: Secondary | ICD-10-CM | POA: Diagnosis not present

## 2021-11-23 DIAGNOSIS — I350 Nonrheumatic aortic (valve) stenosis: Secondary | ICD-10-CM | POA: Diagnosis not present

## 2021-11-23 DIAGNOSIS — I341 Nonrheumatic mitral (valve) prolapse: Secondary | ICD-10-CM | POA: Diagnosis not present

## 2021-12-17 ENCOUNTER — Other Ambulatory Visit: Payer: Self-pay | Admitting: Family Medicine

## 2021-12-17 DIAGNOSIS — R0603 Acute respiratory distress: Secondary | ICD-10-CM | POA: Diagnosis not present

## 2021-12-17 DIAGNOSIS — I509 Heart failure, unspecified: Secondary | ICD-10-CM | POA: Diagnosis not present

## 2021-12-20 ENCOUNTER — Other Ambulatory Visit: Payer: Self-pay | Admitting: Family Medicine

## 2021-12-20 NOTE — Telephone Encounter (Signed)
Requested medication (s) are due for refill today:   Not sure  Provider to review ? ?Requested medication (s) are on the active medication list:   Yes as historical ? ?Future visit scheduled:   No      ? ? ?Last ordered: 10/14/2021 all info given.    ? ?Returned because it's a historical medication.  ? ?Requested Prescriptions  ?Pending Prescriptions Disp Refills  ? atenolol (TENORMIN) 50 MG tablet [Pharmacy Med Name: ATENOLOL 50 MG TABLET] 90 tablet 0  ?  Sig: Take 1 tablet (50 mg total) by mouth daily.  ?  ? Cardiovascular: Beta Blockers 2 Failed - 12/17/2021 12:41 PM  ?  ?  Failed - Cr in normal range and within 360 days  ?  Creat  ?Date Value Ref Range Status  ?08/23/2021 1.63 (H) 0.70 - 1.22 mg/dL Final  ?   ?  ?  Passed - Last BP in normal range  ?  BP Readings from Last 1 Encounters:  ?10/24/21 128/84  ?   ?  ?  Passed - Last Heart Rate in normal range  ?  Pulse Readings from Last 1 Encounters:  ?10/24/21 65  ?   ?  ?  Passed - Valid encounter within last 6 months  ?  Recent Outpatient Visits   ? ?      ? 1 month ago Bilateral hearing loss, unspecified hearing loss type  ? [place], DO  ? 3 months ago Acute heart failure with preserved ejection fraction (HFpEF) ([place])  ? [place], DO  ? 4 months ago Dyspnea on exertion  ? [place], DO  ? 5 months ago Chronic pain of right knee  ? [place], DO  ? 9 months ago Chronic gout of right hand, unspecified cause  ? [place], DO  ? ?  ?  ? ? ?  ?  ?  ? ?

## 2022-01-16 ENCOUNTER — Other Ambulatory Visit: Payer: Self-pay | Admitting: Family Medicine

## 2022-01-16 DIAGNOSIS — M1A9XX0 Chronic gout, unspecified, without tophus (tophi): Secondary | ICD-10-CM

## 2022-01-17 DIAGNOSIS — R0603 Acute respiratory distress: Secondary | ICD-10-CM | POA: Diagnosis not present

## 2022-01-17 DIAGNOSIS — I509 Heart failure, unspecified: Secondary | ICD-10-CM | POA: Diagnosis not present

## 2022-01-17 NOTE — Telephone Encounter (Signed)
Requested medications are due for refill today.  yes  Requested medications are on the active medications list.  yes  Last refill. 03/10/2021 #90 1  Future visit scheduled.   no  Notes to clinic.  Medication RF failed protocol d/t expired labs.    Requested Prescriptions  Pending Prescriptions Disp Refills   allopurinol (ZYLOPRIM) 100 MG tablet [Pharmacy Med Name: ALLOPURINOL 100 MG TABLET] 90 tablet 0    Sig: Take 1 tablet (100 mg total) by mouth daily.     Endocrinology:  Gout Agents - allopurinol Failed - 01/16/2022  8:50 AM      Failed - Uric Acid in normal range and within 360 days    Uric Acid, Serum  Date Value Ref Range Status  03/23/2020 9.8 (H) 4.0 - 8.0 mg/dL Final    Comment:    Therapeutic target for gout patients: <6.0 mg/dL .          Failed - Cr in normal range and within 360 days    Creat  Date Value Ref Range Status  08/23/2021 1.63 (H) 0.70 - 1.22 mg/dL Final         Passed - Valid encounter within last 12 months    Recent Outpatient Visits           2 months ago Bilateral hearing loss, unspecified hearing loss type   [place], DO   4 months ago Acute heart failure with preserved ejection fraction (HFpEF) ([place])   [place], DO   5 months ago Dyspnea on exertion   [place], DO   6 months ago Chronic pain of right knee   [place], DO   10 months ago Chronic gout of right hand, unspecified cause   [place], DO              Passed - CBC within normal limits and completed in the last 12 months    WBC  Date Value Ref Range Status  08/23/2021 15.9 (H) 3.8 - 10.8 Thousand/uL Final   RBC  Date Value Ref Range Status  08/23/2021 5.43 4.20 - 5.80 Million/uL Final   Hemoglobin  Date Value Ref Range Status  08/23/2021 13.3 13.2 -  17.1 g/dL Final   HCT  Date Value Ref Range Status  08/23/2021 42.9 38.5 - 50.0 % Final   MCHC  Date Value Ref Range Status  08/23/2021 31.0 (L) 32.0 - 36.0 g/dL Final   [facility]  Date Value Ref Range Status  08/23/2021 24.5 (L) 27.0 - 33.0 pg Final   MCV  Date Value Ref Range Status  08/23/2021 79.0 (L) 80.0 - 100.0 fL Final   No results found for: "PLTCOUNTKUC", "LABPLAT", "POCPLA" RDW  Date Value Ref Range Status  08/23/2021 17.1 (H) 11.0 - 15.0 % Final

## 2022-02-16 DIAGNOSIS — R0603 Acute respiratory distress: Secondary | ICD-10-CM | POA: Diagnosis not present

## 2022-02-16 DIAGNOSIS — I509 Heart failure, unspecified: Secondary | ICD-10-CM | POA: Diagnosis not present

## 2022-02-17 ENCOUNTER — Other Ambulatory Visit: Payer: Self-pay | Admitting: Family Medicine

## 2022-02-17 DIAGNOSIS — I4821 Permanent atrial fibrillation: Secondary | ICD-10-CM

## 2022-02-17 MED ORDER — APIXABAN 2.5 MG PO TABS: 2.5000 mg | ORAL_TABLET | Freq: Two times a day (BID) | ORAL | 6 refills | Status: DC

## 2022-02-17 NOTE — Telephone Encounter (Signed)
Rx 08/12/21 #180 3RF- Call to pharmacy to verify- they state his insurance only covers 1 month supply- request Rx reflect that for easier RF. #60 RF 6RF Requested Prescriptions  Pending Prescriptions Disp Refills  . apixaban (ELIQUIS) 2.5 MG TABS tablet 180 tablet 3    Sig: Take 1 tablet (2.5 mg total) by mouth 2 (two) times daily.     Hematology:  Anticoagulants - apixaban Failed - 02/17/2022 10:04 AM      Failed - Cr in normal range and within 360 days    Creat  Date Value Ref Range Status  08/23/2021 1.63 (H) 0.70 - 1.22 mg/dL Final         Failed - AST in normal range and within 360 days    AST  Date Value Ref Range Status  03/23/2020 17 10 - 35 U/L Final         Failed - ALT in normal range and within 360 days    ALT  Date Value Ref Range Status  03/23/2020 8 (L) 9 - 46 U/L Final         Passed - PLT in normal range and within 360 days    Platelets  Date Value Ref Range Status  08/23/2021 204 140 - 400 Thousand/uL Final         Passed - HGB in normal range and within 360 days    Hemoglobin  Date Value Ref Range Status  08/23/2021 13.3 13.2 - 17.1 g/dL Final         Passed - HCT in normal range and within 360 days    HCT  Date Value Ref Range Status  08/23/2021 42.9 38.5 - 50.0 % Final         Passed - Valid encounter within last 12 months    Recent Outpatient Visits          3 months ago Bilateral hearing loss, unspecified hearing loss type   [place], DO   5 months ago Acute heart failure with preserved ejection fraction (HFpEF) [facility])   [facility] [place], Devonne Doughty, DO   6 months ago Dyspnea on exertion   [place], DO   7 months ago Chronic pain of right knee   [place], DO   11 months ago Chronic gout of right hand, unspecified cause   [place], Devonne Doughty, DO       Future Appointments            In 3 days [place], Devonne Doughty, DO [facility], [facility]

## 2022-02-17 NOTE — Telephone Encounter (Signed)
Medication Refill - Medication: apixaban (ELIQUIS) 2.5 MG TABS tablet [[id number]]   Pt took last pill today Has the patient contacted their pharmacy? Yes.   (Agent: If no, request that the patient contact the pharmacy for the refill. If patient does not wish to contact the pharmacy document the reason why and proceed with request.) (Agent: If yes, when and what did the pharmacy advise?)  Preferred Pharmacy (with phone number or street name):  [address], [address]  [place] [address]  Phone: [phone] Fax: [phone]  Hours: Not open 24 hours   Has the patient been seen for an appointment in the last year OR does the patient have an upcoming appointment? Yes.  02/20/22  Agent: Please be advised that RX refills may take up to 3 business days. We ask that you follow-up with your pharmacy.

## 2022-02-20 ENCOUNTER — Ambulatory Visit: Payer: Medicare Other | Admitting: Family Medicine

## 2022-02-24 ENCOUNTER — Ambulatory Visit (INDEPENDENT_AMBULATORY_CARE_PROVIDER_SITE_OTHER): Payer: Self-pay | Admitting: Family Medicine

## 2022-02-24 ENCOUNTER — Encounter: Payer: Self-pay | Admitting: Family Medicine

## 2022-02-24 VITALS — BP 157/72 | HR 69 | Ht 67.0 in | Wt 195.0 lb

## 2022-02-24 DIAGNOSIS — F5101 Primary insomnia: Secondary | ICD-10-CM

## 2022-02-24 DIAGNOSIS — M1711 Unilateral primary osteoarthritis, right knee: Secondary | ICD-10-CM

## 2022-02-24 DIAGNOSIS — I4821 Permanent atrial fibrillation: Secondary | ICD-10-CM

## 2022-02-24 DIAGNOSIS — G8929 Other chronic pain: Secondary | ICD-10-CM

## 2022-02-24 DIAGNOSIS — M25561 Pain in right knee: Secondary | ICD-10-CM

## 2022-02-24 MED ORDER — TRAZODONE HCL 50 MG PO TABS: 50.0000 mg | ORAL_TABLET | Freq: Every day | ORAL | 1 refills | Status: DC

## 2022-02-24 NOTE — Progress Notes (Unsigned)
Subjective:    Patient ID: Robert [name], male    DOB: Apr 03, 1932, 88 y.o.   MRN: [id number]  Robert [name] is a 88 y.o. male presenting on 02/24/2022 for Knee Pain and Insomnia   HPI  Paroxysmal Atrial Fibrillation Eliquis Already refilled  Insomnia Difficulty falling asleep and staying asleep They tried to give him Melatonin previously. Has not used recently. Has tried Trazodone before in hospital. Asking for rx to help.  Legs Swelling / Edema Off Fluid pills HCTZ, Furosemide Left lower ext with ulceration now healing but has dry skin  Follow-up Knee Pain RIGHT / Osteoarthritis / Bilateral Gout Arthritis PMH Gout  Knee Pain Asking if he can use Voltaren.       08/23/2021   11:14 AM 03/10/2021    8:54 AM 07/06/2020    8:45 AM  Depression screen PHQ 2/9  Decreased Interest 0 0 0  Down, Depressed, Hopeless 0 0 0  PHQ - 2 Score 0 0 0  Altered sleeping 0 0   Tired, decreased energy 0 0   Change in appetite 0 0   Feeling bad or failure about yourself  0 0   Trouble concentrating 0 0   Moving slowly or fidgety/restless 0 0   Suicidal thoughts 0 0   PHQ-9 Score 0 0   Difficult doing work/chores Not difficult at all Not difficult at all     Social History   Tobacco Use   Smoking status: Former    Types: Cigarettes    Quit date: 1985    Years since quitting: 38.5   Smokeless tobacco: Former  Scientific laboratory technician Use: Never used  Substance Use Topics   Alcohol use: Never   Drug use: Never    Review of Systems Per HPI unless specifically indicated above     Objective:    BP (!) 157/72   Pulse 69   Ht '5\' 7"'  (1.702 m)   Wt 195 lb (88.5 kg)   SpO2 98%   BMI 30.54 kg/m   Wt Readings from Last 3 Encounters:  02/24/22 195 lb (88.5 kg)  10/24/21 195 lb (88.5 kg)  08/23/21 195 lb (88.5 kg)    Physical Exam Vitals and nursing note reviewed.  Constitutional:      General: He is not in acute distress.    Appearance: He is well-developed. He is not  diaphoretic.     Comments: Chronic ill elderly male 7 currently well-appearing, comfortable, cooperative  HENT:     Head: Normocephalic and atraumatic.  Eyes:     General:        Right eye: No discharge.        Left eye: No discharge.     Conjunctiva/sclera: Conjunctivae normal.  Neck:     Thyroid: No thyromegaly.  Cardiovascular:     Rate and Rhythm: Normal rate and regular rhythm.     Pulses: Normal pulses.     Heart sounds: Normal heart sounds. No murmur heard. Pulmonary:     Effort: Pulmonary effort is normal. No respiratory distress.     Breath sounds: Normal breath sounds. No wheezing or rales.  Musculoskeletal:        General: Normal range of motion.     Cervical back: Normal range of motion and neck supple.     Right lower leg: Edema present.     Left lower leg: Edema present.  Lymphadenopathy:     Cervical: No cervical adenopathy.  Skin:    General:  Skin is warm and dry.     Findings: Lesion (dry skin dermatitis anterior leg with healing ulceration now with only minor scab and healing tissue appears to be doing well, no erythema. no open sore.) present. No erythema or rash.  Neurological:     Mental Status: He is alert and oriented to person, place, and time. Mental status is at baseline.  Psychiatric:        Behavior: Behavior normal.     Comments: Well groomed, good eye contact, normal speech and thoughts    Results for orders placed or performed in visit on 30/86/57  BASIC METABOLIC PANEL WITH GFR  Result Value Ref Range   Glucose, Bld 88 65 - 139 mg/dL   BUN 42 (H) 7 - 25 mg/dL   Creat 1.63 (H) 0.70 - 1.22 mg/dL   eGFR 41 (L) > OR = 60 mL/min/1.8m   BUN/Creatinine Ratio 26 (H) 6 - 22 (calc)   Sodium 139 135 - 146 mmol/L   Potassium 5.3 3.5 - 5.3 mmol/L   Chloride 105 98 - 110 mmol/L   CO2 29 20 - 32 mmol/L   Calcium 9.9 8.6 - 10.3 mg/dL  CBC with Differential/Platelet  Result Value Ref Range   WBC 15.9 (H) 3.8 - 10.8 Thousand/uL   RBC 5.43 4.20 -  5.80 Million/uL   Hemoglobin 13.3 13.2 - 17.1 g/dL   HCT 42.9 38.5 - 50.0 %   MCV 79.0 (L) 80.0 - 100.0 fL   MCH 24.5 (L) 27.0 - 33.0 pg   MCHC 31.0 (L) 32.0 - 36.0 g/dL   RDW 17.1 (H) 11.0 - 15.0 %   Platelets 204 140 - 400 Thousand/uL   MPV  7.5 - 12.5 fL   Neutro Abs 4,897 1,500 - 7,800 cells/uL   Lymphs Abs 8,888 (H) 850 - 3,900 cells/uL   Absolute Monocytes 1,717 (H) 200 - 950 cells/uL   Eosinophils Absolute 254 15 - 500 cells/uL   Basophils Absolute 143 0 - 200 cells/uL   Neutrophils Relative % 30.8 %   Total Lymphocyte 55.9 %   Monocytes Relative 10.8 %   Eosinophils Relative 1.6 %   Basophils Relative 0.9 %   Smear Review    Magnesium  Result Value Ref Range   Magnesium 2.1 1.5 - 2.5 mg/dL      Assessment & Plan:   Problem List Items Addressed This Visit     Atrial fibrillation (HCC)   Chronic pain of right knee   Primary osteoarthritis of right knee - Primary   Other Visit Diagnoses     Primary insomnia       Relevant Medications   traZODone (DESYREL) 50 MG tablet       Eliquis has 30 day available rx and +6 extra refills.  Okay to use the Voltaren topical. Up to max of 4 times per day, prefer 2-3 times per day.  Insomnia Go ahead and take Trazodone 574mnightly to help fall asleep. May take 2-3  weeks to really take effect. If it does not work successfully in 4-6 weeks you can double it and take TWO pills = 5052m 2 = 100m24mghtly. If you need higher dose we can re order a different version.  Use vaseline and ointment / neosporin whichever option you prefer to keep the area on leg moist and protected.  You can use a non adherent pad or non stick and wrap with coban self adhering wrap, I would avoid tape or adhesive on  skin. And can use compress socks or stockings to keep in place.  Use RICE therapy: - R - Rest / relative rest with activity modification avoid overuse of joint - I - Ice packs (make sure you use a towel or sock / something to protect  skin) - C - Compression with socks / stockings or ACE wrap to apply pressure and reduce swelling allowing more support - E - Elevation - if significant swelling, lift leg above heart level (toes above your nose) to help reduce swelling, most helpful at night after day of being on your feet  If worsening swelling let me know we can re order something.   Meds ordered this encounter  Medications   traZODone (DESYREL) 50 MG tablet    Sig: Take 1 tablet (50 mg total) by mouth at bedtime.    Dispense:  90 tablet    Refill:  1      Follow up plan: Return in about 4 months (around 06/27/2022) for 4 month follow-up  knee pain, insomnia, edema.   Nobie Putnam, [facility] 02/24/2022, 10:10 AM

## 2022-02-24 NOTE — Patient Instructions (Addendum)
Thank you for coming to the office today.  Eliquis has 30 day available rx and +6 extra refills.  Okay to use the Voltaren topical. Up to max of 4 times per day, prefer 2-3 times per day.  Insomnia Go ahead and take Trazodone '50mg'$  nightly to help fall asleep. May take 2-3  weeks to really take effect. If it does not work successfully in 4-6 weeks you can double it and take TWO pills = '50mg'$  x 2 = '100mg'$  nightly. If you need higher dose we can re order a different version.  Use vaseline and ointment / neosporin whichever option you prefer to keep the area on leg moist and protected.  You can use a non adherent pad or non stick and wrap with coban self adhering wrap, I would avoid tape or adhesive on skin. And can use compress socks or stockings to keep in place.  Use RICE therapy: - R - Rest / relative rest with activity modification avoid overuse of joint - I - Ice packs (make sure you use a towel or sock / something to protect skin) - C - Compression with socks / stockings or ACE wrap to apply pressure and reduce swelling allowing more support - E - Elevation - if significant swelling, lift leg above heart level (toes above your nose) to help reduce swelling, most helpful at night after day of being on your feet  If worsening swelling let me know we can re order something.   Please schedule a Follow-up Appointment to: Return in about 4 months (around 06/27/2022) for 4 month follow-up  knee pain, insomnia, edema.  If you have any other questions or concerns, please feel free to call the office or send a message through [place]. You may also schedule an earlier appointment if necessary.  Additionally, you may be receiving a survey about your experience at our office within a few days to 1 week by e-mail or mail. We value your feedback.  Nobie Putnam, DO [place]

## 2022-03-19 DIAGNOSIS — I509 Heart failure, unspecified: Secondary | ICD-10-CM | POA: Diagnosis not present

## 2022-03-19 DIAGNOSIS — R0603 Acute respiratory distress: Secondary | ICD-10-CM | POA: Diagnosis not present

## 2022-03-23 ENCOUNTER — Other Ambulatory Visit: Payer: Self-pay | Admitting: Family Medicine

## 2022-03-23 DIAGNOSIS — L2089 Other atopic dermatitis: Secondary | ICD-10-CM

## 2022-03-23 DIAGNOSIS — J8410 Pulmonary fibrosis, unspecified: Secondary | ICD-10-CM | POA: Diagnosis not present

## 2022-03-23 DIAGNOSIS — C9110 Chronic lymphocytic leukemia of B-cell type not having achieved remission: Secondary | ICD-10-CM | POA: Diagnosis not present

## 2022-03-23 DIAGNOSIS — J310 Chronic rhinitis: Secondary | ICD-10-CM

## 2022-03-24 ENCOUNTER — Ambulatory Visit: Payer: PPO

## 2022-03-24 NOTE — Telephone Encounter (Signed)
Requested Prescriptions  Pending Prescriptions Disp Refills  . fluticasone (FLONASE) 50 MCG/ACT nasal spray [Pharmacy Med Name: FLUTICASONE PROP 50 MCG SPRAY] 16 g 0    Sig: Place 2 sprays into both nostrils daily. Use for 4-6 weeks then stop and use seasonally or as needed.     Ear, Nose, and Throat: Nasal Preparations - Corticosteroids Passed - 03/23/2022  1:35 PM      Passed - Valid encounter within last 12 months    Recent Outpatient Visits          4 weeks ago Primary osteoarthritis of right knee   [place], DO   5 months ago Bilateral hearing loss, unspecified hearing loss type   [place], DO   7 months ago Acute heart failure with preserved ejection fraction (HFpEF) [facility])   [place], DO   7 months ago Dyspnea on exertion   [place], DO   8 months ago Chronic pain of right knee   [place], DO      Future Appointments            Today [facility], [facility]            . triamcinolone cream (KENALOG) 0.5 % [Pharmacy Med Name: TRIAMCINOLONE 0.5% CREAM] 30 g 0    Sig: Apply 1 application topically 2 (two) times daily. To affected areas, for up to 2 weeks.     Not Delegated - Dermatology:  Corticosteroids Failed - 03/23/2022  1:35 PM      Failed - This refill cannot be delegated      Passed - Valid encounter within last 12 months    Recent Outpatient Visits          4 weeks ago Primary osteoarthritis of right knee   [place], DO   5 months ago Bilateral hearing loss, unspecified hearing loss type   [place], DO   7 months ago Acute heart failure with preserved ejection fraction (HFpEF) [facility])   [place], DO   7 months  ago Dyspnea on exertion   [place], DO   8 months ago Chronic pain of right knee   [place], Devonne Doughty, DO      Future Appointments            Today [facility], [facility]

## 2022-03-24 NOTE — Telephone Encounter (Signed)
Requested medication (s) are due for refill today: no  Requested medication (s) are on the active medication list: no  Last refill:  02/14/2019  Future visit scheduled: yes today  Notes to clinic:  rx was dc'd on 05/26/2019, not delegated as well.      Requested Prescriptions  Pending Prescriptions Disp Refills   triamcinolone cream (KENALOG) 0.5 % [Pharmacy Med Name: TRIAMCINOLONE 0.5% CREAM] 30 g 0    Sig: Apply 1 application topically 2 (two) times daily. To affected areas, for up to 2 weeks.     Not Delegated - Dermatology:  Corticosteroids Failed - 03/23/2022  1:35 PM      Failed - This refill cannot be delegated      Passed - Valid encounter within last 12 months    Recent Outpatient Visits           4 weeks ago Primary osteoarthritis of right knee   [place], DO   5 months ago Bilateral hearing loss, unspecified hearing loss type   [place], DO   7 months ago Acute heart failure with preserved ejection fraction (HFpEF) [facility])   [facility] Olin Hauser, DO   7 months ago Dyspnea on exertion   [place], DO   8 months ago Chronic pain of right knee   [place], DO       Future Appointments             Today [facility]             Signed Prescriptions Disp Refills   fluticasone (FLONASE) 50 MCG/ACT nasal spray 16 g 1    Sig: Place 2 sprays into both nostrils daily. Use for 4-6 weeks then stop and use seasonally or as needed.     Ear, Nose, and Throat: Nasal Preparations - Corticosteroids Passed - 03/23/2022  1:35 PM      Passed - Valid encounter within last 12 months    Recent Outpatient Visits           4 weeks ago Primary osteoarthritis of right knee   [place], DO   5 months ago Bilateral  hearing loss, unspecified hearing loss type   [place], DO   7 months ago Acute heart failure with preserved ejection fraction (HFpEF) [facility])   [place], DO   7 months ago Dyspnea on exertion   [place], DO   8 months ago Chronic pain of right knee   [place], Devonne Doughty, DO       Future Appointments             Today [facility]

## 2022-03-27 ENCOUNTER — Telehealth: Payer: Self-pay

## 2022-03-27 NOTE — Telephone Encounter (Signed)
Copied from [place] [id number]. Topic: General - Other >> Mar 24, 2022  3:38 PM Robert [name] wrote: Reason for CRM: The patient's daughter has called for an update on their refill request for triamcinolone cream (KENALOG) 0.5 % [Pharmacy Med Name: TRIAMCINOLONE 0.5% CREAM]  Please contact the patient further when possible     Called and left a message on daughter, Robert [name]'s machine... Dr. Raliegh Ip filled the request on Friday and it was sent to [employer].

## 2022-04-19 DIAGNOSIS — I509 Heart failure, unspecified: Secondary | ICD-10-CM | POA: Diagnosis not present

## 2022-04-19 DIAGNOSIS — R0603 Acute respiratory distress: Secondary | ICD-10-CM | POA: Diagnosis not present

## 2022-05-03 ENCOUNTER — Ambulatory Visit: Payer: Self-pay | Admitting: *Deleted

## 2022-05-03 DIAGNOSIS — Z87891 Personal history of nicotine dependence: Secondary | ICD-10-CM | POA: Diagnosis not present

## 2022-05-03 DIAGNOSIS — I5030 Unspecified diastolic (congestive) heart failure: Secondary | ICD-10-CM | POA: Diagnosis not present

## 2022-05-03 DIAGNOSIS — I4891 Unspecified atrial fibrillation: Secondary | ICD-10-CM | POA: Diagnosis not present

## 2022-05-03 DIAGNOSIS — R0602 Shortness of breath: Secondary | ICD-10-CM | POA: Diagnosis not present

## 2022-05-03 DIAGNOSIS — Z7901 Long term (current) use of anticoagulants: Secondary | ICD-10-CM | POA: Diagnosis not present

## 2022-05-03 DIAGNOSIS — E785 Hyperlipidemia, unspecified: Secondary | ICD-10-CM | POA: Diagnosis not present

## 2022-05-03 DIAGNOSIS — R059 Cough, unspecified: Secondary | ICD-10-CM | POA: Diagnosis not present

## 2022-05-03 DIAGNOSIS — J9811 Atelectasis: Secondary | ICD-10-CM | POA: Diagnosis not present

## 2022-05-03 DIAGNOSIS — N189 Chronic kidney disease, unspecified: Secondary | ICD-10-CM | POA: Diagnosis not present

## 2022-05-03 DIAGNOSIS — R600 Localized edema: Secondary | ICD-10-CM | POA: Diagnosis not present

## 2022-05-03 DIAGNOSIS — I13 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 failure and stage 1 through stage 4 chronic kidney disease, or unspecified chronic kidney disease: Secondary | ICD-10-CM | POA: Diagnosis not present

## 2022-05-03 DIAGNOSIS — R0989 Other specified symptoms and signs involving the circulatory and respiratory systems: Secondary | ICD-10-CM | POA: Diagnosis not present

## 2022-05-03 DIAGNOSIS — Z20822 Contact with and (suspected) exposure to covid-19: Secondary | ICD-10-CM | POA: Diagnosis not present

## 2022-05-03 NOTE — Telephone Encounter (Signed)
Reason for Disposition  [1] MODERATE difficulty breathing (e.g., speaks in phrases, SOB even at rest, pulse 100-120) AND [2] NEW-onset or WORSE than normal  Answer Assessment - Initial Assessment Questions 1. RESPIRATORY STATUS: "Describe your breathing?" (e.g., wheezing, shortness of breath, unable to speak, severe coughing)      Uses portable O2- finds he is SOB 2. ONSET: "When did this breathing problem begin?"      Chronic breathing issue 3. PATTERN "Does the difficult breathing come and go, or has it been constant since it started?"      Exertion, laying down 4. SEVERITY: "How bad is your breathing?" (e.g., mild, moderate, severe)    - MILD: No SOB at rest, mild SOB with walking, speaks normally in sentences, can lie down, no retractions, pulse < 100.    - MODERATE: SOB at rest, SOB with minimal exertion and prefers to sit, cannot lie down flat, speaks in phrases, mild retractions, audible wheezing, pulse 100-120.    - SEVERE: Very SOB at rest, speaks in single words, struggling to breathe, sitting hunched forward, retractions, pulse > 120      moderate 5. RECURRENT SYMPTOM: "Have you had difficulty breathing before?" If Yes, ask: "When was the last time?" and "What happened that time?"      Yes- hx of excess fluid 6. CARDIAC HISTORY: "Do you have any history of heart disease?" (e.g., heart attack, angina, bypass surgery, angioplasty)      *No Answer* 7. LUNG HISTORY: "Do you have any history of lung disease?"  (e.g., pulmonary embolus, asthma, emphysema)     *No Answer* 8. CAUSE: "What do you think is causing the breathing problem?"      Excessive fluid 9. OTHER SYMPTOMS: "Do you have any other symptoms? (e.g., dizziness, runny nose, cough, chest pain, fever)     Abdominal pAIN 10. O2 SATURATION MONITOR:  "Do you use an oxygen saturation monitor (pulse oximeter) at home?" If Yes, ask: "What is your reading (oxygen level) today?" "What is your usual oxygen saturation reading?" (e.g.,  95%)       99% 11. PREGNANCY: "Is there any chance you are pregnant?" "When was your last menstrual period?"         12. TRAVEL: "Have you traveled out of the country in the last month?" (e.g., travel history, exposures)  Protocols used: Breathing Difficulty-A-AH

## 2022-05-03 NOTE — Telephone Encounter (Signed)
  Chief Complaint: SOB, abdominal pain Symptoms: SOB, abdominal pain Frequency: chronic- but worse in last few days  Pertinent Negatives: Patient denies   Disposition: '[x]'$ ED /'[]'$ Urgent Care (no appt availability in office) / '[]'$ Appointment(In office/virtual)/ '[]'$  [facility] [facility]/ '[]'$ Home Care/ '[]'$ Refused Recommended Disposition /'[]'$ [place] [facility]/ '[]'$  Follow-up with PCP Additional Notes: Due to multiple high risk complaints- ED recommended.

## 2022-05-05 DIAGNOSIS — R0602 Shortness of breath: Secondary | ICD-10-CM | POA: Diagnosis not present

## 2022-05-19 DIAGNOSIS — R0603 Acute respiratory distress: Secondary | ICD-10-CM | POA: Diagnosis not present

## 2022-05-19 DIAGNOSIS — I509 Heart failure, unspecified: Secondary | ICD-10-CM | POA: Diagnosis not present

## 2022-05-24 IMAGING — DX DG KNEE COMPLETE 4+V*R*
4 series · 4 of 4 positions shown · non-contrast
Comparison: 07/03/2018

CLINICAL DATA: Chronic pain

EXAM:
RIGHT KNEE - COMPLETE 4+ VIEW

[knee ap]
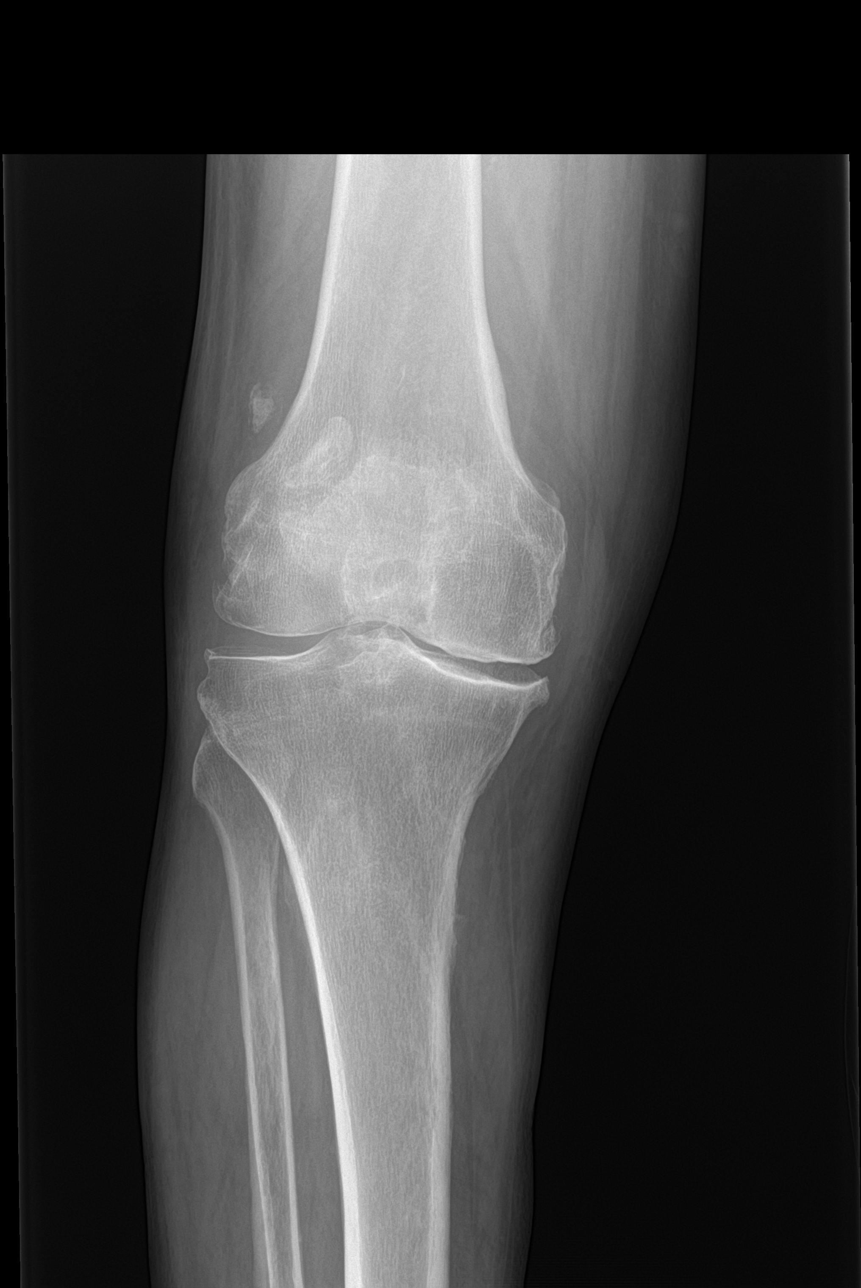

[knee lat]
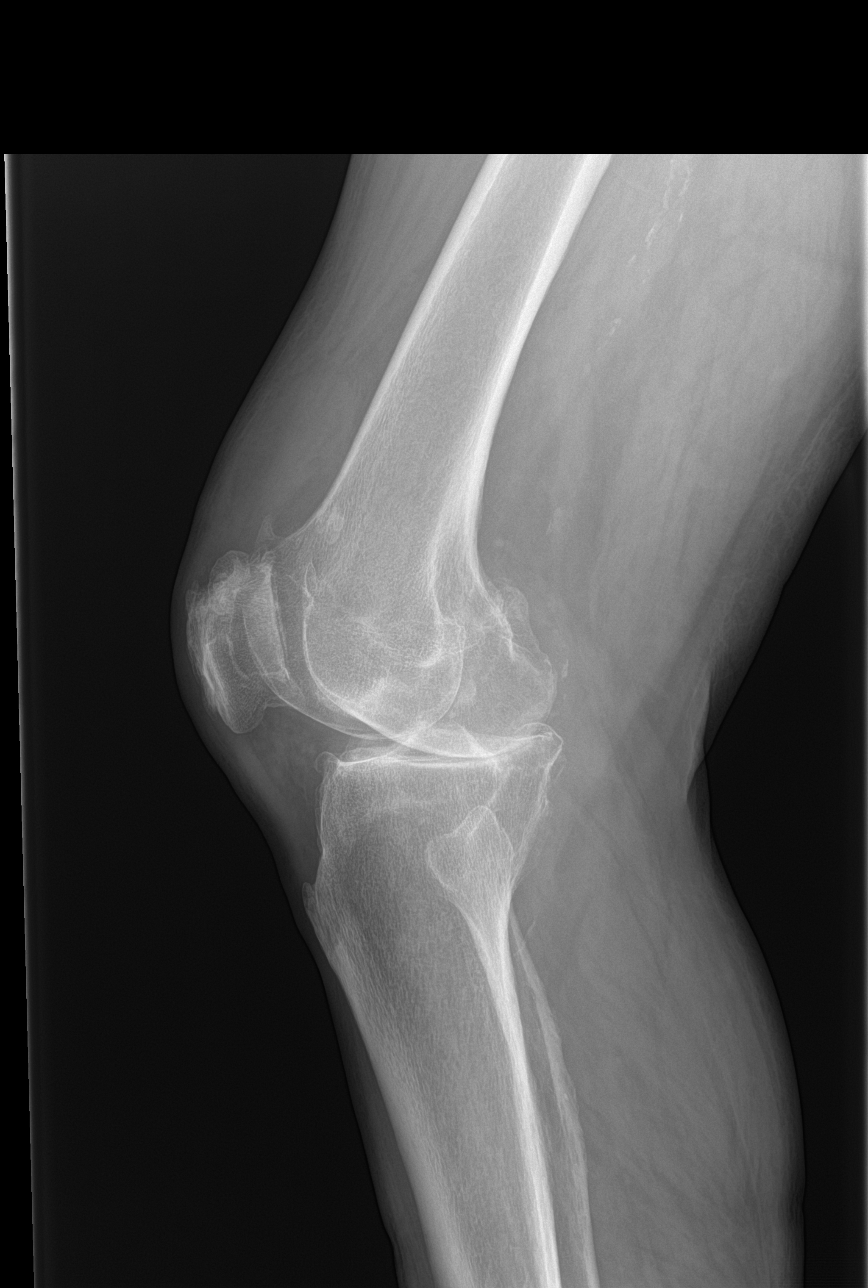

[knee obl (1 of 2)]
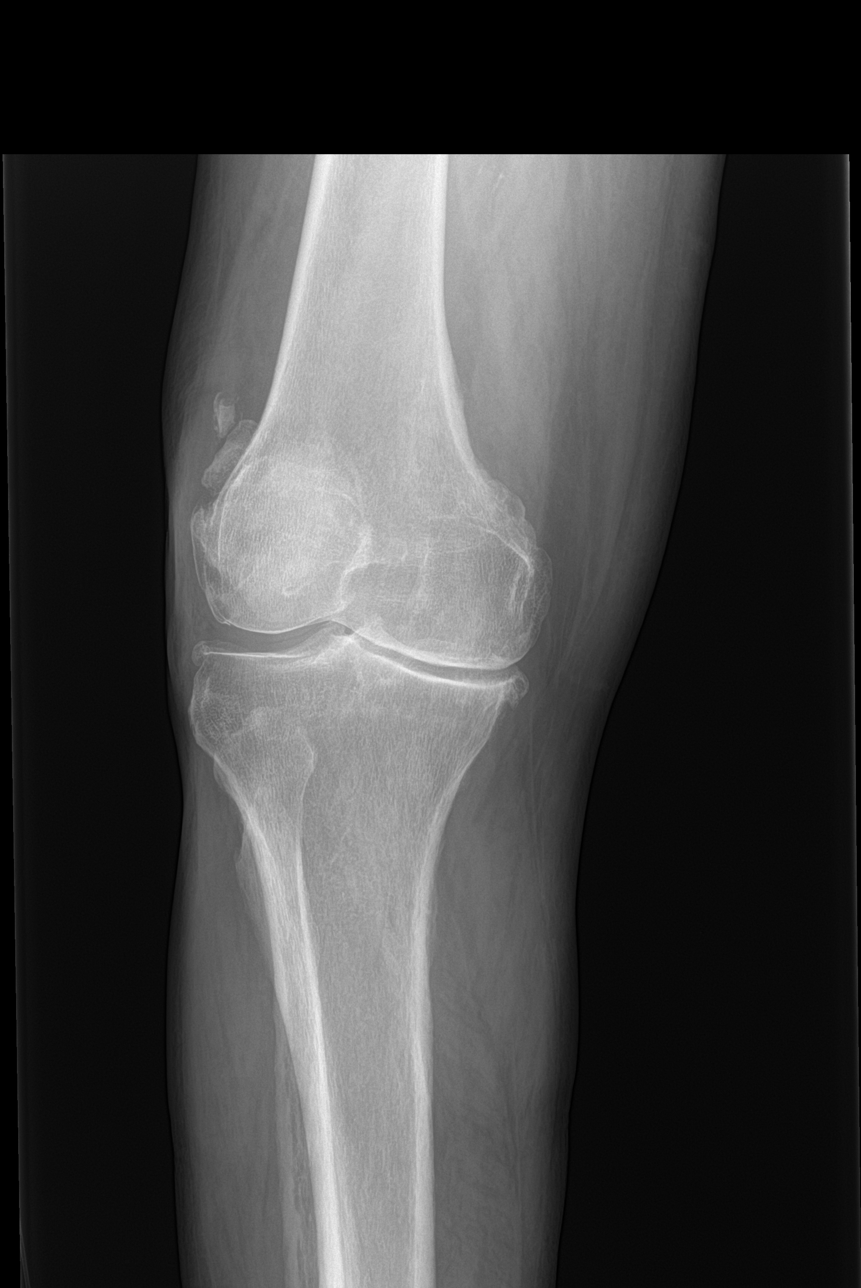

[knee obl (2 of 2)]
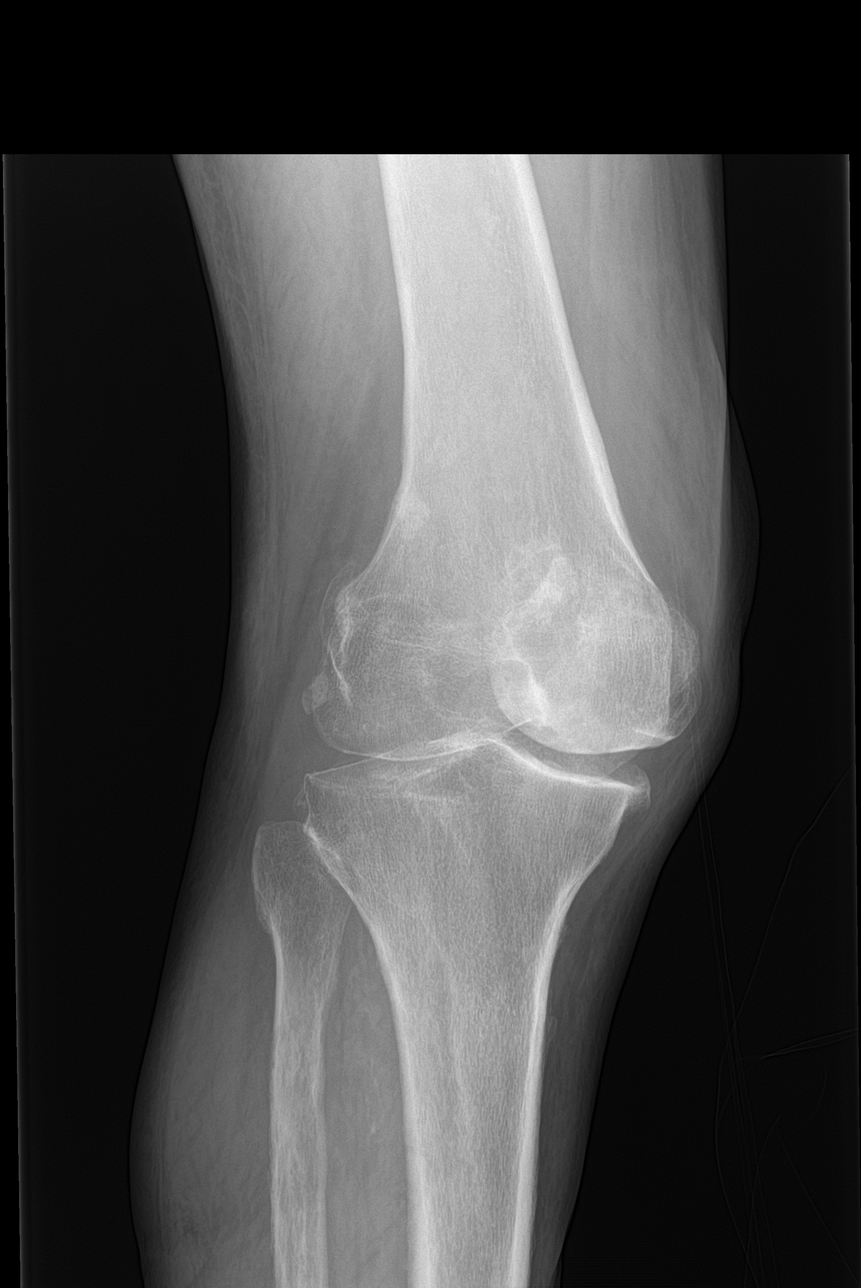

[4 of 4 positions shown; findings below may reference images not displayed]

FINDINGS: No recent fracture or dislocation is seen. There is soft tissue
fullness in the suprapatellar bursa suggesting moderate effusion.
Severe degenerative changes are noted with bony spurs and joint
space narrowing in the medial, lateral and patellofemoral
compartments. There is 1.6 cm calcific density in the lateral aspect
of suprapatellar bursa suggesting possible loose body. Arterial
calcifications are seen in the soft tissues.
IMPRESSION: No recent fracture or dislocation is seen in the right knee. Severe
degenerative changes are noted with bony spurs, joint space
narrowing and possibly loose bodies.

## 2022-05-26 ENCOUNTER — Ambulatory Visit (INDEPENDENT_AMBULATORY_CARE_PROVIDER_SITE_OTHER): Payer: Self-pay | Admitting: Family Medicine

## 2022-05-26 ENCOUNTER — Encounter: Payer: Self-pay | Admitting: Family Medicine

## 2022-05-26 VITALS — BP 155/78 | HR 69 | Ht 67.0 in | Wt 199.0 lb

## 2022-05-26 DIAGNOSIS — I4821 Permanent atrial fibrillation: Secondary | ICD-10-CM

## 2022-05-26 DIAGNOSIS — Z23 Encounter for immunization: Secondary | ICD-10-CM

## 2022-05-26 DIAGNOSIS — I5032 Chronic diastolic (congestive) heart failure: Secondary | ICD-10-CM

## 2022-05-26 DIAGNOSIS — J8410 Pulmonary fibrosis, unspecified: Secondary | ICD-10-CM

## 2022-05-26 DIAGNOSIS — I129 Hypertensive chronic kidney disease with stage 1 through stage 4 chronic kidney disease, or unspecified chronic kidney disease: Secondary | ICD-10-CM

## 2022-05-26 DIAGNOSIS — N183 Chronic kidney disease, stage 3 unspecified: Secondary | ICD-10-CM

## 2022-05-26 MED ORDER — FUROSEMIDE 20 MG PO TABS: 20.0000 mg | ORAL_TABLET | Freq: Every day | ORAL | 5 refills | Status: DC | PRN

## 2022-05-26 NOTE — Patient Instructions (Addendum)
Thank you for coming to the office today.  For Furosemide '20mg'$  fluid pill take only daily as needed if swelling and fluid.  I will refer you to our Chronic Care Management team - Grayland Ormond (Pharmacist) and Jeannene Patella (Nurse Case Manager)  They will call you sometime in the next 1-2 weeks to discuss and make arrangements for the medication to see what they can do to lower cost. You may be eligible for cost savings through Medicare.  For Oxygen we will try to submit orders to this company and hopefully it will be covered and they can make arrangements for portable oxygen, once you do then you can cancel the other orders.  If oxygen is <88% we can order. They can deliver. If it does not go below 88%, we will need to ask our team to contact [facility] to see if they can re order the portable oxygen.  [facility] Address: [address] [place], [place] [address] Phone: [phone]  Otherwise we may try this [place], [place] [address] Ph: [phone]  For the Fluid medication - Furosemide '20mg'$  daily as needed.   Please schedule a Follow-up Appointment to: Return in about 6 weeks (around 07/07/2022) for 6 week follow-up CHF, HTN, Diuretic, Anticoag AFib.  If you have any other questions or concerns, please feel free to call the office or send a message through [place]. You may also schedule an earlier appointment if necessary.  Additionally, you may be receiving a survey about your experience at our office within a few days to 1 week by e-mail or mail. We value your feedback.  Nobie Putnam, DO [place]

## 2022-05-26 NOTE — Progress Notes (Signed)
Subjective:    Patient ID: Robert [name], male    DOB: 1932/05/24, 88 y.o.   MRN: [id number]  Robert [name] is a 88 y.o. male presenting on 05/26/2022 for Hospitalization Follow-up and Shortness of Breath   HPI  ED FOLLOW-UP VISIT  Hospital/Location: [facility] ED Date of ED Visit: 05/03/22  Reason for Presenting to ED: Dyspnea CHF  FOLLOW-UP  - ED provider note and record have been reviewed - Patient presents today about 3 weeks after recent ED visit. Brief summary of recent course, patient had symptoms of dyspnea and edema, presented to ED on 9/27, testing in ED with imaging x-ray showed pulm edema, treated with IV lasix and discharged.  Needs help with Eliquis financial assistance  Home portable oxygen  - Today reports overall has done well after discharge from ED. Symptoms of edema have improved. Breathing has improved, continues on 2L home oxygen  - New medications on discharge: Furosemide   I have reviewed the discharge medication list, and have reconciled the current and discharge medications today.   Health Maintenance: Flu Shot today     08/23/2021   11:14 AM 03/10/2021    8:54 AM 07/06/2020    8:45 AM  Depression screen PHQ 2/9  Decreased Interest 0 0 0  Down, Depressed, Hopeless 0 0 0  PHQ - 2 Score 0 0 0  Altered sleeping 0 0   Tired, decreased energy 0 0   Change in appetite 0 0   Feeling bad or failure about yourself  0 0   Trouble concentrating 0 0   Moving slowly or fidgety/restless 0 0   Suicidal thoughts 0 0   PHQ-9 Score 0 0   Difficult doing work/chores Not difficult at all Not difficult at all     Social History   Tobacco Use   Smoking status: Former    Types: Cigarettes    Quit date: 1985    Years since quitting: 38.8   Smokeless tobacco: Former  Scientific laboratory technician Use: Never used  Substance Use Topics   Alcohol use: Never   Drug use: Never    Review of Systems Per HPI unless specifically indicated above     Objective:     BP (!) 155/78 (BP Location: Left Arm, Cuff Size: Normal)   Pulse 69   Ht _0  (1.702 m)   Wt 199 lb (90.3 kg)   SpO2 94%   BMI 31.17 kg/m   Wt Readings from Last 3 Encounters:  05/26/22 199 lb (90.3 kg)  02/24/22 195 lb (88.5 kg)  10/24/21 195 lb (88.5 kg)   Oxygen Documentation Resting at room air = 92% Walking at room air ( < 6 minute ) = desat to 85% Walking  with 2 L supplemental O2 = 94%  Physical Exam Vitals and nursing note reviewed.  Constitutional:      General: He is not in acute distress.    Appearance: He is well-developed. He is not diaphoretic.     Comments: Well-appearing, comfortable, cooperative  HENT:     Head: Normocephalic and atraumatic.  Eyes:     General:        Right eye: No discharge.        Left eye: No discharge.     Conjunctiva/sclera: Conjunctivae normal.  Neck:     Thyroid: No thyromegaly.  Cardiovascular:     Rate and Rhythm: Normal rate and regular rhythm.     Pulses: Normal pulses.  Heart sounds: Normal heart sounds. No murmur heard. Pulmonary:     Effort: Pulmonary effort is normal. No respiratory distress.     Breath sounds: Normal breath sounds. No wheezing or rales.  Musculoskeletal:        General: Normal range of motion.     Cervical back: Normal range of motion and neck supple.     Right lower leg: No edema.     Left lower leg: No edema.  Lymphadenopathy:     Cervical: No cervical adenopathy.  Skin:    General: Skin is warm and dry.     Findings: No erythema or rash.  Neurological:     Mental Status: He is alert and oriented to person, place, and time. Mental status is at baseline.  Psychiatric:        Behavior: Behavior normal.     Comments: Well groomed, good eye contact, normal speech and thoughts        Results for orders placed or performed in visit on 09/81/19  BASIC METABOLIC PANEL WITH GFR  Result Value Ref Range   Glucose, Bld 88 65 - 139 mg/dL   BUN 42 (H) 7 - 25 mg/dL   Creat 1.63 (H) 0.70 -  1.22 mg/dL   eGFR 41 (L) > OR = 60 mL/min/1.18m   BUN/Creatinine Ratio 26 (H) 6 - 22 (calc)   Sodium 139 135 - 146 mmol/L   Potassium 5.3 3.5 - 5.3 mmol/L   Chloride 105 98 - 110 mmol/L   CO2 29 20 - 32 mmol/L   Calcium 9.9 8.6 - 10.3 mg/dL  CBC with Differential/Platelet  Result Value Ref Range   WBC 15.9 (H) 3.8 - 10.8 Thousand/uL   RBC 5.43 4.20 - 5.80 Million/uL   Hemoglobin 13.3 13.2 - 17.1 g/dL   HCT 42.9 38.5 - 50.0 %   MCV 79.0 (L) 80.0 - 100.0 fL   MCH 24.5 (L) 27.0 - 33.0 pg   MCHC 31.0 (L) 32.0 - 36.0 g/dL   RDW 17.1 (H) 11.0 - 15.0 %   Platelets 204 140 - 400 Thousand/uL   MPV  7.5 - 12.5 fL   Neutro Abs 4,897 1,500 - 7,800 cells/uL   Lymphs Abs 8,888 (H) 850 - 3,900 cells/uL   Absolute Monocytes 1,717 (H) 200 - 950 cells/uL   Eosinophils Absolute 254 15 - 500 cells/uL   Basophils Absolute 143 0 - 200 cells/uL   Neutrophils Relative % 30.8 %   Total Lymphocyte 55.9 %   Monocytes Relative 10.8 %   Eosinophils Relative 1.6 %   Basophils Relative 0.9 %   Smear Review    Magnesium  Result Value Ref Range   Magnesium 2.1 1.5 - 2.5 mg/dL      Assessment & Plan:   Problem List Items Addressed This Visit     (HFpEF) heart failure with preserved ejection fraction (HCC) - Primary   Relevant Medications   furosemide (LASIX) 20 MG tablet   Other Relevant Orders   AMB Referral to Chronic Care Management Services   For home use only DME oxygen   Atrial fibrillation (HCC)   Relevant Medications   furosemide (LASIX) 20 MG tablet   Other Relevant Orders   AMB Referral to Chronic Care Management Services   Benign hypertension with CKD (chronic kidney disease) stage III (HCC)   Relevant Medications   furosemide (LASIX) 20 MG tablet   Other Relevant Orders   AMB Referral to Chronic Care Management Services   Pulmonary  fibrosis ([place])   Relevant Orders   For home use only DME oxygen   Other Visit Diagnoses     Needs flu shot       Relevant Orders   Flu Vaccine  QUAD High Dose(Fluad)      ED Follow-up from 05/03/22 [facility] Acute CHF exacerbation, HFpEF Pulmonary fibrosis On supplemental chronic oxygen due to hypoxia desaturation  He has home oxygen tank at 2L via nasal cannula continuous, he may skip wearing it sometimes but it helps him.  They need new portable oxygen concentrator  Oxygen Documentation Resting at room air = 92% Walking at room air ( < 6 minute ) = desat to 85% Walking  with 2 L supplemental O2 = 94%  Will need 2 L supplemental oxygen PRN with activity exertion  Will submit orders to [facility] to supply.  For CHF He came off fluid diuretic Furosemide, hospital gave him IV dose and discharge with short course. He will need new orders today for Furosemide 41m daily PRN Only, discussed with him and caregiver on dosing. [facility]  Persistent Atrial Fibrillation On Anticoagulation Eliquis 2.560mBID Advised him that given the cardiac etiology, I would at least prefer that they discuss with Cardiology before changes to medication. He has 1 month supply left, now in Medicare donut hole or coverage gap and high cost for medication for him. - I will place CCM referral to RN CM and Pharmacist for further evaluation of his needs, and possibly eligible for med assistance on anticoagulation. Based on his renal function Eliquis would be preferred, but would consider other options if indicated.   Orders Placed This Encounter  Procedures   For home use only DME oxygen    Need portable oxygen concentrator    Order Specific Question:   Length of Need    Answer:   12 Months    Order Specific Question:   Mode or (Route)    Answer:   Nasal cannula    Order Specific Question:   Oxygen conserving device    Answer:   Yes    Order Specific Question:   Oxygen delivery system    Answer:   Gas   Flu Vaccine QUAD High Dose(Fluad)   AMB Referral to Chronic Care Management Services     Referral Priority:   Routine    Referral Type:   Consultation    Referral Reason:   Care Coordination    Number of Visits Requested:   1     Meds ordered this encounter  Medications   furosemide (LASIX) 20 MG tablet    Sig: Take 1 tablet (20 mg total) by mouth daily as needed for fluid or edema.    Dispense:  30 tablet    Refill:  5     Follow up plan: Return in about 6 weeks (around 07/07/2022) for 6 week follow-up CHF, HTN, Diuretic, Anticoag AFib.   AlNobie PutnamDOArnettedical Group 05/26/2022, 10:50 AM

## 2022-06-05 ENCOUNTER — Encounter (INDEPENDENT_AMBULATORY_CARE_PROVIDER_SITE_OTHER): Payer: Self-pay

## 2022-06-20 ENCOUNTER — Telehealth: Payer: Self-pay

## 2022-06-20 NOTE — Progress Notes (Signed)
  Chronic Care Management   Note  06/20/2022 Name: Robert [name] MRN: [id number] DOB: 1932/02/20  Robert [name] is a 88 y.o. year old male who is a primary care patient of Olin Hauser, DO. I reached out to [employer]  by phone today in response to a referral sent by Robert [name]'s PCP.  Robert [name]  agreedto scheduling an appointment with the CCM RN Case Manager and Pharm D    Follow up plan: Patient agreed to scheduled appointment with RN Case Manager on 06/22/2022 and Pharm D 06/26/2022.   Noreene Larsson, [place], [place] [address] Direct Dial: [phone] [email or web address]

## 2022-06-22 ENCOUNTER — Telehealth: Payer: PPO

## 2022-06-22 ENCOUNTER — Ambulatory Visit (INDEPENDENT_AMBULATORY_CARE_PROVIDER_SITE_OTHER): Payer: PPO

## 2022-06-22 DIAGNOSIS — I4821 Permanent atrial fibrillation: Secondary | ICD-10-CM

## 2022-06-22 DIAGNOSIS — I5032 Chronic diastolic (congestive) heart failure: Secondary | ICD-10-CM

## 2022-06-22 NOTE — Plan of Care (Signed)
Chronic Care Management Provider Comprehensive Care Plan    06/22/2022 Name: Robert [name] MRN: [id number] DOB: [birth year]  Referral to Chronic Care Management (CCM) services was placed by Provider:  Dr. Parks Ranger on Date: 05-26-2022.  Chronic Condition 1: AFIB Provider Assessment and Plan  Relevant Medications     furosemide (LASIX) 20 MG tablet    Other Relevant Orders    AMB Referral to Chronic Care Management Services     Expected Outcome/Goals Addressed This Visit (Provider CCM goals/Provider Assessment and plan   CCM (AFIB)  EXPECTED OUTCOME:  MONITOR, SELF- MANAGE AND REDUCE SYMPTOMS OF AFIB   Symptom Management Condition 1: Take all medications as prescribed Attend all scheduled provider appointments Call provider office for new concerns or questions  call the Suicide and Crisis Lifeline: 988 call the [place] National Suicide Prevention Lifeline: (423) 595-9304 or TTY: 870-179-7371 TTY 316-276-0985) to talk to a trained counselor call 1-800-273-TALK (toll free, 24 hour hotline) if experiencing a Mental Health or [place]  make a plan to eat healthy keep all lab appointments take medicine as prescribed  Chronic Condition 2: HF Provider Assessment and Plan  Relevant Medications     furosemide (LASIX) 20 MG tablet    Other Relevant Orders    AMB Referral to Chronic Care Management Services    For home use only DME oxygen     Expected Outcome/Goals Addressed This Visit (Provider CCM goals/Provider Assessment and plan   CCM (HEART FAILURE)  EXPECTED OUTCOME:  MONITOR, SELF-MANAGE AND REDUCE SYMPTOMS OF HEART FAILURE   Symptom Management Condition 2: Take all medications as prescribed Attend all scheduled provider appointments Call provider office for new concerns or questions  call the Suicide and Crisis Lifeline: 988 call the [place] National Suicide Prevention Lifeline: 8172425583 or TTY: (805) 441-6486 [place] (603) 377-0216) to talk to a trained  counselor call 1-800-273-TALK (toll free, 24 hour hotline) if experiencing a Mental Health or [place]  call office if I gain more than 2 pounds in one day or 5 pounds in one week track weight in diary use salt in moderation watch for swelling in feet, ankles and legs every day weigh myself daily begin a heart failure diary bring diary to all appointments develop a rescue plan follow rescue plan if symptoms flare-up track symptoms and what helps feel better or worse dress right for the weather, hot or cold  Problem List Patient Active Problem List   Diagnosis Date Noted   Pulmonary fibrosis ([place]) 03/23/2022   Muscle weakness (generalized) 08/26/2021   Unsteadiness on feet 08/26/2021   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 failure and stage 1 through stage 4 chronic kidney disease, or unspecified chronic kidney disease ([place]) 08/26/2021   Dependence on supplemental oxygen 32/07/2481   Acute diastolic (congestive) heart failure ([place]) 08/26/2021   Chronic kidney disease, stage 3 unspecified ([place]) 08/26/2021   (HFpEF) heart failure with preserved ejection fraction ([place]) 08/25/2021   Pulmonary hypertension ([place]) 08/19/2021   Personal history of nicotine dependence 08/07/2020   Long term (current) use of anticoagulants 08/07/2020   OSA (obstructive sleep apnea) 01/09/2020   Primary osteoarthritis of right knee 11/26/2019   Chronic pain of right knee 11/26/2019   Idiopathic chronic gout of right knee without tophus 11/26/2019   CLL (chronic lymphocytic leukemia) ([place]) 09/17/2019   Atrial fibrillation ([place]) 09/17/2019   Red blood cell antibody positive 08/31/2019   COVID-19 virus infection 08/30/2019   Neutrophilia 05/27/2019   Chronic venous insufficiency 05/26/2019  Lymphedema of lower extremity 05/09/2018   Osteoarthritis of multiple joints 03/05/2018   CKD (chronic kidney disease), stage III ([place]) 03/01/2018   Pre-diabetes 03/01/2018   Benign  hypertension with CKD (chronic kidney disease) stage III ([facility]) 11/23/2017   Gout 11/23/2017   Hyperlipidemia 11/23/2017    Medication Management  Current Outpatient Medications:    allopurinol (ZYLOPRIM) 100 MG tablet, Take 1 tablet (100 mg total) by mouth daily., Disp: 90 tablet, Rfl: 3   apixaban (ELIQUIS) 2.5 MG TABS tablet, Take 1 tablet (2.5 mg total) by mouth 2 (two) times daily., Disp: 60 tablet, Rfl: 6   diclofenac Sodium (VOLTAREN) 1 % GEL, Apply 2 g topically 4 (four) times daily as needed (right knee arthritis pain)., Disp: 100 g, Rfl: 5   fluticasone (FLONASE ALLERGY RELIEF) 50 MCG/ACT nasal spray, 2 spray DAILY (route: nasal), Disp: , Rfl:    fluticasone (FLONASE) 50 MCG/ACT nasal spray, Place 2 sprays into both nostrils daily. Use for 4-6 weeks then stop and use seasonally or as needed., Disp: 16 g, Rfl: 1   furosemide (LASIX) 20 MG tablet, Take 1 tablet (20 mg total) by mouth daily as needed for fluid or edema., Disp: 30 tablet, Rfl: 5   gabapentin (NEURONTIN) 100 MG capsule, Start 1 capsule daily, increase by 1 cap every 2-3 days as tolerated up to 3 times a day, or may take 3 at once in evening., Disp: 90 capsule, Rfl: 1   hydrochlorothiazide (HYDRODIURIL) 25 MG tablet, 1 tablet DAILY (route: oral), Disp: , Rfl:    Melatonin 10 MG TABS, Take 10 mg by mouth at bedtime., Disp: , Rfl:    metoprolol succinate (TOPROL-XL) 25 MG 24 hr tablet, Take 1 tablet (25 mg total) by mouth daily., Disp: 90 tablet, Rfl: 3   traZODone (DESYREL) 50 MG tablet, Take 1 tablet (50 mg total) by mouth at bedtime., Disp: 90 tablet, Rfl: 1   triamcinolone cream (KENALOG) 0.5 %, Apply 1 application topically 2 (two) times daily. To affected areas, for up to 2 weeks., Disp: 30 g, Rfl: 2  Cognitive Assessment Identity Confirmed: : Name; DOB Cognitive Status: Normal Other:  : HIPPA verified on behalf of the patients son, Tyrese Capriotti   Functional Assessment Hearing Difficulty or Deaf: yes Hearing  Management: son states the patient is hard of hearing but the patient does not feel he has issues Wear Glasses or Blind: yes Vision Management: reading glasses only Concentrating, Remembering or Making Decisions Difficulty (CP): no Difficulty Communicating: no Difficulty Eating/Swallowing: no Walking or Climbing Stairs Difficulty: no Dressing/Bathing Difficulty: no Doing Errands Independently Difficulty (such as shopping) (CP): no Change in Functional Status Since Onset of Current Illness/Injury: no   Caregiver Assessment  Primary Source of Support/Comfort: child(ren) Name of Support/Comfort Primary Source: Yitzchok Marcum and niece Randall An People in Home: child(ren), adult; sibling(s) (the patient and his brother live with the patients son, Advertising account planner) Name(s) of People in Home: Advertising account planner and the patients brother Family Caregiver if Needed: child(ren), adult Family Caregiver Names: Advertising account planner and Building services engineer Primary Roles/Responsibilities: retired Expected Impact of Illness/Hospitalization: does not want to have to go back to the hospital for shortness of breath   Planned Interventions  Provider order and care plan reviewed. Collaborated with PharmD regarding patient care and plan. Counseled on increased risk of stroke due to Afib and benefits of anticoagulation for stroke prevention           Reviewed importance of adherence to anticoagulant exactly as prescribed Advised patient to discuss changes  in AFIB with provider Counseled on bleeding risk associated with AFIB and importance of self-monitoring for signs/symptoms of bleeding Counseled on avoidance of NSAIDs due to increased bleeding risk with anticoagulants Counseled on importance of regular laboratory monitoring as prescribed Counseled on seeking medical attention after a head injury or if there is blood in the urine/stool Afib action plan reviewed The patient has upcoming appointment with the pharm D for medication cost effective measures and  medications management . Screening for signs and symptoms of depression related to chronic disease state Assessed social determinant of health barriers Basic overview and discussion of pathophysiology of Heart Failure reviewed Provided education on low sodium diet. Education on the patient monitoring salt intake. Review of making sure the patient does not drink excess amounts of fluid. The patients son states sometimes it is hard to keep the patient from eating salt.  Reviewed Heart Failure Action Plan in depth and provided written copy. Will send information through my Chart Assessed need for readable accurate scales in home Provided education about placing scale on hard, flat surface Advised patient to weigh each morning after emptying bladder Discussed importance of daily weight and advised patient to weigh and record daily. The patients son states that the patient weighs daily. Weight is up to 204, normally good dry weight is 198. Education on calling the provider for 2/3 + weight gain in one day or 5 pounds in one week. Education on weight being a key factor in helping with heart failure exacerbation. Reviewed role of diuretics in prevention of fluid overload and management of heart failure Discussed the importance of keeping all appointments with provider. Education on the recommendation of getting an appointment for follow up with the cardiologist at [facility] Provided patient with education about the role of exercise in the management of heart failure Advised patient to discuss parameters of taking Lasix, questions and concerns about heart failure and effective management of heart failure with provider The patient has his portable oxygen now. The patients son states they had to go with a new DME provider but have this now and the patient is using as prescribed. EF 65%-(08-2021) explained what the EF% was and how heart failure can change and progress Screening for signs and symptoms of depression  related to chronic disease state  Assessed social determinant of health barriers   Interaction and coordination with outside resources, practitioners, and providers See CCM Referral  Care Plan: Available in MyChart

## 2022-06-22 NOTE — Patient Instructions (Addendum)
Please call the care guide team at [phone] if you need to cancel or reschedule your appointment.   If you are experiencing a Mental Health or [place] or need someone to talk to, please call the Suicide and Crisis Lifeline: 988 call the [place] National Suicide Prevention Lifeline: 8625282268 or TTY: 309 617 7924 TTY (719)530-2808) to talk to a trained counselor call 1-800-273-TALK (toll free, 24 hour hotline)   Following is a copy of your full provider care plan:   Goals Addressed             This Visit's Progress    CCM Expected Outcome:  Monitor, Self-Manage and Reduce Symptoms of Afib       Current Barriers:  Knowledge Deficits related to medications that control AFIB Care Coordination needs related to cost effective medications to control AFIB and cost constraints in a patient with AFIB Chronic Disease Management support and education needs related to effective management of AFIB Financial Constraints.   Planned Interventions: Provider order and care plan reviewed. Collaborated with PharmD regarding patient care and plan. Counseled on increased risk of stroke due to Afib and benefits of anticoagulation for stroke prevention           Reviewed importance of adherence to anticoagulant exactly as prescribed Advised patient to discuss changes in AFIB with provider Counseled on bleeding risk associated with AFIB and importance of self-monitoring for signs/symptoms of bleeding Counseled on avoidance of NSAIDs due to increased bleeding risk with anticoagulants Counseled on importance of regular laboratory monitoring as prescribed Counseled on seeking medical attention after a head injury or if there is blood in the urine/stool Afib action plan reviewed The patient has upcoming appointment with the pharm D for medication cost effective measures and medications management . Screening for signs and symptoms of depression related to chronic disease state Assessed  social determinant of health barriers  Symptom Management: Take medications as prescribed   Attend all scheduled provider appointments Call provider office for new concerns or questions  call the Suicide and Crisis Lifeline: 988 call the [place] National Suicide Prevention Lifeline: 7720136520 or TTY: (220)858-9012 TTY 509 052 1647) to talk to a trained counselor call 1-800-273-TALK (toll free, 24 hour hotline) if experiencing a Mental Health or [place]  - make a plan to exercise regularly - make a plan to eat healthy - keep all lab appointments - take medicine as prescribed  Follow Up Plan: Telephone follow up appointment with care management team member scheduled for: 08-03-2022 at 0945 am       CCM Expected Outcome:  Monitor, Self-Manage and Reduce Symptoms of Heart Failure       Current Barriers:  Knowledge Deficits related to parameters to take Lasix, monitoring for changes in heart failure and plan of care for heart failure Care Coordination needs related to resources and education needs for managing heart failure in a patient with heart failure Chronic Disease Management support and education needs related to effective management of heart failure  Planned Interventions: Basic overview and discussion of pathophysiology of Heart Failure reviewed Provided education on low sodium diet. Education on the patient monitoring salt intake. Review of making sure the patient does not drink excess amounts of fluid. The patients son states sometimes it is hard to keep the patient from eating salt.  Reviewed Heart Failure Action Plan in depth and provided written copy. Will send information through my Chart Assessed need for readable accurate scales in home Provided education about placing scale on hard, flat  surface Advised patient to weigh each morning after emptying bladder Discussed importance of daily weight and advised patient to weigh and record daily. The patients son  states that the patient weighs daily. Weight is up to 204, normally good dry weight is 198. Education on calling the provider for 2/3 + weight gain in one day or 5 pounds in one week. Education on weight being a key factor in helping with heart failure exacerbation. Reviewed role of diuretics in prevention of fluid overload and management of heart failure Discussed the importance of keeping all appointments with provider. Education on the recommendation of getting an appointment for follow up with the cardiologist at [facility] Provided patient with education about the role of exercise in the management of heart failure Advised patient to discuss parameters of taking Lasix, questions and concerns about heart failure and effective management of heart failure with provider The patient has his portable oxygen now. The patients son states they had to go with a new DME provider but have this now and the patient is using as prescribed. EF 65%-(08-2021) explained what the EF% was and how heart failure can change and progress Screening for signs and symptoms of depression related to chronic disease state  Assessed social determinant of health barriers  Symptom Management: Take medications as prescribed   Attend all scheduled provider appointments Call provider office for new concerns or questions  call the Suicide and Crisis Lifeline: 988 call the [place] National Suicide Prevention Lifeline: (646)655-5921 or TTY: 614 185 5863 TTY (906)088-8446) to talk to a trained counselor call 1-800-273-TALK (toll free, 24 hour hotline) if experiencing a Mental Health or [place]  call office if I gain more than 2 pounds in one day or 5 pounds in one week track weight in diary use salt in moderation watch for swelling in feet, ankles and legs every day weigh myself daily develop a rescue plan follow rescue plan if symptoms flare-up eat more whole grains, fruits and vegetables, lean meats and healthy  fats track symptoms and what helps feel better or worse dress right for the weather, hot or cold  Follow Up Plan: Telephone follow up appointment with care management team member scheduled for: 08-03-2022 at 0945 am          Patient verbalizes understanding of instructions and care plan provided today and agrees to view in [place]. Active MyChart status and patient understanding of how to access instructions and care plan via MyChart confirmed with patient.     Telephone follow up appointment with care management team member scheduled for: 08-03-2022 at 0945 am  DASH Eating Plan [place] stands for Dietary Approaches to Stop Hypertension. The DASH eating plan is a healthy eating plan that has been shown to: Reduce high blood pressure (hypertension). Reduce your risk for type 2 diabetes, heart disease, and stroke. Help with weight loss. What are tips for following this plan? Reading food labels Check food labels for the amount of salt (sodium) per serving. Choose foods with less than 5 percent of the Daily Value of sodium. Generally, foods with less than 300 milligrams (mg) of sodium per serving fit into this eating plan. To find whole grains, look for the word "whole" as the first word in the ingredient list. Shopping Buy products labeled as "low-sodium" or "no salt added." Buy fresh foods. Avoid canned foods and pre-made or frozen meals. Cooking Avoid adding salt when cooking. Use salt-free seasonings or herbs instead of table salt or sea salt. Check with your  health care provider or pharmacist before using salt substitutes. Do not fry foods. Cook foods using healthy methods such as baking, boiling, grilling, roasting, and broiling instead. Cook with heart-healthy oils, such as olive, canola, avocado, soybean, or sunflower oil. Meal planning  Eat a balanced diet that includes: 4 or more servings of fruits and 4 or more servings of vegetables each day. Try to fill one-half of your plate  with fruits and vegetables. 6-8 servings of whole grains each day. Less than 6 oz (170 g) of lean meat, poultry, or fish each day. A 3-oz (85-g) serving of meat is about the same size as a deck of cards. One egg equals 1 oz (28 g). 2-3 servings of low-fat dairy each day. One serving is 1 cup (237 mL). 1 serving of nuts, seeds, or beans 5 times each week. 2-3 servings of heart-healthy fats. Healthy fats called omega-3 fatty acids are found in foods such as walnuts, flaxseeds, fortified milks, and eggs. These fats are also found in cold-water fish, such as sardines, salmon, and mackerel. Limit how much you eat of: Canned or prepackaged foods. Food that is high in trans fat, such as some fried foods. Food that is high in saturated fat, such as fatty meat. Desserts and other sweets, sugary drinks, and other foods with added sugar. Full-fat dairy products. Do not salt foods before eating. Do not eat more than 4 egg yolks a week. Try to eat at least 2 vegetarian meals a week. Eat more home-cooked food and less restaurant, buffet, and fast food. Lifestyle When eating at a restaurant, ask that your food be prepared with less salt or no salt, if possible. If you drink alcohol: Limit how much you use to: 0-1 drink a day for women who are not pregnant. 0-2 drinks a day for men. Be aware of how much alcohol is in your drink. In the [place] one drink equals one 12 oz bottle of beer (355 mL), one 5 oz glass of wine (148 mL), or one 1 oz glass of hard liquor (44 mL). General information Avoid eating more than 2,300 mg of salt a day. If you have hypertension, you may need to reduce your sodium intake to 1,500 mg a day. Work with your health care provider to maintain a healthy body weight or to lose weight. Ask what an ideal weight is for you. Get at least 30 minutes of exercise that causes your heart to beat faster (aerobic exercise) most days of the week. Activities may include walking, swimming, or  biking. Work with your health care provider or dietitian to adjust your eating plan to your individual calorie needs. What foods should I eat? Fruits All fresh, dried, or frozen fruit. Canned fruit in natural juice (without added sugar). Vegetables Fresh or frozen vegetables (raw, steamed, roasted, or grilled). Low-sodium or reduced-sodium tomato and vegetable juice. Low-sodium or reduced-sodium tomato sauce and tomato paste. Low-sodium or reduced-sodium canned vegetables. Grains Whole-grain or whole-wheat bread. Whole-grain or whole-wheat pasta. Brown rice. [place]. Bulgur. Whole-grain and low-sodium cereals. Pita bread. Low-fat, low-sodium crackers. Whole-wheat flour tortillas. Meats and other proteins Skinless chicken or [place]. Ground chicken or [place]. Pork with fat trimmed off. Fish and seafood. Egg whites. Dried beans, peas, or lentils. Unsalted nuts, nut butters, and seeds. Unsalted canned beans. Lean cuts of beef with fat trimmed off. Low-sodium, lean precooked or cured meat, such as sausages or meat loaves. Dairy Low-fat (1%) or fat-free (skim) milk. Reduced-fat, low-fat, or fat-free cheeses. Nonfat,  low-sodium ricotta or cottage cheese. Low-fat or nonfat yogurt. Low-fat, low-sodium cheese. Fats and oils Soft margarine without trans fats. Vegetable oil. Reduced-fat, low-fat, or light mayonnaise and salad dressings (reduced-sodium). Canola, safflower, olive, avocado, soybean, and sunflower oils. Avocado. Seasonings and condiments Herbs. Spices. Seasoning mixes without salt. Other foods Unsalted popcorn and pretzels. Fat-free sweets. The items listed above may not be a complete list of foods and beverages you can eat. Contact a dietitian for more information. What foods should I avoid? Fruits Canned fruit in a light or heavy syrup. Fried fruit. Fruit in cream or butter sauce. Vegetables Creamed or fried vegetables. Vegetables in a cheese sauce. Regular canned vegetables (not  low-sodium or reduced-sodium). Regular canned tomato sauce and paste (not low-sodium or reduced-sodium). Regular tomato and vegetable juice (not low-sodium or reduced-sodium). Angie Fava. Olives. Grains Baked goods made with fat, such as croissants, muffins, or some breads. Dry pasta or rice meal packs. Meats and other proteins Fatty cuts of meat. Ribs. Fried meat. Berniece Salines. Bologna, salami, and other precooked or cured meats, such as sausages or meat loaves. Fat from the back of a pig (fatback). Bratwurst. Salted nuts and seeds. Canned beans with added salt. Canned or smoked fish. Whole eggs or egg yolks. Chicken or [place] with skin. Dairy Whole or 2% milk, cream, and half-and-half. Whole or full-fat cream cheese. Whole-fat or sweetened yogurt. Full-fat cheese. Nondairy creamers. Whipped toppings. Processed cheese and cheese spreads. Fats and oils Butter. Stick margarine. Lard. Shortening. Ghee. Bacon fat. Tropical oils, such as coconut, palm kernel, or palm oil. Seasonings and condiments Onion salt, garlic salt, seasoned salt, table salt, and sea salt. Worcestershire sauce. Tartar sauce. Barbecue sauce. Teriyaki sauce. Soy sauce, including reduced-sodium. Steak sauce. Canned and packaged gravies. Fish sauce. Oyster sauce. Cocktail sauce. Store-bought horseradish. Ketchup. Mustard. Meat flavorings and tenderizers. Bouillon cubes. Hot sauces. Pre-made or packaged marinades. Pre-made or packaged taco seasonings. Relishes. Regular salad dressings. Other foods Salted popcorn and pretzels. The items listed above may not be a complete list of foods and beverages you should avoid. Contact a dietitian for more information. Where to find more information National Heart, Lung, and Blood Institute: https://wilson-eaton.com/ American Heart Association: www.heart.org Academy of Nutrition and Dietetics: www.eatright.[place]: www.kidney.org Summary The DASH eating plan is a healthy eating plan that  has been shown to reduce high blood pressure (hypertension). It may also reduce your risk for type 2 diabetes, heart disease, and stroke. When on the DASH eating plan, aim to eat more fresh fruits and vegetables, whole grains, lean proteins, low-fat dairy, and heart-healthy fats. With the DASH eating plan, you should limit salt (sodium) intake to 2,300 mg a day. If you have hypertension, you may need to reduce your sodium intake to 1,500 mg a day. Work with your health care provider or dietitian to adjust your eating plan to your individual calorie needs. This information is not intended to replace advice given to you by your health care provider. Make sure you discuss any questions you have with your health care provider. Document Revised: 06/27/2019 Document Reviewed: 06/27/2019 Elsevier Patient Education  [place].  Heart Failure Action Plan A heart failure action plan helps you understand what to do when you have symptoms of heart failure. Your action plan is a color-coded plan that lists the symptoms to watch for and indicates what actions to take. If you have symptoms in the red zone, you need medical care right away. If you have symptoms in the yellow  zone, you are having problems. If you have symptoms in the green zone, you are doing well. Follow the plan that was created by you and your health care provider. Review your plan each time you visit your health care provider. Red zone These signs and symptoms mean you should get medical help right away: You have trouble breathing when resting. You have a dry cough that is getting worse. You have swelling or pain in your legs or abdomen that is getting worse. You suddenly gain more than 2-3 lb (0.9-1.4 kg) in 24 hours, or more than 5 lb (2.3 kg) in a week. This amount may be more or less depending on your condition. You have trouble staying awake or you feel confused. You have chest pain. You do not have an appetite. You pass  out. You have worsening sadness or depression. If you have any of these symptoms, call your local emergency services (911 in the [place] right away. Do not drive yourself to the hospital. Yellow zone These signs and symptoms mean your condition may be getting worse and you should make some changes: You have trouble breathing when you are active, or you need to sleep with your head raised on extra pillows to help you breathe. You have swelling in your legs or abdomen. You gain 2-3 lb (0.9-1.4 kg) in 24 hours, or 5 lb (2.3 kg) in a week. This amount may be more or less depending on your condition. You get tired easily. You have trouble sleeping. You have a dry cough. If you have any of these symptoms: Contact your health care provider within the next day. Your health care provider may adjust your medicines. Green zone These signs mean you are doing well and can continue what you are doing: You do not have shortness of breath. You have very little swelling or no new swelling. Your weight is stable (no gain or loss). You have a normal activity level. You do not have chest pain or any other new symptoms. Follow these instructions at home: Take over-the-counter and prescription medicines only as told by your health care provider. Weigh yourself daily. Your target weight is __________ lb (__________ kg). Call your health care provider if you gain more than __________ lb (__________ kg) in 24 hours, or more than __________ lb (__________ kg) in a week. Health care provider name: _____________________________________________________ Health care provider phone number: _____________________________________________________ Eat a heart-healthy diet. Work with a diet and nutrition specialist (dietitian) to create an eating plan that is best for you. Keep all follow-up visits. This is important. Where to find more information American Heart Association: Summary A heart failure action plan helps you  understand what to do when you have symptoms of heart failure. Follow the action plan that was created by you and your health care provider. Get help right away if you have any symptoms in the red zone. This information is not intended to replace advice given to you by your health care provider. Make sure you discuss any questions you have with your health care provider. Document Revised: 11/01/2021 Document Reviewed: 03/08/2020 Elsevier Patient Education  [place].   Furosemide Tablets What is this medication? FUROSEMIDE (fyoor [place] se mide) treats high blood pressure. It may also be used to reduce swelling related to heart, kidney, or liver disease. It helps your kidneys remove more fluid and salt from your blood through the urine. It belongs to a group of medications called diuretics. This medicine may be used for other  purposes; ask your health care provider or pharmacist if you have questions. COMMON BRAND NAME(S): Active-Medicated Specimen Kit, Delone, Diuscreen, Lasix, RX Specimen Collection Kit, Specimen Collection Kit, URINX Medicated Specimen Collection What should I tell my care team before I take this medication? They need to know if you have any of these conditions: Diarrhea or vomiting Gout Heart disease High or low levels of electrolytes, such as magnesium, potassium, or sodium in your blood Kidney disease, small amounts of urine, or difficulty passing urine Liver disease Thyroid disease An unusual or allergic reaction to furosemide, sulfa medications, other medications, foods, dyes, or preservatives Pregnant or trying to get pregnant Breast-feeding How should I use this medication? Take this medication by mouth. Take it as directed on the prescription label at the same time every day. You can take it with or without food. If it upsets your stomach, take it with food. Keep taking it unless your care team tells you to stop. Talk to your care team about the use of this  medication in children. Special care may be needed. Overdosage: If you think you have taken too much of this medicine contact a poison control center or emergency room at once. NOTE: This medicine is only for you. Do not share this medicine with others. What if I miss a dose? If you miss a dose, take it as soon as you can. If it is almost time for your next dose, take only that dose. Do not take double or extra doses. What may interact with this medication? Aspirin and aspirin-like medications Certain antibiotics Chloral hydrate Cisplatin Cyclosporine Digoxin Diuretics Laxatives Lithium Medications for blood pressure Medications that relax muscles for surgery Methotrexate NSAIDs, medications for pain and inflammation, such as ibuprofen, naproxen, or indomethacin Phenytoin Steroid medications, such as prednisone or cortisone Sucralfate Thyroid hormones This list may not describe all possible interactions. Give your health care provider a list of all the medicines, herbs, non-prescription drugs, or dietary supplements you use. Also tell them if you smoke, drink alcohol, or use illegal drugs. Some items may interact with your medicine. What should I watch for while using this medication? Visit your care team for regular checks on your progress. Tell your care team if your symptoms do not start to get better or if they get worse. Check your blood pressure as directed. Know what your blood pressure should be and when to contact your care team. This medication may increase the amount of sugar in blood or urine. The risk may be higher in patients who already have diabetes. Ask your care team what you can do to lower your risk of diabetes while taking this medication. You may need to be on a special diet while taking this medication. Check with your care team. Also, ask how many glasses of fluid you need to drink a day. You must not get dehydrated. This medication may affect your coordination,  reaction time, or judgment. Do not drive or operate machinery until you know how this medication affects you. Sit up or stand slowly to reduce the risk of dizzy or fainting spells. Drinking alcohol with this medication can increase the risk of these side effects. This medication can make you more sensitive to the sun. Keep out of the sun. If you cannot avoid being in the sun, wear protective clothing and use sunscreen. Do not use sun lamps or tanning beds/booths. Check with your care team if you have severe diarrhea, nausea, and vomiting, or if you sweat a lot.  The loss of too much body fluid may make it dangerous for you to take this medication. What side effects may I notice from receiving this medication? Side effects that you should report to your care team as soon as possible: Allergic reactions--skin rash, itching, hives, swelling of the face, lips, tongue, or throat Dehydration--increased thirst, dry mouth, feeling faint or lightheaded, headache, dark yellow or brown urine Hearing loss, ringing in ears High blood sugar (hyperglycemia)--increased thirst or amount of urine, unusual weakness or fatigue, blurry vision Low blood pressure--dizziness, feeling faint or lightheaded, blurry vision Low potassium level--muscle pain or cramps, unusual weakness or fatigue, fast or irregular heartbeat, constipation Side effects that usually do not require medical attention (report to your care team if they continue or are bothersome): Burning or tingling sensation in hands or feet Constipation Diarrhea Dizziness Headache This list may not describe all possible side effects. Call your doctor for medical advice about side effects. You may report side effects to FDA at 1-800-FDA-1088. Where should I keep my medication? Keep out of the reach of children and pets. Store at room temperature between 20 and 25 degrees C (68 and 77 degrees F). Protect from light and moisture. Keep the container tightly closed.  Throw away any unused medication after the expiration date. NOTE: This sheet is a summary. It may not cover all possible information. If you have questions about this medicine, talk to your doctor, pharmacist, or health care provider.  2023 Elsevier/Gold Standard (2020-10-29 00:00:00)

## 2022-06-22 NOTE — Chronic Care Management (AMB) (Signed)
Chronic Care Management   CCM RN Visit Note  06/22/2022 Name: Tyjay Galindo MRN: [id number] DOB: 1931/10/29  Subjective: Robert [name] is a 88 y.o. year old male who is a primary care patient of Olin Hauser, DO. The patient was referred to the Chronic Care Management team for assistance with care management needs subsequent to provider initiation of CCM services and plan of care.    Today's Visit:   spoke to the patient son and DRP- Carney Krakowski  for initial visit.     SDOH Interventions Today    Flowsheet Row Most Recent Value  SDOH Interventions   Food Insecurity Interventions Intervention Not Indicated  Housing Interventions Intervention Not Indicated  Transportation Interventions Intervention Not Indicated  Utilities Interventions Intervention Not Indicated  Alcohol Usage Interventions Intervention Not Indicated (Score <7)  Financial Strain Interventions Intervention Not Indicated  Physical Activity Interventions Other (Comments)  [the patient does not do structured activity, encouraged activity]  Stress Interventions Intervention Not Indicated  Social Connections Interventions Intervention Not Indicated, Other (Comment)  [has good support from his family]         Goals Addressed             This Visit's Progress    CCM Expected Outcome:  Monitor, Self-Manage and Reduce Symptoms of Afib       Current Barriers:  Knowledge Deficits related to medications that control AFIB Care Coordination needs related to cost effective medications to control AFIB and cost constraints in a patient with AFIB Chronic Disease Management support and education needs related to effective management of AFIB Financial Constraints.   Planned Interventions: Provider order and care plan reviewed. Collaborated with PharmD regarding patient care and plan. Counseled on increased risk of stroke due to Afib and benefits of anticoagulation for stroke prevention           Reviewed  importance of adherence to anticoagulant exactly as prescribed Advised patient to discuss changes in AFIB with provider Counseled on bleeding risk associated with AFIB and importance of self-monitoring for signs/symptoms of bleeding Counseled on avoidance of NSAIDs due to increased bleeding risk with anticoagulants Counseled on importance of regular laboratory monitoring as prescribed Counseled on seeking medical attention after a head injury or if there is blood in the urine/stool Afib action plan reviewed The patient has upcoming appointment with the pharm D for medication cost effective measures and medications management . Screening for signs and symptoms of depression related to chronic disease state Assessed social determinant of health barriers  Symptom Management: Take medications as prescribed   Attend all scheduled provider appointments Call provider office for new concerns or questions  call the Suicide and Crisis Lifeline: 988 call the [place] National Suicide Prevention Lifeline: (435) 313-8367 or TTY: (416)287-6071 TTY 604-403-8799) to talk to a trained counselor call 1-800-273-TALK (toll free, 24 hour hotline) if experiencing a Mental Health or [place]  - make a plan to exercise regularly - make a plan to eat healthy - keep all lab appointments - take medicine as prescribed  Follow Up Plan: Telephone follow up appointment with care management team member scheduled for: 08-03-2022 at 0945 am       CCM Expected Outcome:  Monitor, Self-Manage and Reduce Symptoms of Heart Failure       Current Barriers:  Knowledge Deficits related to parameters to take Lasix, monitoring for changes in heart failure and plan of care for heart failure Care Coordination needs related to resources and education needs for managing heart  failure in a patient with heart failure Chronic Disease Management support and education needs related to effective management of heart  failure  Planned Interventions: Basic overview and discussion of pathophysiology of Heart Failure reviewed Provided education on low sodium diet. Education on the patient monitoring salt intake. Review of making sure the patient does not drink excess amounts of fluid. The patients son states sometimes it is hard to keep the patient from eating salt.  Reviewed Heart Failure Action Plan in depth and provided written copy. Will send information through my Chart Assessed need for readable accurate scales in home Provided education about placing scale on hard, flat surface Advised patient to weigh each morning after emptying bladder Discussed importance of daily weight and advised patient to weigh and record daily. The patients son states that the patient weighs daily. Weight is up to 204, normally good dry weight is 198. Education on calling the provider for 2/3 + weight gain in one day or 5 pounds in one week. Education on weight being a key factor in helping with heart failure exacerbation. Reviewed role of diuretics in prevention of fluid overload and management of heart failure Discussed the importance of keeping all appointments with provider. Education on the recommendation of getting an appointment for follow up with the cardiologist at [facility] Provided patient with education about the role of exercise in the management of heart failure Advised patient to discuss parameters of taking Lasix, questions and concerns about heart failure and effective management of heart failure with provider The patient has his portable oxygen now. The patients son states they had to go with a new DME provider but have this now and the patient is using as prescribed. EF 65%-(08-2021) explained what the EF% was and how heart failure can change and progress Screening for signs and symptoms of depression related to chronic disease state  Assessed social determinant of health barriers  Symptom Management: Take medications  as prescribed   Attend all scheduled provider appointments Call provider office for new concerns or questions  call the Suicide and Crisis Lifeline: 988 call the [place] National Suicide Prevention Lifeline: (509)334-5348 or TTY: 320-463-4292 TTY 959-036-0688) to talk to a trained counselor call 1-800-273-TALK (toll free, 24 hour hotline) if experiencing a Mental Health or [place]  call office if I gain more than 2 pounds in one day or 5 pounds in one week track weight in diary use salt in moderation watch for swelling in feet, ankles and legs every day weigh myself daily develop a rescue plan follow rescue plan if symptoms flare-up eat more whole grains, fruits and vegetables, lean meats and healthy fats track symptoms and what helps feel better or worse dress right for the weather, hot or cold  Follow Up Plan: Telephone follow up appointment with care management team member scheduled for: 08-03-2022 at 0945 am          Plan:Telephone follow up appointment with care management team member scheduled for:  08-03-2022 at [place] am  Noreene Larsson RN, MSN, CCM RN Care Manager  Chronic Care Management Direct Number: [phone]

## 2022-06-26 ENCOUNTER — Ambulatory Visit: Payer: PPO | Admitting: Pharmacist

## 2022-06-26 DIAGNOSIS — I4821 Permanent atrial fibrillation: Secondary | ICD-10-CM

## 2022-06-26 NOTE — Patient Instructions (Signed)
Visit Information  Thank you for taking time to visit with me today. Please don't hesitate to contact me if I can be of assistance to you before our next scheduled telephone appointment.  Following are the goals we discussed today:   Goals Addressed             This Visit's Progress    Pharmacy Goals       Please have your medications with you for our next telephone call on 07/03/2022 at 12:30 PM   Thank you!  Wallace Cullens, PharmD, [place], CPP Clinical Pharmacist [facility] [phone]          Our next appointment is by telephone on 07/03/2022 at 12:30 PM   Please call the care guide team at [phone] if you need to cancel or reschedule your appointment.    Patient verbalizes understanding of instructions and care plan provided today and agrees to view in [place]. Active MyChart status and patient understanding of how to access instructions and care plan via MyChart confirmed with patient.

## 2022-06-26 NOTE — Chronic Care Management (AMB) (Signed)
Chronic Care Management CCM Pharmacy Note  06/26/2022 Name:  Robert [name] MRN:  [id number] DOB:  [birth year]   Subjective: Robert [name] is an 88 y.o. year old male who is a primary patient of Olin Hauser, DO.  The CCM team was consulted for assistance with disease management and care coordination needs.    Outreach to patient/son by telephone for initial visit for pharmacy case management and/or care coordination services.   Objective:  Medications Reviewed Today     Reviewed by Vanita Ingles, RN (Case Manager) on 06/22/22 at [place] List Status: <None>   Medication Order Taking? Sig Documenting Provider Last Dose Status Informant  allopurinol (ZYLOPRIM) 100 MG tablet [id number] No Take 1 tablet (100 mg total) by mouth daily. Olin Hauser, DO Taking Active   apixaban (ELIQUIS) 2.5 MG TABS tablet [id number] No Take 1 tablet (2.5 mg total) by mouth 2 (two) times daily. Olin Hauser, DO Taking Active   diclofenac Sodium (VOLTAREN) 1 % GEL [id number] No Apply 2 g topically 4 (four) times daily as needed (right knee arthritis pain). Olin Hauser, DO Taking Active   fluticasone [facility] ALLERGY RELIEF) 50 MCG/ACT nasal spray [id number] No 2 spray DAILY (route: nasal) [provider] Taking Active            Med Note Kennon Portela Mar 23, 2022  4:12 PM) Med Classification: Respiratory Therapy Agents  fluticasone (FLONASE) 50 MCG/ACT nasal spray [id number] No Place 2 sprays into both nostrils daily. Use for 4-6 weeks then stop and use seasonally or as needed. Olin Hauser, DO Taking Active   furosemide (LASIX) 20 MG tablet [id number]  Take 1 tablet (20 mg total) by mouth daily as needed for fluid or edema. Olin Hauser, DO  Active   gabapentin (NEURONTIN) 100 MG capsule [id number] No Start 1 capsule daily, increase by 1 cap every 2-3 days as tolerated up to 3 times a day, or may take 3 at once in evening.  Olin Hauser, DO Taking Active   hydrochlorothiazide (HYDRODIURIL) 25 MG tablet [id number] No 1 tablet DAILY (route: oral) [provider] Taking Active            Med Note Kennon Portela Mar 23, 2022  4:12 PM) Med Classification: Cardiovascular Therapy Agents  Melatonin 10 MG TABS [id number] No Take 10 mg by mouth at bedtime. [provider] Taking Active   metoprolol succinate (TOPROL-XL) 25 MG 24 hr tablet [id number] No Take 1 tablet (25 mg total) by mouth daily. Olin Hauser, DO Taking Active   traZODone (DESYREL) 50 MG tablet [id number] No Take 1 tablet (50 mg total) by mouth at bedtime. Olin Hauser, DO Taking Active   triamcinolone cream (KENALOG) 0.5 % [id number] No Apply 1 application topically 2 (two) times daily. To affected areas, for up to 2 weeks. Olin Hauser, DO Taking Active             Pertinent Labs:   Lab Results  Component Value Date   CREATININE 1.63 (H) 08/23/2021   BUN 42 (H) 08/23/2021   NA 139 08/23/2021   K 5.3 08/23/2021   CL 105 08/23/2021   CO2 29 08/23/2021   BP Readings from Last 3 Encounters:  05/26/22 (!) 155/78  02/24/22 (!) 157/72  10/24/21 128/84   Pulse Readings from Last 3 Encounters:  05/26/22 69  02/24/22 69  10/24/21 65    SDOH:  (  Social Determinants of Health) assessments and interventions performed:  SDOH Interventions    Flowsheet Row Chronic Care Management from 06/22/2022 in [facility]  SDOH Interventions   Food Insecurity Interventions Intervention Not Indicated  Housing Interventions Intervention Not Indicated  Transportation Interventions Intervention Not Indicated  Utilities Interventions Intervention Not Indicated  Alcohol Usage Interventions Intervention Not Indicated (Score <7)  Financial Strain Interventions Intervention Not Indicated  Physical Activity Interventions Other (Comments)  [the patient does not do structured activity,  encouraged activity]  Stress Interventions Intervention Not Indicated  Social Connections Interventions Intervention Not Indicated, Other (Comment)  [has good support from his family]       [place]  Review of patient past medical history, allergies, medications, health status, including review of consultants reports, laboratory and other test data, was performed as part of comprehensive evaluation and provision of chronic care management services.   Care Plan : General Pharmacy (Adult)  Updates made by Rennis Petty, RPH-CPP since 06/26/2022 12:00 AM     Problem: Disease Progression      Long-Range Goal: Disease Progression Prevented or Minimized   Start Date: 06/26/2022  Expected End Date: 09/24/2022  This Visit's Progress: On track  Priority: High  Note:   Current Barriers:  Unable to independently afford treatment regimen while in coverage gap of [employer] Medicare plan  Pharmacist Clinical Goal(s):  patient will verbalize ability to afford treatment regimen through collaboration with PharmD and provider.    Interventions: 1:1 collaboration with Olin Hauser, DO regarding development and update of comprehensive plan of care as evidenced by provider attestation and co-signature Inter-disciplinary care team collaboration (see longitudinal plan of care) Perform chart review. Note patient seen for Office Visit with PCP on 10/20. Provider advised patient: Return to Cardiology at [facility] to follow-up  For Furosemide '20mg'$  fluid pill take only daily as needed if swelling and fluid  Patient/son unable to review medications today  Medication Assistance: Report Eliquis difficult to afford as currently in coverage gap of [employer] Medicare plan Reports picked up a 30 day supply of Eliquis for patient from pharmacy last week Counsel on eligibility requirements for Eliquis patient assistance program through manufacturer, including income  requirement and annual out of pocket expense requirement Son states that he and patient will need to review patient's financial information. Request follow up call from [facility] Pharmacist.  Patient Goals/Self-Care Activities patient will:  - take medications as prescribed as evidenced by patient report and record review - schedule follow up appointment with Cardiology        Plan: Telephone follow up appointment with care management team member scheduled for:  07/03/2022 at 12:30 PM   Wallace Cullens, PharmD, [place], [place] [phone]

## 2022-07-03 ENCOUNTER — Telehealth: Payer: PPO

## 2022-07-03 ENCOUNTER — Telehealth: Payer: Self-pay | Admitting: Pharmacist

## 2022-07-03 NOTE — Telephone Encounter (Signed)
   Outreach Note  07/03/2022 Name: Esker Dever MRN: [id number] DOB: 1932-07-16  Referred by: Olin Hauser, DO Reason for referral : No chief complaint on file.   Outreach to patient/son again today to follow up as requested about Eliquis patient assistance program. Reach son who states he has not had a chance to review financial information with patient to determine if patient meets program requirements. Requests to reschedule this appointment.  Reports unable to review medications again today, but patient will bring medications with him to upcoming appointment with PCP  Follow Up Plan: Will outreach to patient/son by telephone on 07/07/2022 at 8:30 am  Wallace Cullens, PharmD, [facility] [place] [phone]

## 2022-07-06 ENCOUNTER — Ambulatory Visit (INDEPENDENT_AMBULATORY_CARE_PROVIDER_SITE_OTHER): Payer: PPO | Admitting: Family Medicine

## 2022-07-06 ENCOUNTER — Encounter: Payer: Self-pay | Admitting: Family Medicine

## 2022-07-06 VITALS — BP 145/80 | HR 90 | Ht 67.0 in | Wt 198.0 lb

## 2022-07-06 DIAGNOSIS — M1711 Unilateral primary osteoarthritis, right knee: Secondary | ICD-10-CM | POA: Diagnosis not present

## 2022-07-06 DIAGNOSIS — G8929 Other chronic pain: Secondary | ICD-10-CM

## 2022-07-06 DIAGNOSIS — M79671 Pain in right foot: Secondary | ICD-10-CM

## 2022-07-06 DIAGNOSIS — M159 Polyosteoarthritis, unspecified: Secondary | ICD-10-CM

## 2022-07-06 DIAGNOSIS — M79672 Pain in left foot: Secondary | ICD-10-CM | POA: Diagnosis not present

## 2022-07-06 DIAGNOSIS — I4891 Unspecified atrial fibrillation: Secondary | ICD-10-CM

## 2022-07-06 DIAGNOSIS — I5030 Unspecified diastolic (congestive) heart failure: Secondary | ICD-10-CM

## 2022-07-06 DIAGNOSIS — M25561 Pain in right knee: Secondary | ICD-10-CM | POA: Diagnosis not present

## 2022-07-06 NOTE — Progress Notes (Signed)
Subjective:    Patient ID: Robert [name], male    DOB: November 14, 1931, 88 y.o.   MRN: [id number]  Robert [name] is a 88 y.o. male presenting on 07/06/2022 for Congestive Heart Failure and Hypertension   HPI  Paroxysmal Atrial Fibrillation Eliquis Managed currently by Cardiology. Working with clinical pharmacy for med assistance on Eliquis. They are awaiting further info on his financials   Insomnia Difficulty falling asleep and staying asleep They tried to give him Melatonin previously. Has not used recently. Taking Trazodone now for insomnia, some relief   Legs Swelling / Edema Off Fluid pills HCTZ Has Furosemide if needed   Follow-up Knee Pain RIGHT / Osteoarthritis / Bilateral Gout Arthritis PMH Gout   Chronic bilateral knee pain  Prior steroid injections in past 11/2019 Last X-rays 2019 with severe osteoarthritis multiple compartments. - He has not seen Orthopedic. Chronic knee problem >10+ years Worsening pain overall, now has limited function with mobility problem. Denies injury recent fall or trauma, redness, swelling, numb tingling  Previous referral to [facility] was unsuccessful due to lack of communication between patient and their office. But now granddaughter is able to help and will be available.  They ask to submit new referral today to other location  Additionally with chronic foot pain, interested in podiatry eval for custom shoes / insoles  Med rec updated  Off Metoprolol XL 73m daily, Off HCTZ 271mdaily Off Gabapentin      06/22/2022   10:33 AM 08/23/2021   11:14 AM 03/10/2021    8:54 AM  Depression screen PHQ 2/9  Decreased Interest 0 0 0  Down, Depressed, Hopeless 0 0 0  PHQ - 2 Score 0 0 0  Altered sleeping  0 0  Tired, decreased energy  0 0  Change in appetite  0 0  Feeling bad or failure about yourself   0 0  Trouble concentrating  0 0  Moving slowly or fidgety/restless  0 0  Suicidal thoughts  0 0  PHQ-9 Score  0 0  Difficult doing  work/chores  Not difficult at all Not difficult at all    Social History   Tobacco Use   Smoking status: Former    Types: Cigarettes    Quit date: 1985    Years since quitting: 38.9   Smokeless tobacco: Former  [employer] laboratory technicianse: Never used  Substance Use Topics   Alcohol use: Never   Drug use: Never    Review of Systems Per HPI unless specifically indicated above     Objective:    BP (!) 145/80 (BP Location: Left Arm, Cuff Size: Normal)   Pulse 90   Ht _0  (1.702 m)   Wt 198 lb (89.8 kg)   SpO2 100%   BMI 31.01 kg/m   Wt Readings from Last 3 Encounters:  07/06/22 198 lb (89.8 kg)  05/26/22 199 lb (90.3 kg)  02/24/22 195 lb (88.5 kg)    Physical Exam Vitals and nursing note reviewed.  Constitutional:      General: He is not in acute distress.    Appearance: Normal appearance. He is well-developed. He is not diaphoretic.     Comments: Well-appearing, comfortable, cooperative  HENT:     Head: Normocephalic and atraumatic.  Eyes:     General:        Right eye: No discharge.        Left eye: No discharge.     Conjunctiva/sclera: Conjunctivae normal.  Cardiovascular:  Rate and Rhythm: Normal rate.  Pulmonary:     Effort: Pulmonary effort is normal.  Musculoskeletal:     Comments: Bilateral Knees R worse than L with bulky knee deformity, significant crepitus, has range of motion mostly intact unchanged from baseline.  R hip non tender to direct compression test, no bony tenderness over trochanter. Has more gluteal muscular pain. No ecchymosis or hematoma. Int and Ext rotation of hip not provoking hip pain  Skin:    General: Skin is warm and dry.     Findings: No erythema or rash.  Neurological:     Mental Status: He is alert and oriented to person, place, and time.  Psychiatric:        Mood and Affect: Mood normal.        Behavior: Behavior normal.        Thought Content: Thought content normal.     Comments: Well groomed, good eye contact,  normal speech and thoughts       Results for orders placed or performed in visit on 27/61/47  BASIC METABOLIC PANEL WITH GFR  Result Value Ref Range   Glucose, Bld 88 65 - 139 mg/dL   BUN 42 (H) 7 - 25 mg/dL   Creat 1.63 (H) 0.70 - 1.22 mg/dL   eGFR 41 (L) > OR = 60 mL/min/1.37m   BUN/Creatinine Ratio 26 (H) 6 - 22 (calc)   Sodium 139 135 - 146 mmol/L   Potassium 5.3 3.5 - 5.3 mmol/L   Chloride 105 98 - 110 mmol/L   CO2 29 20 - 32 mmol/L   Calcium 9.9 8.6 - 10.3 mg/dL  CBC with Differential/Platelet  Result Value Ref Range   WBC 15.9 (H) 3.8 - 10.8 Thousand/uL   RBC 5.43 4.20 - 5.80 Million/uL   Hemoglobin 13.3 13.2 - 17.1 g/dL   HCT 42.9 38.5 - 50.0 %   MCV 79.0 (L) 80.0 - 100.0 fL   MCH 24.5 (L) 27.0 - 33.0 pg   MCHC 31.0 (L) 32.0 - 36.0 g/dL   RDW 17.1 (H) 11.0 - 15.0 %   Platelets 204 140 - 400 Thousand/uL   MPV  7.5 - 12.5 fL   Neutro Abs 4,897 1,500 - 7,800 cells/uL   Lymphs Abs 8,888 (H) 850 - 3,900 cells/uL   Absolute Monocytes 1,717 (H) 200 - 950 cells/uL   Eosinophils Absolute 254 15 - 500 cells/uL   Basophils Absolute 143 0 - 200 cells/uL   Neutrophils Relative % 30.8 %   Total Lymphocyte 55.9 %   Monocytes Relative 10.8 %   Eosinophils Relative 1.6 %   Basophils Relative 0.9 %   Smear Review    Magnesium  Result Value Ref Range   Magnesium 2.1 1.5 - 2.5 mg/dL      Assessment & Plan:   Problem List Items Addressed This Visit     Chronic pain of right knee   Relevant Orders   Ambulatory referral to Orthopedic Surgery   Osteoarthritis of multiple joints   Relevant Orders   Ambulatory referral to Orthopedic Surgery   Primary osteoarthritis of right knee - Primary   Relevant Orders   Ambulatory referral to Orthopedic Surgery   Other Visit Diagnoses     Chronic pain in right foot       Relevant Orders   Ambulatory referral to Podiatry   Chronic foot pain, left       Relevant Orders   Ambulatory referral to Podiatry  severe  osteoarthritis bilateral knees, Right is worst with severe multiple compartments of osteoarthritis back on x-ray 2019, has history of chronic gout as well impacting both knees. He has failed steroid injection in office before and would benefit from orthopedic evaluation for further procedural or other treatment options.  Switch to [facility] for referral.  referral to Podiatry for chronic foot pain, history of arthritis, needs custom shoes / inserts   Will connect with Wallace Cullens for upcoming Pharmacy call regarding financial assistance on Eliquis. Patient is difficult to reach phone. Son does not have reliable contact by phone and may be at work tomorrow 12/1 when the call is scheduled. Granddaughter is more available but will also be at work that time, and she may need to re-schedule.   Orders Placed This Encounter  Procedures   Ambulatory referral to Orthopedic Surgery    Referral Priority:   Routine    Referral Type:   Surgical    Referral Reason:   Specialty Services Required    Requested Specialty:   Orthopedic Surgery    Number of Visits Requested:   1   Ambulatory referral to Podiatry    Referral Priority:   Routine    Referral Type:   Consultation    Referral Reason:   Specialty Services Required    Requested Specialty:   Podiatry    Number of Visits Requested:   1     No orders of the defined types were placed in this encounter.     Follow up plan: Return in about 3 months (around 10/05/2022) for 3 month follow-up CHF, Arthritis, HTN.  Nobie Putnam, [facility] 07/06/2022, 10:42 AM

## 2022-07-06 NOTE — Patient Instructions (Addendum)
Thank you for coming to the office today.  Referral sent to [facility] today. Please call them and follow up within 1-2 weeks to make sure they receive the referral.  [facility] (formerly [facility] Address: [address], [place], [place] [address] Phone: [phone]  They also have walk in Urgent Care  Referral to Podiatry for evaluation of feet.  [place] Address: [address] [place], [place] [address] Hours: Open 8AM-5PM Phone: [phone]  ----------------------------------------------   Wallace Cullens, PharmD, [place] [phone]   Please schedule a Follow-up Appointment to: Return in about 3 months (around 10/05/2022) for 3 month follow-up CHF, Arthritis, HTN.  If you have any other questions or concerns, please feel free to call the office or send a message through [place]. You may also schedule an earlier appointment if necessary.  Additionally, you may be receiving a survey about your experience at our office within a few days to 1 week by e-mail or mail. We value your feedback.  Nobie Putnam, DO [place]

## 2022-07-07 ENCOUNTER — Telehealth: Payer: Self-pay | Admitting: Pharmacist

## 2022-07-07 ENCOUNTER — Telehealth: Payer: PPO

## 2022-07-07 ENCOUNTER — Ambulatory Visit: Payer: PPO | Admitting: Family Medicine

## 2022-07-07 NOTE — Telephone Encounter (Signed)
   Outreach Note  07/07/2022 Name: Traver Meckes MRN: [id number] DOB: 02-Feb-1932  Referred by: Olin Hauser, DO Reason for referral : No chief complaint on file.  Receive message from PCP advising patient seen in office yesterday and patient/granddaughter request to reschedule our appointment for today. Leave HIPAA compliant voicemail for patient/granddaughter asking patient to return call to myself or [place] to reschedule this appointment.   Follow Up Plan: Will collaborate with Care Guide to outreach to schedule follow up with me  Wallace Cullens, PharmD, [place] [phone]

## 2022-07-11 ENCOUNTER — Ambulatory Visit (INDEPENDENT_AMBULATORY_CARE_PROVIDER_SITE_OTHER): Payer: PPO | Admitting: Podiatry

## 2022-07-11 ENCOUNTER — Ambulatory Visit: Payer: PPO

## 2022-07-11 DIAGNOSIS — M779 Enthesopathy, unspecified: Secondary | ICD-10-CM

## 2022-07-11 DIAGNOSIS — G8929 Other chronic pain: Secondary | ICD-10-CM

## 2022-07-11 DIAGNOSIS — M25561 Pain in right knee: Secondary | ICD-10-CM

## 2022-07-11 NOTE — Progress Notes (Signed)
   Patient PMHx CKD stage III, CHF, HTN, muscle weakness with unsteadiness of feet, pulmonary fibrosis, chronic lower extremity lymphedema presenting today with his daughter as a referral from his PCP.  According to the patient's daughter he is supposed to have custom shoes and inserts.  Patient is not diabetic.  Patient actually complains of knee pain today.  Patient denies any pain associated to the feet.  Explained to the patient and daughter that we do not treat or evaluate knees at this location.  Since the patient is not diabetic he would not be approved for diabetic shoes and insoles.  Custom orthotics were discussed with the patient however they are too costly.  Recommend follow-up with knee specialist for primary osteoarthritis of the right knee.  No office visit charge today.  Edrick Kins, DPM [facility]  Dr. Edrick Kins, DPM    [address], [place] [address]                Office [phone]  Fax [phone]

## 2022-07-21 DIAGNOSIS — M170 Bilateral primary osteoarthritis of knee: Secondary | ICD-10-CM | POA: Diagnosis not present

## 2022-07-24 ENCOUNTER — Telehealth: Payer: Self-pay | Admitting: Pharmacist

## 2022-07-24 ENCOUNTER — Telehealth: Payer: PPO

## 2022-07-24 NOTE — Telephone Encounter (Signed)
   Outreach Note  07/24/2022 Name: Robert [name] MRN: [id number] DOB: [birth year]  Referred by: Olin Hauser, DO Reason for referral : No chief complaint on file.  Was unable to reach patient/granddaughter today by telephone today. Leave HIPAA compliant voicemail for patient/granddaughter asking patient to return call to myself or [place] to reschedule this appointment.   Follow Up Plan: Will collaborate with Care Guide to outreach to schedule follow up with me  Wallace Cullens, PharmD, [place] [phone]

## 2022-07-28 ENCOUNTER — Ambulatory Visit (INDEPENDENT_AMBULATORY_CARE_PROVIDER_SITE_OTHER): Payer: PPO | Admitting: Pharmacist

## 2022-07-28 DIAGNOSIS — I4821 Permanent atrial fibrillation: Secondary | ICD-10-CM

## 2022-07-28 NOTE — Patient Instructions (Signed)
Visit Information  Thank you for taking time to visit with me today. Please don't hesitate to contact me if I can be of assistance to you before our next scheduled telephone appointment.  Following are the goals we discussed today:   Goals Addressed             This Visit's Progress    Pharmacy Goals       Please check your home blood pressure, keep a log of the results and bring this with you to your medical appointments.  Thank you!  Wallace Cullens, PharmD, [place], CPP Clinical Pharmacist [facility] [phone]          Our next appointment is by telephone on 08/02/2022 at 11:30 am  Please call the care guide team at [phone] if you need to cancel or reschedule your appointment.    Patient verbalizes understanding of instructions and care plan provided today and agrees to view in [place]. Active MyChart status and patient understanding of how to access instructions and care plan via MyChart confirmed with patient.

## 2022-07-28 NOTE — Chronic Care Management (AMB) (Signed)
Chronic Care Management CCM Pharmacy Note  07/28/2022 Name:  Robert [name] MRN:  [id number] DOB:  07-30-1932   Subjective: Robert [name] is an 88 y.o. year old male who is a primary patient of Robert Hauser, DO.  The CCM team was consulted for assistance with disease management and care coordination needs.    Engaged with patient's granddaughter, Robert [name], per patient request by telephone for follow up visit for pharmacy case management and/or care coordination services.   Objective:  Medications Reviewed Today     Reviewed by Rennis Petty, RPH-CPP (Pharmacist) on 07/28/22 at [phone]  Med List Status: <None>   Medication Order Taking? Sig Documenting Provider Last Dose Status Informant  acetaminophen (TYLENOL) 500 MG tablet [id number] Yes Take 500 mg by mouth 2 (two) times daily as needed. [provider] Taking Active   allopurinol (ZYLOPRIM) 100 MG tablet [id number] Yes Take 1 tablet (100 mg total) by mouth daily. Robert Hauser, DO Taking Active   apixaban (ELIQUIS) 2.5 MG TABS tablet [id number] Yes Take 1 tablet (2.5 mg total) by mouth 2 (two) times daily. Robert Hauser, DO Taking Active   diclofenac Sodium (VOLTAREN) 1 % GEL [id number] Yes Apply 2 g topically 4 (four) times daily as needed (right knee arthritis pain). Robert Hauser, DO Taking Active   fluticasone (FLONASE) 50 MCG/ACT nasal spray [id number] Yes Place 2 sprays into both nostrils daily. Use for 4-6 weeks then stop and use seasonally or as needed. Robert Hauser, DO Taking Active   furosemide (LASIX) 20 MG tablet [id number]  Take 1 tablet (20 mg total) by mouth daily as needed for fluid or edema. Robert Hauser, DO  Active   traZODone (DESYREL) 50 MG tablet [id number] Yes Take 1 tablet (50 mg total) by mouth at bedtime. Robert Hauser, DO Taking Active   triamcinolone cream (KENALOG) 0.5 % [id number]  Apply 1 application topically 2 (two)  times daily. To affected areas, for up to 2 weeks. Robert Hauser, DO  Active             Pertinent Labs:  Lab Results  Component Value Date   HGBA1C 5.3 03/23/2020   Lab Results  Component Value Date   CHOL 168 02/28/2018   HDL 40 (L) 02/28/2018   LDLCALC 109 (H) 02/28/2018   TRIG 95 02/28/2018   CHOLHDL 4.2 02/28/2018   Lab Results  Component Value Date   CREATININE 1.63 (H) 08/23/2021   BUN 42 (H) 08/23/2021   NA 139 08/23/2021   K 5.3 08/23/2021   CL 105 08/23/2021   CO2 29 08/23/2021   BP Readings from Last 3 Encounters:  07/06/22 (!) 145/80  05/26/22 (!) 155/78  02/24/22 (!) 157/72   Pulse Readings from Last 3 Encounters:  07/06/22 90  05/26/22 69  02/24/22 69     SDOH:  (Social Determinants of Health) assessments and interventions performed:  SDOH Interventions    Flowsheet Row Chronic Care Management from 06/22/2022 in [facility]  SDOH Interventions   Food Insecurity Interventions Intervention Not Indicated  Housing Interventions Intervention Not Indicated  Transportation Interventions Intervention Not Indicated  Utilities Interventions Intervention Not Indicated  Alcohol Usage Interventions Intervention Not Indicated (Score <7)  Financial Strain Interventions Intervention Not Indicated  Physical Activity Interventions Other (Comments)  [the patient does not do structured activity, encouraged activity]  Stress Interventions Intervention Not Indicated  Social Connections Interventions Intervention Not Indicated, Other (Comment)  [has good support from  his family]       CCM Care Plan  Review of patient past medical history, allergies, medications, health status, including review of consultants reports, laboratory and other test data, was performed as part of comprehensive evaluation and provision of chronic care management services.   Care Plan : General Pharmacy (Adult)  Updates made by Rennis Petty, RPH-CPP  since 07/28/2022 12:00 AM     Problem: Disease Progression      Long-Range Goal: Disease Progression Prevented or Minimized   Start Date: 06/26/2022  Expected End Date: 09/24/2022  Recent Progress: On track  Priority: High  Note:   Current Barriers:  Unable to independently afford treatment regimen while in coverage gap of [employer] Medicare plan  Pharmacist Clinical Goal(s):  patient will verbalize ability to afford treatment regimen through collaboration with PharmD and provider.    Interventions: 1:1 collaboration with Robert Hauser, DO regarding development and update of comprehensive plan of care as evidenced by provider attestation and co-signature Inter-disciplinary care team collaboration (see longitudinal plan of care) Perform chart review. Note patient seen for Office Visit with PCP on 11/30 related to osteoarthritis of right knee.  Provider advised patient referrals placed to [facility] Today granddaughter reports patient went to [facility] as referred by PCP and received gel injection to knee, which helped some  Atrial Fibrillation/Heart Failure/Medication Assistance: Current treatment: Eliquis 2.5 mg twice daily Furosemide 20 mg daily as needed for fluid or edema Caregiver denies patient needing to take furosemide recently Denies patient having any symptoms of swelling recently Reports home blood pressure monitored by patient's son, but denies having this record to review today Encourage granddaughter to assist patient with scheduling follow up appointment with Cardiology as has been recommended by PCP Granddaughter requests appointment with [facility] Cardiology via [place] during our call Report Eliquis difficult to afford as currently in coverage gap of [employer] Medicare plan Reports currently has enough Eliquis to last through the end of the calendar year Counsel on eligibility requirements for [employer] subsidy and  Eliquis patient assistance program through manufacturer, including income requirement and annual out of pocket expense requirement Granddaughter states that he and patient will need to review patient's financial information Granddaughter uncertain about whether patient may currently have partial Extra Help subsidy. Will collaborate with patient's health plan, [employer] for this information  Patient Goals/Self-Care Activities patient will:  - take medications as prescribed as evidenced by patient report and record review - schedule follow up appointment with Cardiology       Plan: Telephone follow up appointment with care management team member scheduled for:  08/02/2022 at 11:30 am  Wallace Cullens, PharmD, [place], [place] [phone]

## 2022-08-02 ENCOUNTER — Telehealth: Payer: Self-pay | Admitting: Pharmacist

## 2022-08-02 ENCOUNTER — Telehealth: Payer: PPO

## 2022-08-02 NOTE — Telephone Encounter (Signed)
   Outreach Note  08/02/2022 Name: Robert [name] MRN: [id number] DOB: Jan 07, 1932  Referred by: Olin Hauser, DO Reason for referral : No chief complaint on file.  Was unable to reach patient/granddaughter today by telephone today. Leave HIPAA compliant voicemail for patient/granddaughter to return call to reschedule this appointment.   Follow Up Plan: Will collaborate with Care Guide to outreach to schedule follow up with me  Wallace Cullens, PharmD, [place] [phone]

## 2022-08-03 ENCOUNTER — Telehealth: Payer: PPO

## 2022-08-03 ENCOUNTER — Ambulatory Visit: Payer: Self-pay

## 2022-08-03 DIAGNOSIS — I4821 Permanent atrial fibrillation: Secondary | ICD-10-CM

## 2022-08-03 DIAGNOSIS — I5032 Chronic diastolic (congestive) heart failure: Secondary | ICD-10-CM

## 2022-08-03 NOTE — Chronic Care Management (AMB) (Signed)
Chronic Care Management   CCM RN Visit Note  08/03/2022 Name: Robert [name] MRN: [id number] DOB: April 19, 1932  Subjective: Robert [name] is a 88 y.o. year old male who is a primary care patient of Robert Hauser, DO. The patient was referred to the Chronic Care Management team for assistance with care management needs subsequent to provider initiation of CCM services and plan of care.    Today's Visit:   spoke with the patients son Robert [name], who is drp  for follow up visit.        Goals Addressed             This Visit's Progress    CCM Expected Outcome:  Monitor, Self-Manage and Reduce Symptoms of Afib       Current Barriers:  Knowledge Deficits related to medications that control AFIB Care Coordination needs related to cost effective medications to control AFIB and cost constraints in a patient with AFIB Chronic Disease Management support and education needs related to effective management of AFIB Financial Constraints.   Planned Interventions: Provider order and care plan reviewed. Collaborated with PharmD regarding patient care and plan. The patients granddaughter has talked with the pharm D and is checking on documents and other things needed to see if the patient will qualify for assistance with Eliquis Counseled on increased risk of stroke due to Afib and benefits of anticoagulation for stroke prevention           Reviewed importance of adherence to anticoagulant exactly as prescribed. The patient is compliant with medications. The patients son states he is also working with the insurance agent on changing insurance providers to help with cost. Advised patient to discuss changes in AFIB with provider Counseled on bleeding risk associated with AFIB and importance of self-monitoring for signs/symptoms of bleeding Counseled on avoidance of NSAIDs due to increased bleeding risk with anticoagulants Counseled on importance of regular laboratory monitoring as  prescribed Counseled on seeking medical attention after a head injury or if there is blood in the urine/stool Afib action plan reviewed The patient has upcoming appointment with the pharm D for medication cost effective measures and medications management . Screening for signs and symptoms of depression related to chronic disease state Assessed social determinant of health barriers  Symptom Management: Take medications as prescribed   Attend all scheduled provider appointments Call provider office for new concerns or questions  call the Suicide and Crisis Lifeline: 988 call the [place] National Suicide Prevention Lifeline: 639-490-2494 or TTY: 623-249-8640 TTY (607)848-3315) to talk to a trained counselor call 1-800-273-TALK (toll free, 24 hour hotline) if experiencing a Mental Health or [place]  - make a plan to exercise regularly - make a plan to eat healthy - keep all lab appointments - take medicine as prescribed  Follow Up Plan: Telephone follow up appointment with care management team member scheduled for: 10-05-2022 at 0945 am       CCM Expected Outcome:  Monitor, Self-Manage and Reduce Symptoms of Heart Failure       Current Barriers:  Knowledge Deficits related to parameters to take Lasix, monitoring for changes in heart failure and plan of care for heart failure Care Coordination needs related to resources and education needs for managing heart failure in a patient with heart failure Chronic Disease Management support and education needs related to effective management of heart failure  Planned Interventions: Basic overview and discussion of pathophysiology of Heart Failure reviewed Provided education on low sodium diet. Review of heart  healthy diet. The patients son states overall the patient is doing well with his eating. He tries to monitor it but is proud that he is keeping his weight around 200. He feels this is a plus as when it gets to be around 210 then  there are issues. Education on monitoring for hidden sodium in foods.  Reviewed Heart Failure Action Plan in depth and provided written copy. Will send information through my Chart Assessed need for readable accurate scales in home. Has scales in the home. Weighs daily, son assist Provided education about placing scale on hard, flat surface Advised patient to weigh each morning after emptying bladder Discussed importance of daily weight and advised patient to weigh and record daily. The patients son states that the patient weighs daily. Weight today is 200. The son states if he can keep his weight around 200 this is good for the patient if it gets to 210 that is when the patient has trouble.  Education on calling the provider for 2/3 + weight gain in one day or 5 pounds in one week. Education on weight being a key factor in helping with heart failure exacerbation. Reviewed role of diuretics in prevention of fluid overload and management of heart failure Discussed the importance of keeping all appointments with provider. Education on the recommendation of getting an appointment for follow up with the cardiologist at [facility] Provided patient with education about the role of exercise in the management of heart failure Advised patient to discuss parameters of taking Lasix, questions and concerns about heart failure and effective management of heart failure with provider. The patients son states instead of giving Lasix every other day like he was he is now giving every day and this seems to be working well for the patient. The patient has his portable oxygen now. The patients son states they had to go with a new DME provider but have this now and the patient is using as prescribed. EF 65%-(08-2021) explained what the EF% was and how heart failure can change and progress Screening for signs and symptoms of depression related to chronic disease state  Assessed social determinant of health barriers  Symptom  Management: Take medications as prescribed   Attend all scheduled provider appointments Call provider office for new concerns or questions  call the Suicide and Crisis Lifeline: 988 call the [place] National Suicide Prevention Lifeline: (510)597-9981 or TTY: 251-787-5119 TTY (724)130-7112) to talk to a trained counselor call 1-800-273-TALK (toll free, 24 hour hotline) if experiencing a Mental Health or [place]  call office if I gain more than 2 pounds in one day or 5 pounds in one week track weight in diary use salt in moderation watch for swelling in feet, ankles and legs every day weigh myself daily develop a rescue plan follow rescue plan if symptoms flare-up eat more whole grains, fruits and vegetables, lean meats and healthy fats track symptoms and what helps feel better or worse dress right for the weather, hot or cold  Follow Up Plan: Telephone follow up appointment with care management team member scheduled for: 10-05-2022 at 0945 am          Plan:Telephone follow up appointment with care management team member scheduled for:  10-05-2022 at [place] am  Noreene Larsson RN, MSN, CCM RN Care Manager  Chronic Care Management Direct Number: [phone]

## 2022-08-03 NOTE — Patient Instructions (Signed)
Please call the care guide team at [phone] if you need to cancel or reschedule your appointment.   If you are experiencing a Mental Health or [place] or need someone to talk to, please call the Suicide and Crisis Lifeline: 988 call the [place] National Suicide Prevention Lifeline: (760) 844-5027 or TTY: (702)783-9345 TTY 7623238913) to talk to a trained counselor call 1-800-273-TALK (toll free, 24 hour hotline)   Following is a copy of the CCM Program Consent:  CCM service includes personalized support from designated clinical staff supervised by the physician, including individualized plan of care and coordination with other care providers 24/7 contact phone numbers for assistance for urgent and routine care needs. Service will only be billed when office clinical staff spend 20 minutes or more in a month to coordinate care. Only one practitioner may furnish and bill the service in a calendar month. The patient may stop CCM services at amy time (effective at the end of the month) by phone call to the office staff. The patient will be responsible for cost sharing (co-pay) or up to 20% of the service fee (after annual deductible is met)  Following is a copy of your full provider care plan:   Goals Addressed             This Visit's Progress    CCM Expected Outcome:  Monitor, Self-Manage and Reduce Symptoms of Afib       Current Barriers:  Knowledge Deficits related to medications that control AFIB Care Coordination needs related to cost effective medications to control AFIB and cost constraints in a patient with AFIB Chronic Disease Management support and education needs related to effective management of AFIB Financial Constraints.   Planned Interventions: Provider order and care plan reviewed. Collaborated with PharmD regarding patient care and plan. The patients granddaughter has talked with the pharm D and is checking on documents and other things needed to see if  the patient will qualify for assistance with Eliquis Counseled on increased risk of stroke due to Afib and benefits of anticoagulation for stroke prevention           Reviewed importance of adherence to anticoagulant exactly as prescribed. The patient is compliant with medications. The patients son states he is also working with the insurance agent on changing insurance providers to help with cost. Advised patient to discuss changes in AFIB with provider Counseled on bleeding risk associated with AFIB and importance of self-monitoring for signs/symptoms of bleeding Counseled on avoidance of NSAIDs due to increased bleeding risk with anticoagulants Counseled on importance of regular laboratory monitoring as prescribed Counseled on seeking medical attention after a head injury or if there is blood in the urine/stool Afib action plan reviewed The patient has upcoming appointment with the pharm D for medication cost effective measures and medications management . Screening for signs and symptoms of depression related to chronic disease state Assessed social determinant of health barriers  Symptom Management: Take medications as prescribed   Attend all scheduled provider appointments Call provider office for new concerns or questions  call the Suicide and Crisis Lifeline: 988 call the [place] National Suicide Prevention Lifeline: (212) 199-6426 or TTY: (712)441-0620 TTY 2507001001) to talk to a trained counselor call 1-800-273-TALK (toll free, 24 hour hotline) if experiencing a Mental Health or [place]  - make a plan to exercise regularly - make a plan to eat healthy - keep all lab appointments - take medicine as prescribed  Follow Up Plan: Telephone follow up appointment  with care management team member scheduled for: 10-05-2022 at 0945 am       CCM Expected Outcome:  Monitor, Self-Manage and Reduce Symptoms of Heart Failure       Current Barriers:  Knowledge Deficits related  to parameters to take Lasix, monitoring for changes in heart failure and plan of care for heart failure Care Coordination needs related to resources and education needs for managing heart failure in a patient with heart failure Chronic Disease Management support and education needs related to effective management of heart failure  Planned Interventions: Basic overview and discussion of pathophysiology of Heart Failure reviewed Provided education on low sodium diet. Review of heart healthy diet. The patients son states overall the patient is doing well with his eating. He tries to monitor it but is proud that he is keeping his weight around 200. He feels this is a plus as when it gets to be around 210 then there are issues. Education on monitoring for hidden sodium in foods.  Reviewed Heart Failure Action Plan in depth and provided written copy. Will send information through my Chart Assessed need for readable accurate scales in home. Has scales in the home. Weighs daily, son assist Provided education about placing scale on hard, flat surface Advised patient to weigh each morning after emptying bladder Discussed importance of daily weight and advised patient to weigh and record daily. The patients son states that the patient weighs daily. Weight today is 200. The son states if he can keep his weight around 200 this is good for the patient if it gets to 210 that is when the patient has trouble.  Education on calling the provider for 2/3 + weight gain in one day or 5 pounds in one week. Education on weight being a key factor in helping with heart failure exacerbation. Reviewed role of diuretics in prevention of fluid overload and management of heart failure Discussed the importance of keeping all appointments with provider. Education on the recommendation of getting an appointment for follow up with the cardiologist at [facility] Provided patient with education about the role of exercise in the management of  heart failure Advised patient to discuss parameters of taking Lasix, questions and concerns about heart failure and effective management of heart failure with provider. The patients son states instead of giving Lasix every other day like he was he is now giving every day and this seems to be working well for the patient. The patient has his portable oxygen now. The patients son states they had to go with a new DME provider but have this now and the patient is using as prescribed. EF 65%-(08-2021) explained what the EF% was and how heart failure can change and progress Screening for signs and symptoms of depression related to chronic disease state  Assessed social determinant of health barriers  Symptom Management: Take medications as prescribed   Attend all scheduled provider appointments Call provider office for new concerns or questions  call the Suicide and Crisis Lifeline: 988 call the [place] National Suicide Prevention Lifeline: (785)427-4507 or TTY: 629-818-6235 TTY 580-818-5141) to talk to a trained counselor call 1-800-273-TALK (toll free, 24 hour hotline) if experiencing a Mental Health or [place]  call office if I gain more than 2 pounds in one day or 5 pounds in one week track weight in diary use salt in moderation watch for swelling in feet, ankles and legs every day weigh myself daily develop a rescue plan follow rescue plan if symptoms flare-up  eat more whole grains, fruits and vegetables, lean meats and healthy fats track symptoms and what helps feel better or worse dress right for the weather, hot or cold  Follow Up Plan: Telephone follow up appointment with care management team member scheduled for: 10-05-2022 at 0945 am          Patient verbalizes understanding of instructions and care plan provided today and agrees to view in [place]. Active MyChart status and patient understanding of how to access instructions and care plan via MyChart confirmed with  patient.     Telephone follow up appointment with care management team member scheduled for: 10-05-2022 at 0945 am

## 2022-08-06 DIAGNOSIS — I5032 Chronic diastolic (congestive) heart failure: Secondary | ICD-10-CM

## 2022-08-06 DIAGNOSIS — I4821 Permanent atrial fibrillation: Secondary | ICD-10-CM

## 2022-08-10 ENCOUNTER — Telehealth: Payer: Self-pay

## 2022-08-10 NOTE — Progress Notes (Signed)
  Chronic Care Management Note  08/10/2022 Name: Robert [name] MRN: [id number] DOB: Mar 23, 1932  Robert [name] is a 89 y.o. year old male who is a primary care patient of Olin Hauser, DO and is actively engaged with the care management team. I reached out to [employer]  by phone today to assist with re-scheduling a follow up visit with the Pharmacist  Follow up plan: Unsuccessful telephone outreach attempt made. A HIPAA compliant phone message was left for the patient providing contact information and requesting a return call.  The care management team will reach out to the patient again over the next 7 days.  If patient returns call to provider office, please advise to call [facility]  at [place], [place], [place] [address] Direct Dial: [phone] [email or web address]

## 2022-08-17 ENCOUNTER — Ambulatory Visit: Payer: Self-pay

## 2022-08-17 NOTE — Telephone Encounter (Signed)
Please check Webb Silversmith, FNP schedule today for add on work in virtual visit or my schedule tomorrow for add on virtual.  Otherwise, please provide them with any virtual urgent care options or in person urgent care if his symptoms worsen or become severe.  Nobie Putnam, [facility] 08/17/2022, 1:21 PM

## 2022-08-17 NOTE — Telephone Encounter (Signed)
    Chief Complaint: Cough, congestion, fatigue. No availability, asking to be worked in. No answer on FC line. Symptoms: Above Frequency: 1 week ago Pertinent Negatives: Patient denies fever Disposition: '[]'$ ED /'[]'$ Urgent Care (no appt availability in office) / '[]'$ Appointment(In office/virtual)/ '[]'$  [facility]/ '[]'$ Home Care/ '[]'$ Refused Recommended Disposition /'[]'$ [facility]/ '[x]'$  Follow-up with PCP Additional Notes: Please advise granddaughter.   Answer Assessment - Initial Assessment Questions 1. ONSET: "When did the cough begin?"      1 week ago 2. SEVERITY: "How bad is the cough today?"      Severe 3. SPUTUM: "Describe the color of your sputum" (none, dry cough; clear, Card, yellow, green)     Clear 4. HEMOPTYSIS: "Are you coughing up any blood?" If so ask: "How much?" (flecks, streaks, tablespoons, etc.)     No 5. DIFFICULTY BREATHING: "Are you having difficulty breathing?" If Yes, ask: "How bad is it?" (e.g., mild, moderate, severe)    - MILD: No SOB at rest, mild SOB with walking, speaks normally in sentences, can lie down, no retractions, pulse < 100.    - MODERATE: SOB at rest, SOB with minimal exertion and prefers to sit, cannot lie down flat, speaks in phrases, mild retractions, audible wheezing, pulse 100-120.    - SEVERE: Very SOB at rest, speaks in single words, struggling to breathe, sitting hunched forward, retractions, pulse > 120      Has O2 at home 6. FEVER: "Do you have a fever?" If Yes, ask: "What is your temperature, how was it measured, and when did it start?"     No 7. CARDIAC HISTORY: "Do you have any history of heart disease?" (e.g., heart attack, congestive heart failure)      Yes 8. LUNG HISTORY: "Do you have any history of lung disease?"  (e.g., pulmonary embolus, asthma, emphysema)     Has had pneumonia  9. PE RISK FACTORS: "Do you have a history of blood clots?" (or: recent major surgery, recent prolonged travel, bedridden)     No 10.  OTHER SYMPTOMS: "Do you have any other symptoms?" (e.g., runny nose, wheezing, chest pain)       Mild wheezing 11. PREGNANCY: "Is there any chance you are pregnant?" "When was your last menstrual period?"       N.a 12. TRAVEL: "Have you traveled out of the country in the last month?" (e.g., travel history, exposures)       No  Protocols used: Cough - Acute Productive-A-AH

## 2022-08-18 ENCOUNTER — Ambulatory Visit: Payer: PPO | Admitting: Internal Medicine

## 2022-08-18 DIAGNOSIS — J069 Acute upper respiratory infection, unspecified: Secondary | ICD-10-CM | POA: Diagnosis not present

## 2022-08-18 DIAGNOSIS — Z03818 Encounter for observation for suspected exposure to other biological agents ruled out: Secondary | ICD-10-CM | POA: Diagnosis not present

## 2022-08-18 NOTE — Progress Notes (Deleted)
Subjective:    Patient ID: Robert [name], male    DOB: September 02, 1931, 89 y.o.   MRN: [id number]  HPI  Patient presents to clinic today with complaint of fatigue and cough.  This started 10 days ago.  He has a history of CHF and pulmonary fibrosis.  Review of Systems     Past Medical History:  Diagnosis Date   Gout    Hyperlipidemia    Hypertension     Current Outpatient Medications  Medication Sig Dispense Refill   acetaminophen (TYLENOL) 500 MG tablet Take 500 mg by mouth 2 (two) times daily as needed.     allopurinol (ZYLOPRIM) 100 MG tablet Take 1 tablet (100 mg total) by mouth daily. 90 tablet 3   apixaban (ELIQUIS) 2.5 MG TABS tablet Take 1 tablet (2.5 mg total) by mouth 2 (two) times daily. 60 tablet 6   diclofenac Sodium (VOLTAREN) 1 % GEL Apply 2 g topically 4 (four) times daily as needed (right knee arthritis pain). 100 g 5   fluticasone (FLONASE) 50 MCG/ACT nasal spray Place 2 sprays into both nostrils daily. Use for 4-6 weeks then stop and use seasonally or as needed. 16 g 1   furosemide (LASIX) 20 MG tablet Take 1 tablet (20 mg total) by mouth daily as needed for fluid or edema. 30 tablet 5   traZODone (DESYREL) 50 MG tablet Take 1 tablet (50 mg total) by mouth at bedtime. 90 tablet 1   triamcinolone cream (KENALOG) 0.5 % Apply 1 application topically 2 (two) times daily. To affected areas, for up to 2 weeks. 30 g 2   No current facility-administered medications for this visit.    No Known Allergies  Family History  Family history unknown: Yes    Social History   Socioeconomic History   Marital status: Widowed    Spouse name: Not on file   Number of children: Not on file   Years of education: Not on file   Highest education level: Not on file  Occupational History   Occupation: retired  Tobacco Use   Smoking status: Former    Types: Cigarettes    Quit date: 1985    Years since quitting: 39.0   Smokeless tobacco: Former  Scientific laboratory technician Use: Never  used  Substance and Sexual Activity   Alcohol use: Never   Drug use: Never   Sexual activity: Not on file  Other Topics Concern   Not on file  Social History Narrative   Not on file   Social Determinants of Health   Financial Resource Strain: [place]  (06/22/2022)   Overall Financial Resource Strain (CARDIA)    Difficulty of Paying Living Expenses: Not hard at all  Food Insecurity: No Food Insecurity (06/22/2022)   Hunger Vital Sign    Worried About Running Out of Food in the Last Year: Never true    [place] in the Last Year: Never true  Transportation Needs: No Transportation Needs (06/22/2022)   PRAPARE - Hydrologist (Medical): No    Lack of Transportation (Non-Medical): No  Physical Activity: Inactive (06/22/2022)   Exercise Vital Sign    Days of Exercise per Week: 0 days    Minutes of Exercise per Session: 0 min  Stress: No Stress Concern Present (06/22/2022)   [place]    Feeling of Stress : Not at all  Social Connections: Moderately Isolated (06/22/2022)  Social Licensed conveyancer [NHANES]    Frequency of Communication with Friends and Family: More than three times a week    Frequency of Social Gatherings with Friends and Family: More than three times a week    Attends Religious Services: More than 4 times per year    Active Member of [employer] or Organizations: No    Attends Archivist Meetings: Never    Marital Status: Widowed  Intimate Partner Violence: Not At Risk (06/22/2022)   Humiliation, Afraid, Rape, and Kick questionnaire    Fear of Current or Ex-Partner: No    Emotionally Abused: No    Physically Abused: No    Sexually Abused: No     Constitutional: Patient reports fatigue.  Denies fever, malaise, headache or abrupt weight changes.  HEENT: Denies eye pain, eye redness, ear pain, ringing in the ears, wax buildup, runny nose, nasal  congestion, bloody nose, or sore throat. Respiratory: Patient reports cough.  Denies difficulty breathing, shortness of breath, or sputum production.   Cardiovascular: Denies chest pain, chest tightness, palpitations or swelling in the hands or feet.  Gastrointestinal: Denies abdominal pain, bloating, constipation, diarrhea or blood in the stool.  GU: Denies urgency, frequency, pain with urination, burning sensation, blood in urine, odor or discharge. Musculoskeletal: Denies decrease in range of motion, difficulty with gait, muscle pain or joint pain and swelling.  Skin: Denies redness, rashes, lesions or ulcercations.  Neurological: Denies dizziness, difficulty with memory, difficulty with speech or problems with balance and coordination.  Psych: Denies anxiety, depression, SI/HI.  No other specific complaints in a complete review of systems (except as listed in HPI above).  Objective:   Physical Exam   There were no vitals taken for this visit. Wt Readings from Last 3 Encounters:  07/06/22 198 lb (89.8 kg)  05/26/22 199 lb (90.3 kg)  02/24/22 195 lb (88.5 kg)    General: Appears their stated age, well developed, well nourished in NAD. Skin: Warm, dry and intact. No rashes, lesions or ulcerations noted. HEENT: Head: normal shape and size; Eyes: sclera [place], no icterus, conjunctiva pink, PERRLA and EOMs intact; Ears: Tm's gray and intact, normal light reflex; Nose: mucosa pink and moist, septum midline; Throat/Mouth: Teeth present, mucosa pink and moist, no exudate, lesions or ulcerations noted.  Neck:  Neck supple, trachea midline. No masses, lumps or thyromegaly present.  Cardiovascular: Normal rate and rhythm. S1,S2 noted.  No murmur, rubs or gallops noted. No JVD or BLE edema. No carotid bruits noted. Pulmonary/Chest: Normal effort and positive vesicular breath sounds. No respiratory distress. No wheezes, rales or ronchi noted.  Abdomen: Soft and nontender. Normal bowel sounds. No  distention or masses noted. Liver, spleen and kidneys non palpable. Musculoskeletal: Normal range of motion. No signs of joint swelling. No difficulty with gait.  Neurological: Alert and oriented. Cranial nerves II-XII grossly intact. Coordination normal.  Psychiatric: Mood and affect normal. Behavior is normal. Judgment and thought content normal.    BMET    Component Value Date/Time   NA 139 08/23/2021 1144   K 5.3 08/23/2021 1144   CL 105 08/23/2021 1144   CO2 29 08/23/2021 1144   GLUCOSE 88 08/23/2021 1144   BUN 42 (H) 08/23/2021 1144   CREATININE 1.63 (H) 08/23/2021 1144   CALCIUM 9.9 08/23/2021 1144   GFRNONAA 30 (L) 08/16/2020 1059   GFRAA 34 (L) 08/16/2020 1059    Lipid Panel     Component Value Date/Time   CHOL 168 02/28/2018 0802  TRIG 95 02/28/2018 0802   HDL 40 (L) 02/28/2018 0802   CHOLHDL 4.2 02/28/2018 0802   LDLCALC 109 (H) 02/28/2018 0802    CBC    Component Value Date/Time   WBC 15.9 (H) 08/23/2021 1144   RBC 5.43 08/23/2021 1144   HGB 13.3 08/23/2021 1144   HCT 42.9 08/23/2021 1144   PLT 204 08/23/2021 1144   MCV 79.0 (L) 08/23/2021 1144   MCH 24.5 (L) 08/23/2021 1144   MCHC 31.0 (L) 08/23/2021 1144   RDW 17.1 (H) 08/23/2021 1144   LYMPHSABS 8,888 (H) 08/23/2021 1144   EOSABS 254 08/23/2021 1144   BASOSABS 143 08/23/2021 1144    Hgb A1C Lab Results  Component Value Date   HGBA1C 5.3 03/23/2020           Assessment & Plan:      Follow-up with your PCP as previously scheduled Webb Silversmith, NP

## 2022-08-19 ENCOUNTER — Other Ambulatory Visit: Payer: Self-pay | Admitting: Family Medicine

## 2022-08-19 DIAGNOSIS — F5101 Primary insomnia: Secondary | ICD-10-CM

## 2022-08-21 NOTE — Telephone Encounter (Signed)
Requested Prescriptions  Pending Prescriptions Disp Refills   traZODone (DESYREL) 50 MG tablet [Pharmacy Med Name: TRAZODONE 50 MG TABLET] 90 tablet 0    Sig: Take 1 tablet (50 mg total) by mouth at bedtime.     Psychiatry: Antidepressants - Serotonin Modulator Passed - 08/19/2022 11:01 AM      Passed - Valid encounter within last 6 months    Recent Outpatient Visits           1 month ago Primary osteoarthritis of right knee   [place], DO   2 months ago Chronic heart failure with preserved ejection fraction [facility])   [place], DO   5 months ago Primary osteoarthritis of right knee   [place], DO   10 months ago Bilateral hearing loss, unspecified hearing loss type   [place], DO   12 months ago Acute heart failure with preserved ejection fraction (HFpEF) [facility])   [place], DO

## 2022-08-23 NOTE — Progress Notes (Signed)
  Chronic Care Management Note  08/23/2022 Name: Jacier Gladu MRN: [id number] DOB: 14-Dec-1931  Guerry Covington is a 89 y.o. year old male who is a primary care patient of Olin Hauser, DO and is actively engaged with the Chronic Care Management team. I reached out to [employer]  by phone today to assist with re-scheduling a follow up visit with the Pharmacist  Follow up plan: Telephone appointment with care management team member scheduled for:09/06/2022  Noreene Larsson, [facility]  [place], [place] [address] Direct Dial: [phone] [email or web address]

## 2022-09-06 ENCOUNTER — Ambulatory Visit (INDEPENDENT_AMBULATORY_CARE_PROVIDER_SITE_OTHER): Payer: PPO | Admitting: Pharmacist

## 2022-09-06 DIAGNOSIS — I4821 Permanent atrial fibrillation: Secondary | ICD-10-CM

## 2022-09-06 NOTE — Patient Instructions (Signed)
Visit Information  Thank you for taking time to visit with me today. Please don't hesitate to contact me if I can be of assistance to you before our next scheduled telephone appointment.  Following are the goals we discussed today:   Goals Addressed             This Visit's Progress    Pharmacy Goals       Please check your home blood pressure, keep a log of the results and bring this with you to your medical appointments.  Thank you!  Wallace Cullens, PharmD, [place], CPP Clinical Pharmacist [facility] [phone]          Please call the care guide team at [phone] if you need to cancel or reschedule your appointment.    Patient verbalizes understanding of instructions and care plan provided today and agrees to view in [place]. Active MyChart status and patient understanding of how to access instructions and care plan via MyChart confirmed with patient.

## 2022-09-06 NOTE — Chronic Care Management (AMB) (Signed)
Chronic Care Management CCM Pharmacy Note  09/06/2022 Name:  Robert [name] MRN:  [id number] DOB:  [birth year]   Subjective: Robert [name] is an 89 y.o. year old male who is a primary patient of Olin Hauser, DO.  The CCM team was consulted for assistance with disease management and care coordination needs.    Engaged with patient's granddaughter, Robert [name], per patient request by telephone for follow up visit for pharmacy case management and/or care coordination services.   Objective:  Medications Reviewed Today     Reviewed by Vanita Ingles, RN (Case Manager) on 08/03/22 at 1010  Med List Status: <None>   Medication Order Taking? Sig Documenting Provider Last Dose Status Informant  acetaminophen (TYLENOL) 500 MG tablet [id number] No Take 500 mg by mouth 2 (two) times daily as needed. [provider] Taking Active   allopurinol (ZYLOPRIM) 100 MG tablet [id number] No Take 1 tablet (100 mg total) by mouth daily. Olin Hauser, DO Taking Active   apixaban (ELIQUIS) 2.5 MG TABS tablet [id number] No Take 1 tablet (2.5 mg total) by mouth 2 (two) times daily. Olin Hauser, DO Taking Active   diclofenac Sodium (VOLTAREN) 1 % GEL [id number] No Apply 2 g topically 4 (four) times daily as needed (right knee arthritis pain). Olin Hauser, DO Taking Active   fluticasone (FLONASE) 50 MCG/ACT nasal spray [id number] No Place 2 sprays into both nostrils daily. Use for 4-6 weeks then stop and use seasonally or as needed. Olin Hauser, DO Taking Active   furosemide (LASIX) 20 MG tablet [id number]  Take 1 tablet (20 mg total) by mouth daily as needed for fluid or edema. Olin Hauser, DO  Active   traZODone (DESYREL) 50 MG tablet [id number] No Take 1 tablet (50 mg total) by mouth at bedtime. Olin Hauser, DO Taking Active   triamcinolone cream (KENALOG) 0.5 % [id number] No Apply 1 application topically 2 (two) times daily.  To affected areas, for up to 2 weeks. Olin Hauser, DO Taking Active             Pertinent Labs:   Lab Results  Component Value Date   CREATININE 1.63 (H) 08/23/2021   BUN 42 (H) 08/23/2021   NA 139 08/23/2021   K 5.3 08/23/2021   CL 105 08/23/2021   CO2 29 08/23/2021   BP Readings from Last 3 Encounters:  07/06/22 (!) 145/80  05/26/22 (!) 155/78  02/24/22 (!) 157/72    Pulse Readings from Last 3 Encounters:  07/06/22 90  05/26/22 69  02/24/22 69    SDOH:  (Social Determinants of Health) assessments and interventions performed:  SDOH Interventions    Flowsheet Row Chronic Care Management from 06/22/2022 in [facility]  SDOH Interventions   Food Insecurity Interventions Intervention Not Indicated  Housing Interventions Intervention Not Indicated  Transportation Interventions Intervention Not Indicated  Utilities Interventions Intervention Not Indicated  Alcohol Usage Interventions Intervention Not Indicated (Score <7)  Financial Strain Interventions Intervention Not Indicated  Physical Activity Interventions Other (Comments)  [the patient does not do structured activity, encouraged activity]  Stress Interventions Intervention Not Indicated  Social Connections Interventions Intervention Not Indicated, Other (Comment)  [has good support from his family]       [place]  Review of patient past medical history, allergies, medications, health status, including review of consultants reports, laboratory and other test data, was performed as part of comprehensive evaluation and provision of chronic care  management services.   Care Plan : General Pharmacy (Adult)  Updates made by Rennis Petty, RPH-CPP since 09/06/2022 12:00 AM     Problem: Disease Progression      Long-Range Goal: Disease Progression Prevented or Minimized   Start Date: 06/26/2022  Expected End Date: 09/24/2022  Recent Progress: On track  Priority:  High  Note:   Current Barriers:  Unable to independently afford treatment regimen while in coverage gap of [employer] Medicare plan  Pharmacist Clinical Goal(s):  patient will verbalize ability to afford treatment regimen through collaboration with PharmD and provider.    Interventions: 1:1 collaboration with Olin Hauser, DO regarding development and update of comprehensive plan of care as evidenced by provider attestation and co-signature Inter-disciplinary care team collaboration (see longitudinal plan of care) Perform chart review. Note patient seen for Office Visit with [facility] on 08/18/2022 related to respiratory symptoms Today granddaughter reports patient completed the course of doxycycline as prescribed and feeling much better   Atrial Fibrillation/Heart Failure/Medication Assistance: Current treatment: Eliquis 2.5 mg twice daily Furosemide 20 mg daily as needed for fluid or edema Caregiver denies patient needing to take furosemide recently Denies patient having any symptoms of swelling recently Reports home blood pressure monitored by patient's son, but denies having this record to review today Again encourage granddaughter to assist patient with scheduling follow up appointment with Cardiology as has been recommended by PCP Granddaughter confirms has phone number for [facility] Cardiology and will call to schedule appointment for patient Follow up regarding assistance with cost of Eliquis Based on reported income, patient would meet criteria for Extra Help subsidy through [place] states she is uncertain if patient has applied for Extra Help Advise granddaughter that this information can be found through patient's Medicare.gov account Granddaughter accesses account during our call today and finds patient now has Full Extra Help for 2024 calendar year  Patient Goals/Self-Care Activities patient will:  - take medications as  prescribed as evidenced by patient report and record review - schedule follow up appointment with Cardiology       Plan: Patient denies further medication questions or concerns today Provide patient with contact information for clinic pharmacist to contact if needed in future for medication questions/concerns  Wallace Cullens, PharmD, [place] [phone]

## 2022-09-22 ENCOUNTER — Other Ambulatory Visit: Payer: Self-pay | Admitting: Family Medicine

## 2022-09-22 DIAGNOSIS — I4821 Permanent atrial fibrillation: Secondary | ICD-10-CM

## 2022-09-25 ENCOUNTER — Other Ambulatory Visit: Payer: Self-pay | Admitting: Family Medicine

## 2022-09-25 DIAGNOSIS — I4821 Permanent atrial fibrillation: Secondary | ICD-10-CM

## 2022-09-25 NOTE — Telephone Encounter (Signed)
Requested medications are due for refill today.  yes  Requested medications are on the active medications list.  yes  Last refill. 02/17/2022 #[id number]  Future visit scheduled.   no  Notes to clinic.  Labs are expired.    Requested Prescriptions  Pending Prescriptions Disp Refills   ELIQUIS 2.5 MG TABS tablet [Pharmacy Med Name: ELIQUIS 2.5 MG TABLET] 60 tablet 0    Sig: Take 1 tablet (2.5 mg total) by mouth 2 (two) times daily.     Hematology:  Anticoagulants - apixaban Failed - 09/22/2022  4:20 PM      Failed - PLT in normal range and within 360 days    Platelets  Date Value Ref Range Status  08/23/2021 204 140 - 400 Thousand/uL Final         Failed - HGB in normal range and within 360 days    Hemoglobin  Date Value Ref Range Status  08/23/2021 13.3 13.2 - 17.1 g/dL Final         Failed - HCT in normal range and within 360 days    HCT  Date Value Ref Range Status  08/23/2021 42.9 38.5 - 50.0 % Final         Failed - Cr in normal range and within 360 days    Creat  Date Value Ref Range Status  08/23/2021 1.63 (H) 0.70 - 1.22 mg/dL Final         Failed - AST in normal range and within 360 days    AST  Date Value Ref Range Status  03/23/2020 17 10 - 35 U/L Final         Failed - ALT in normal range and within 360 days    ALT  Date Value Ref Range Status  03/23/2020 8 (L) 9 - 46 U/L Final         Passed - Valid encounter within last 12 months    Recent Outpatient Visits           2 months ago Primary osteoarthritis of right knee   [place], DO   4 months ago Chronic heart failure with preserved ejection fraction [facility])   [place] Garcia, DO   7 months ago Primary osteoarthritis of right knee   [facility] Olin Hauser, DO   11 months ago Bilateral hearing loss, unspecified hearing loss type   [place], DO   1 year ago Acute heart failure with preserved ejection fraction (HFpEF) [facility])   [place], Devonne Doughty, [place]

## 2022-09-28 DIAGNOSIS — D849 Immunodeficiency, unspecified: Secondary | ICD-10-CM | POA: Diagnosis not present

## 2022-09-28 DIAGNOSIS — C9110 Chronic lymphocytic leukemia of B-cell type not having achieved remission: Secondary | ICD-10-CM | POA: Diagnosis not present

## 2022-09-28 DIAGNOSIS — I5032 Chronic diastolic (congestive) heart failure: Secondary | ICD-10-CM | POA: Diagnosis not present

## 2022-09-28 DIAGNOSIS — I4891 Unspecified atrial fibrillation: Secondary | ICD-10-CM | POA: Diagnosis not present

## 2022-09-28 DIAGNOSIS — N183 Chronic kidney disease, stage 3 unspecified: Secondary | ICD-10-CM | POA: Diagnosis not present

## 2022-09-28 DIAGNOSIS — D630 Anemia in neoplastic disease: Secondary | ICD-10-CM | POA: Diagnosis not present

## 2022-09-28 DIAGNOSIS — C801 Malignant (primary) neoplasm, unspecified: Secondary | ICD-10-CM | POA: Diagnosis not present

## 2022-10-05 ENCOUNTER — Telehealth: Payer: PPO

## 2022-10-05 ENCOUNTER — Ambulatory Visit (INDEPENDENT_AMBULATORY_CARE_PROVIDER_SITE_OTHER): Payer: PPO

## 2022-10-05 DIAGNOSIS — I5032 Chronic diastolic (congestive) heart failure: Secondary | ICD-10-CM

## 2022-10-05 DIAGNOSIS — I4821 Permanent atrial fibrillation: Secondary | ICD-10-CM

## 2022-10-05 NOTE — Chronic Care Management (AMB) (Signed)
Chronic Care Management   CCM RN Visit Note  10/05/2022 Name: Robert [name] MRN: [id number] DOB: 03/10/1932  Subjective: Robert [name] is a 89 y.o. year old male who is a primary care patient of Olin Hauser, DO. The patient was referred to the Chronic Care Management team for assistance with care management needs subsequent to provider initiation of CCM services and plan of care.    Today's Visit:  Engaged with patient by telephone for follow up visit.        Goals Addressed             This Visit's Progress    CCM Expected Outcome:  Monitor, Self-Manage and Reduce Symptoms of Afib       Current Barriers:  Knowledge Deficits related to medications that control AFIB Care Coordination needs related to cost effective medications to control AFIB and cost constraints in a patient with AFIB Chronic Disease Management support and education needs related to effective management of AFIB Financial Constraints.   Planned Interventions: Provider order and care plan reviewed. Collaborated with PharmD regarding patient care and plan. The patients granddaughter has talked with the pharm D and is checking on documents and other things needed to see if the patient will qualify for assistance with Eliquis. Continues to work with the pharm D on a regular basis. Will continue to monitor for changes Counseled on increased risk of stroke due to Afib and benefits of anticoagulation for stroke prevention           Reviewed importance of adherence to anticoagulant exactly as prescribed. The patient is compliant with medications. The patients son states he is also working with the insurance agent on changing insurance providers to help with cost. Advised patient to discuss changes in AFIB with provider Counseled on bleeding risk associated with AFIB and importance of self-monitoring for signs/symptoms of bleeding Counseled on avoidance of NSAIDs due to increased bleeding risk with  anticoagulants Counseled on importance of regular laboratory monitoring as prescribed. Has labs on a regular basis. No acute changes. Counseled on seeking medical attention after a head injury or if there is blood in the urine/stool Afib action plan reviewed The patient has upcoming appointment with the pharm D for medication cost effective measures and medications management . Screening for signs and symptoms of depression related to chronic disease state Assessed social determinant of health barriers  Symptom Management: Take medications as prescribed   Attend all scheduled provider appointments Call provider office for new concerns or questions  call the Suicide and Crisis Lifeline: 988 call the [place] National Suicide Prevention Lifeline: 786-173-0362 or TTY: 567-543-7842 TTY 2143275690) to talk to a trained counselor call 1-800-273-TALK (toll free, 24 hour hotline) if experiencing a Mental Health or [place]  - make a plan to exercise regularly - make a plan to eat healthy - keep all lab appointments - take medicine as prescribed  Follow Up Plan: Telephone follow up appointment with care management team member scheduled for: 12-07-2022 at 1030am       CCM Expected Outcome:  Monitor, Self-Manage and Reduce Symptoms of Heart Failure       Current Barriers:  Knowledge Deficits related to parameters to take Lasix, monitoring for changes in heart failure and plan of care for heart failure Care Coordination needs related to resources and education needs for managing heart failure in a patient with heart failure Chronic Disease Management support and education needs related to effective management of heart failure  Planned Interventions:  Basic overview and discussion of pathophysiology of Heart Failure reviewed. The patient is doing well. His son states that he is stable and doing well. Denies any acute changes in his HF or heart health. Provided education on low sodium  diet. Review of heart healthy diet. The patients son states overall the patient is doing well with his eating. He tries to monitor it but is proud that he is keeping his weight around 200. He feels this is a plus as when it gets to be around 210 then there are issues. Education on monitoring for hidden sodium in foods.  Reviewed Heart Failure Action Plan in depth and provided written copy. Will send information through my Chart Assessed need for readable accurate scales in home. Has scales in the home. Weighs daily, son assist Provided education about placing scale on hard, flat surface Advised patient to weigh each morning after emptying bladder Discussed importance of daily weight and advised patient to weigh and record daily. The patients son states that the patient weighs daily. Weight today is 200. The son states if he can keep his weight around 200 this is good for the patient if it gets to 210 that is when the patient has trouble.  Education on calling the provider for 2/3 + weight gain in one day or 5 pounds in one week. Education on weight being a key factor in helping with heart failure exacerbation. Reviewed role of diuretics in prevention of fluid overload and management of heart failure. Works with the [place] D on a regular basis for effective management of medications. Discussed the importance of keeping all appointments with provider. Education on the recommendation of getting an appointment for follow up with the cardiologist at [facility] Provided patient with education about the role of exercise in the management of heart failure Advised patient to discuss parameters of taking Lasix, questions and concerns about heart failure and effective management of heart failure with provider. The patients son states instead of giving Lasix every other day like he was he is now giving every day and this seems to be working well for the patient. The patient has his portable oxygen now. The patients son states  they had to go with a new DME provider but have this now and the patient is using as prescribed. EF 65%-(08-2021) explained what the EF% was and how heart failure can change and progress Screening for signs and symptoms of depression related to chronic disease state  Assessed social determinant of health barriers  Symptom Management: Take medications as prescribed   Attend all scheduled provider appointments Call provider office for new concerns or questions  call the Suicide and Crisis Lifeline: 988 call the [place] National Suicide Prevention Lifeline: 425-760-4397 or TTY: 314-476-3485 TTY (915)638-2172) to talk to a trained counselor call 1-800-273-TALK (toll free, 24 hour hotline) if experiencing a Mental Health or [place]  call office if I gain more than 2 pounds in one day or 5 pounds in one week track weight in diary use salt in moderation watch for swelling in feet, ankles and legs every day weigh myself daily develop a rescue plan follow rescue plan if symptoms flare-up eat more whole grains, fruits and vegetables, lean meats and healthy fats track symptoms and what helps feel better or worse dress right for the weather, hot or cold  Follow Up Plan: Telephone follow up appointment with care management team member scheduled for: 12-07-2022 at 1030am  Plan:Telephone follow up appointment with care management team member scheduled for:  12-07-2022 at 98 am  [place], MSN, CCM RN Care Manager  Chronic Care Management Direct Number: [phone]

## 2022-10-05 NOTE — Patient Instructions (Signed)
Please call the care guide team at [phone] if you need to cancel or reschedule your appointment.   If you are experiencing a Mental Health or [place] or need someone to talk to, please call the Suicide and Crisis Lifeline: 988 call the [place] National Suicide Prevention Lifeline: 213-555-8227 or TTY: (254) 350-3092 TTY 337 871 7927) to talk to a trained counselor call 1-800-273-TALK (toll free, 24 hour hotline)   Following is a copy of the CCM Program Consent:  CCM service includes personalized support from designated clinical staff supervised by the physician, including individualized plan of care and coordination with other care providers 24/7 contact phone numbers for assistance for urgent and routine care needs. Service will only be billed when office clinical staff spend 20 minutes or more in a month to coordinate care. Only one practitioner may furnish and bill the service in a calendar month. The patient may stop CCM services at amy time (effective at the end of the month) by phone call to the office staff. The patient will be responsible for cost sharing (co-pay) or up to 20% of the service fee (after annual deductible is met)  Following is a copy of your full provider care plan:   Goals Addressed             This Visit's Progress    CCM Expected Outcome:  Monitor, Self-Manage and Reduce Symptoms of Afib       Current Barriers:  Knowledge Deficits related to medications that control AFIB Care Coordination needs related to cost effective medications to control AFIB and cost constraints in a patient with AFIB Chronic Disease Management support and education needs related to effective management of AFIB Financial Constraints.   Planned Interventions: Provider order and care plan reviewed. Collaborated with PharmD regarding patient care and plan. The patients granddaughter has talked with the pharm D and is checking on documents and other things needed to see if  the patient will qualify for assistance with Eliquis. Continues to work with the pharm D on a regular basis. Will continue to monitor for changes Counseled on increased risk of stroke due to Afib and benefits of anticoagulation for stroke prevention           Reviewed importance of adherence to anticoagulant exactly as prescribed. The patient is compliant with medications. The patients son states he is also working with the insurance agent on changing insurance providers to help with cost. Advised patient to discuss changes in AFIB with provider Counseled on bleeding risk associated with AFIB and importance of self-monitoring for signs/symptoms of bleeding Counseled on avoidance of NSAIDs due to increased bleeding risk with anticoagulants Counseled on importance of regular laboratory monitoring as prescribed. Has labs on a regular basis. No acute changes. Counseled on seeking medical attention after a head injury or if there is blood in the urine/stool Afib action plan reviewed The patient has upcoming appointment with the pharm D for medication cost effective measures and medications management . Screening for signs and symptoms of depression related to chronic disease state Assessed social determinant of health barriers  Symptom Management: Take medications as prescribed   Attend all scheduled provider appointments Call provider office for new concerns or questions  call the Suicide and Crisis Lifeline: 988 call the [place] National Suicide Prevention Lifeline: 779-712-9925 or TTY: 847-440-6935 TTY 934-527-6246) to talk to a trained counselor call 1-800-273-TALK (toll free, 24 hour hotline) if experiencing a Mental Health or [place]  - make a plan to exercise  regularly - make a plan to eat healthy - keep all lab appointments - take medicine as prescribed  Follow Up Plan: Telephone follow up appointment with care management team member scheduled for: 12-07-2022 at 1030am        CCM Expected Outcome:  Monitor, Self-Manage and Reduce Symptoms of Heart Failure       Current Barriers:  Knowledge Deficits related to parameters to take Lasix, monitoring for changes in heart failure and plan of care for heart failure Care Coordination needs related to resources and education needs for managing heart failure in a patient with heart failure Chronic Disease Management support and education needs related to effective management of heart failure  Planned Interventions: Basic overview and discussion of pathophysiology of Heart Failure reviewed. The patient is doing well. His son states that he is stable and doing well. Denies any acute changes in his HF or heart health. Provided education on low sodium diet. Review of heart healthy diet. The patients son states overall the patient is doing well with his eating. He tries to monitor it but is proud that he is keeping his weight around 200. He feels this is a plus as when it gets to be around 210 then there are issues. Education on monitoring for hidden sodium in foods.  Reviewed Heart Failure Action Plan in depth and provided written copy. Will send information through my Chart Assessed need for readable accurate scales in home. Has scales in the home. Weighs daily, son assist Provided education about placing scale on hard, flat surface Advised patient to weigh each morning after emptying bladder Discussed importance of daily weight and advised patient to weigh and record daily. The patients son states that the patient weighs daily. Weight today is 200. The son states if he can keep his weight around 200 this is good for the patient if it gets to 210 that is when the patient has trouble.  Education on calling the provider for 2/3 + weight gain in one day or 5 pounds in one week. Education on weight being a key factor in helping with heart failure exacerbation. Reviewed role of diuretics in prevention of fluid overload and management of  heart failure. Works with the [place] D on a regular basis for effective management of medications. Discussed the importance of keeping all appointments with provider. Education on the recommendation of getting an appointment for follow up with the cardiologist at [facility] Provided patient with education about the role of exercise in the management of heart failure Advised patient to discuss parameters of taking Lasix, questions and concerns about heart failure and effective management of heart failure with provider. The patients son states instead of giving Lasix every other day like he was he is now giving every day and this seems to be working well for the patient. The patient has his portable oxygen now. The patients son states they had to go with a new DME provider but have this now and the patient is using as prescribed. EF 65%-(08-2021) explained what the EF% was and how heart failure can change and progress Screening for signs and symptoms of depression related to chronic disease state  Assessed social determinant of health barriers  Symptom Management: Take medications as prescribed   Attend all scheduled provider appointments Call provider office for new concerns or questions  call the Suicide and Crisis Lifeline: 988 call the [place] National Suicide Prevention Lifeline: 204-164-6569 or TTY: 517-827-6812 TTY 351-191-9090) to talk to a trained counselor call 1-800-273-TALK (  toll free, 24 hour hotline) if experiencing a Mental Health or [place]  call office if I gain more than 2 pounds in one day or 5 pounds in one week track weight in diary use salt in moderation watch for swelling in feet, ankles and legs every day weigh myself daily develop a rescue plan follow rescue plan if symptoms flare-up eat more whole grains, fruits and vegetables, lean meats and healthy fats track symptoms and what helps feel better or worse dress right for the weather, hot or cold  Follow Up  Plan: Telephone follow up appointment with care management team member scheduled for: 12-07-2022 at 1030am          Patient verbalizes understanding of instructions and care plan provided today and agrees to view in [place]. Active MyChart status and patient understanding of how to access instructions and care plan via MyChart confirmed with patient.     Telephone follow up appointment with care management team member scheduled for: 12-07-2022 at 37 am

## 2022-10-09 DIAGNOSIS — H25813 Combined forms of age-related cataract, bilateral: Secondary | ICD-10-CM | POA: Diagnosis not present

## 2022-10-09 DIAGNOSIS — I10 Essential (primary) hypertension: Secondary | ICD-10-CM | POA: Diagnosis not present

## 2022-10-09 DIAGNOSIS — H524 Presbyopia: Secondary | ICD-10-CM | POA: Diagnosis not present

## 2022-10-09 DIAGNOSIS — H35033 Hypertensive retinopathy, bilateral: Secondary | ICD-10-CM | POA: Diagnosis not present

## 2022-10-09 DIAGNOSIS — H5015 Alternating exotropia: Secondary | ICD-10-CM | POA: Diagnosis not present

## 2022-10-13 ENCOUNTER — Ambulatory Visit: Payer: PPO | Admitting: Family Medicine

## 2022-10-13 ENCOUNTER — Ambulatory Visit (INDEPENDENT_AMBULATORY_CARE_PROVIDER_SITE_OTHER): Payer: PPO | Admitting: Family Medicine

## 2022-10-13 ENCOUNTER — Encounter: Payer: Self-pay | Admitting: Family Medicine

## 2022-10-13 VITALS — BP 124/82 | HR 81 | Ht 67.0 in | Wt 204.8 lb

## 2022-10-13 DIAGNOSIS — M62830 Muscle spasm of back: Secondary | ICD-10-CM | POA: Diagnosis not present

## 2022-10-13 DIAGNOSIS — M5442 Lumbago with sciatica, left side: Secondary | ICD-10-CM | POA: Diagnosis not present

## 2022-10-13 MED ORDER — GABAPENTIN 100 MG PO CAPS: 100.0000 mg | ORAL_CAPSULE | Freq: Every day | ORAL | 1 refills | Status: DC

## 2022-10-13 MED ORDER — PREDNISONE 10 MG PO TABS: ORAL_TABLET | ORAL | 0 refills | Status: DC

## 2022-10-13 NOTE — Patient Instructions (Addendum)
Thank you for coming to the office today.  1. For your Back Pain - I think that this is due to Muscle Spasms or strain. Your Sciatic Nerve can be affected causing some of your radiation and numbness down your legs.  Start Prednisone taper 6 days  Start Gabapentin '100mg'$  at night for 2-4 weeks then stop. If need to take more than 1 can go up to 2 or 3 max.  Recommend to start taking Tylenol Extra Strength '500mg'$  tabs - take 1 to 2 tabs per dose (max '1000mg'$ ) every 6-8 hours for pain (take regularly, don't skip a dose for next 7 days), max 24 hour daily dose is 6 tablets or '3000mg'$ . In the future you can repeat the same everyday Tylenol course for 1-2 weeks at a time.   Recommend to start using heating pad on your lower back 1-2x daily for few weeks  This pain may take weeks to months to fully resolve, but hopefully it will respond to the medicine initially. All back injuries (small or serious) are slow to heal since we use our back muscles every day. Be careful with turning, twisting, lifting, sitting / standing for prolonged periods, and avoid re-injury.  If your symptoms significantly worsen with more pain, or new symptoms with weakness in one or both legs, new or different shooting leg pains, numbness in legs or groin, loss of control or retention of urine or bowel movements, please call back for advice and you may need to go directly to the Emergency Department.   Please schedule a Follow-up Appointment to: Return in about 4 weeks (around 11/10/2022), or if symptoms worsen or fail to improve, for back pain if not improved.  If you have any other questions or concerns, please feel free to call the office or send a message through [place]. You may also schedule an earlier appointment if necessary.  Additionally, you may be receiving a survey about your experience at our office within a few days to 1 week by e-mail or mail. We value your feedback.  Nobie Putnam, DO [place]

## 2022-10-13 NOTE — Progress Notes (Signed)
Subjective:    Patient ID: Robert [name], male    DOB: September 28, 1931, 89 y.o.   MRN: [id number]  Robert [name] is a 89 y.o. male presenting on 10/13/2022 for Back Pain   HPI  Left mid Back Pain, Muscle Spasm Reports recent flare up, no injury or trauma, Left sided mid back with spasm reported feels tight and like a "grip" it is worse with some movements and bending. He has put a salve on it and it has helped some. Not taking Tylenol every day. No on other medication for back. Has a burning pain localized but not radiating into leg. He has known knee arthritis and other joint pains.      10/13/2022    1:48 PM 06/22/2022   10:33 AM 08/23/2021   11:14 AM  Depression screen PHQ 2/9  Decreased Interest 0 0 0  Down, Depressed, Hopeless 0 0 0  PHQ - 2 Score 0 0 0  Altered sleeping 0  0  Tired, decreased energy 0  0  Change in appetite 0  0  Feeling bad or failure about yourself  0  0  Trouble concentrating 0  0  Moving slowly or fidgety/restless 0  0  Suicidal thoughts 0  0  PHQ-9 Score 0  0  Difficult doing work/chores Not difficult at all  Not difficult at all    Social History   Tobacco Use   Smoking status: Former    Types: Cigarettes    Quit date: 1985    Years since quitting: 39.2   Smokeless tobacco: Former  Scientific laboratory technician Use: Never used  Substance Use Topics   Alcohol use: Never   Drug use: Never    Review of Systems Per HPI unless specifically indicated above     Objective:    BP 124/82   Pulse 81   Ht '5\' 7"'$  (1.702 m)   Wt 204 lb 12.8 oz (92.9 kg)   SpO2 92%   BMI 32.08 kg/m   Wt Readings from Last 3 Encounters:  10/13/22 204 lb 12.8 oz (92.9 kg)  07/06/22 198 lb (89.8 kg)  05/26/22 199 lb (90.3 kg)    Physical Exam Vitals and nursing note reviewed.  Constitutional:      General: He is not in acute distress.    Appearance: He is well-developed. He is not diaphoretic.     Comments: Well-appearing, comfortable, cooperative  HENT:     Head:  Normocephalic and atraumatic.  Eyes:     General:        Right eye: No discharge.        Left eye: No discharge.     Conjunctiva/sclera: Conjunctivae normal.  Neck:     Thyroid: No thyromegaly.  Cardiovascular:     Rate and Rhythm: Normal rate and regular rhythm.     Pulses: Normal pulses.     Heart sounds: Normal heart sounds. No murmur heard. Pulmonary:     Effort: Pulmonary effort is normal. No respiratory distress.     Breath sounds: Normal breath sounds. No wheezing or rales.  Musculoskeletal:        General: Normal range of motion.     Cervical back: Normal range of motion and neck supple.     Comments: Low Back Inspection: Normal appearance, no spinal deformity, symmetrical. Palpation: No tenderness over spinous processes. LEFT > R paraspinal muscle spasm hypertonicity ROM: Full active ROM forward flex / back extension, rotation L/R without discomfort Special Testing: Seated  SLR negative for radicular pain bilaterally  Strength: Bilateral hip flex/ext 5/5, knee flex/ext 5/5, ankle dorsiflex/plantarflex 5/5 Neurovascular: intact distal sensation to light touch   Lymphadenopathy:     Cervical: No cervical adenopathy.  Skin:    General: Skin is warm and dry.     Findings: No erythema or rash.  Neurological:     Mental Status: He is alert and oriented to person, place, and time. Mental status is at baseline.  Psychiatric:        Behavior: Behavior normal.     Comments: Well groomed, good eye contact, normal speech and thoughts    Results for orders placed or performed in visit on [id number]  BASIC METABOLIC PANEL WITH GFR  Result Value Ref Range   Glucose, Bld 88 65 - 139 mg/dL   BUN 42 (H) 7 - 25 mg/dL   Creat 1.63 (H) 0.70 - 1.22 mg/dL   eGFR 41 (L) > OR = 60 mL/min/1.35m   BUN/Creatinine Ratio 26 (H) 6 - 22 (calc)   Sodium 139 135 - 146 mmol/L   Potassium 5.3 3.5 - 5.3 mmol/L   Chloride 105 98 - 110 mmol/L   CO2 29 20 - 32 mmol/L   Calcium 9.9 8.6 - 10.3 mg/dL   CBC with Differential/Platelet  Result Value Ref Range   WBC 15.9 (H) 3.8 - 10.8 Thousand/uL   RBC 5.43 4.20 - 5.80 Million/uL   Hemoglobin 13.3 13.2 - 17.1 g/dL   HCT 42.9 38.5 - 50.0 %   MCV 79.0 (L) 80.0 - 100.0 fL   MCH 24.5 (L) 27.0 - 33.0 pg   MCHC 31.0 (L) 32.0 - 36.0 g/dL   RDW 17.1 (H) 11.0 - 15.0 %   Platelets 204 140 - 400 Thousand/uL   MPV  7.5 - 12.5 fL   Neutro Abs 4,897 1,500 - 7,800 cells/uL   Lymphs Abs 8,888 (H) 850 - 3,900 cells/uL   Absolute Monocytes 1,717 (H) 200 - 950 cells/uL   Eosinophils Absolute 254 15 - 500 cells/uL   Basophils Absolute 143 0 - 200 cells/uL   Neutrophils Relative % 30.8 %   Total Lymphocyte 55.9 %   Monocytes Relative 10.8 %   Eosinophils Relative 1.6 %   Basophils Relative 0.9 %   Smear Review    Magnesium  Result Value Ref Range   Magnesium 2.1 1.5 - 2.5 mg/dL      Assessment & Plan:   Problem List Items Addressed This Visit   None Visit Diagnoses     Acute left-sided low back pain with left-sided sciatica    -  Primary   Relevant Medications   predniSONE (DELTASONE) 10 MG tablet   gabapentin (NEURONTIN) 100 MG capsule   Muscle spasm of back       Relevant Medications   predniSONE (DELTASONE) 10 MG tablet   gabapentin (NEURONTIN) 100 MG capsule       Subacute on chronic L LBP with associated localized sciatica symptoms but not radiculopathy into leg Muscle spasm hypertonicity on exam. Known OA/DJD other joints, knees especially - No red flag symptoms. Negative SLR for radiculopathy  Plan: 1. Start Prednisone taper 6 day 2. Gabapentin '100mg'$  to max '300mg'$  AT NIGHT 2-4 weeks then phase off 3. Tylenol regular dosing 4. Encouraged use of heating pad 1-2x daily for now then PRN 5.  Follow-up 4-6 weeks if not improved for re-evaluation, consider X-ray imaging, trial of PT, and possibly referral to Orthopedic   Meds ordered this encounter  Medications   predniSONE (DELTASONE) 10 MG tablet    Sig: Take 6 tabs with  breakfast Day 1, 5 tabs Day 2, 4 tabs Day 3, 3 tabs Day 4, 2 tabs Day 5, 1 tab Day 6.    Dispense:  21 tablet    Refill:  0   gabapentin (NEURONTIN) 100 MG capsule    Sig: Take 1 capsule (100 mg total) by mouth at bedtime.    Dispense:  30 capsule    Refill:  1      Follow up plan: Return in about 4 weeks (around 11/10/2022), or if symptoms worsen or fail to improve, for back pain if not improved.  Nobie Putnam, [facility] 10/13/2022, 2:11 PM

## 2022-10-23 DIAGNOSIS — H903 Sensorineural hearing loss, bilateral: Secondary | ICD-10-CM | POA: Diagnosis not present

## 2022-10-31 DIAGNOSIS — J9611 Chronic respiratory failure with hypoxia: Secondary | ICD-10-CM | POA: Diagnosis not present

## 2022-10-31 DIAGNOSIS — J84112 Idiopathic pulmonary fibrosis: Secondary | ICD-10-CM | POA: Diagnosis not present

## 2022-12-01 DIAGNOSIS — J9611 Chronic respiratory failure with hypoxia: Secondary | ICD-10-CM | POA: Diagnosis not present

## 2022-12-01 DIAGNOSIS — J84112 Idiopathic pulmonary fibrosis: Secondary | ICD-10-CM | POA: Diagnosis not present

## 2022-12-02 ENCOUNTER — Other Ambulatory Visit: Payer: Self-pay | Admitting: Family Medicine

## 2022-12-02 DIAGNOSIS — F5101 Primary insomnia: Secondary | ICD-10-CM

## 2022-12-04 NOTE — Telephone Encounter (Signed)
Requested Prescriptions  Pending Prescriptions Disp Refills   traZODone (DESYREL) 50 MG tablet [Pharmacy Med Name: TRAZODONE 50 MG TABLET] 90 tablet 0    Sig: Take 1 tablet (50 mg total) by mouth at bedtime.     Psychiatry: Antidepressants - Serotonin Modulator Passed - 12/02/2022 11:06 AM      Passed - Valid encounter within last 6 months    Recent Outpatient Visits           1 month ago Acute left-sided low back pain with left-sided sciatica   [place] [facility] [place], Netta Neat, DO   5 months ago Primary osteoarthritis of right knee   [place] [facility] Smitty Cords, DO   6 months ago Chronic heart failure with preserved ejection fraction [facility])   [place] [facility] Smitty Cords, DO   9 months ago Primary osteoarthritis of right knee   [place] [facility] Smitty Cords, DO   1 year ago Bilateral hearing loss, unspecified hearing loss type   [facility] [place], Netta Neat, [place]

## 2022-12-07 ENCOUNTER — Ambulatory Visit (INDEPENDENT_AMBULATORY_CARE_PROVIDER_SITE_OTHER): Payer: PPO

## 2022-12-07 ENCOUNTER — Telehealth: Payer: PPO

## 2022-12-07 DIAGNOSIS — I5032 Chronic diastolic (congestive) heart failure: Secondary | ICD-10-CM

## 2022-12-07 DIAGNOSIS — I4821 Permanent atrial fibrillation: Secondary | ICD-10-CM

## 2022-12-07 NOTE — Chronic Care Management (AMB) (Signed)
Chronic Care Management   CCM RN Visit Note  12/07/2022 Name: Robert [name] MRN: [id number] DOB: 1932/02/16  Subjective: Robert [name] is a 89 y.o. year old male who is a primary care patient of Smitty Cords, DO. The patient was referred to the Chronic Care Management team for assistance with care management needs subsequent to provider initiation of CCM services and plan of care.    Today's Visit:   Spoke with the patients son, Robert [name]  for follow up visit.        Goals Addressed             This Visit's Progress    CCM Expected Outcome:  Monitor, Self-Manage and Reduce Symptoms of Afib       Current Barriers:  Knowledge Deficits related to medications that control AFIB Care Coordination needs related to cost effective medications to control AFIB and cost constraints in a patient with AFIB Chronic Disease Management support and education needs related to effective management of AFIB Financial Constraints.   Planned Interventions: Provider order and care plan reviewed. Collaborated with PharmD regarding patient care and plan. The patients granddaughter has talked with the pharm D and is checking on documents and other things needed to see if the patient will qualify for assistance with Eliquis. Continues to work with the pharm D on a regular basis. Will continue to monitor for changes Counseled on increased risk of stroke due to Afib and benefits of anticoagulation for stroke prevention           Reviewed importance of adherence to anticoagulant exactly as prescribed. The patient is compliant with medications. The patients son states he is also working with the insurance agent on changing insurance providers to help with cost. Advised patient to discuss changes in AFIB with provider. AFIB is stable at this time. Denies any new issues related to AFIB Counseled on bleeding risk associated with AFIB and importance of self-monitoring for signs/symptoms of bleeding Counseled on  avoidance of NSAIDs due to increased bleeding risk with anticoagulants Counseled on importance of regular laboratory monitoring as prescribed. Has labs on a regular basis. No acute changes. Counseled on seeking medical attention after a head injury or if there is blood in the urine/stool Afib action plan reviewed The patient has upcoming appointment with the pharm D for medication cost effective measures and medications management. Continues to work with pharm D for medication reconciliation and education needs. Screening for signs and symptoms of depression related to chronic disease state Assessed social determinant of health barriers  Symptom Management: Take medications as prescribed   Attend all scheduled provider appointments Call provider office for new concerns or questions  call the Suicide and Crisis Lifeline: 988 call the [place] National Suicide Prevention Lifeline: 680 643 5263 or TTY: 913-375-5904 TTY 209-696-6920) to talk to a trained counselor call 1-800-273-TALK (toll free, 24 hour hotline) if experiencing a Mental Health or Behavioral Health Crisis  - make a plan to exercise regularly - make a plan to eat healthy - keep all lab appointments - take medicine as prescribed  Follow Up Plan: Telephone follow up appointment with care management team member scheduled for: 02-06-2023 at 1145 am       CCM Expected Outcome:  Monitor, Self-Manage and Reduce Symptoms of Heart Failure       Current Barriers:  Knowledge Deficits related to parameters to take Lasix, monitoring for changes in heart failure and plan of care for heart failure Care Coordination needs related to resources and education  needs for managing heart failure in a patient with heart failure Chronic Disease Management support and education needs related to effective management of heart failure  Planned Interventions: Basic overview and discussion of pathophysiology of Heart Failure reviewed. The patient is doing  well. His son states that he is stable and doing well. Denies any acute changes in his HF or heart health. Provided education on low sodium diet. Review of heart healthy diet. The patients son states overall the patient is doing well with his eating. He tries to monitor it but is proud that he is keeping his weight around 200. He feels this is a plus as when it gets to be around 208 to 210 then there are issues. Education on monitoring for hidden sodium in foods.  Reviewed Heart Failure Action Plan in depth and provided written copy. Will send information through my Chart Assessed need for readable accurate scales in home. Has scales in the home. Weighs daily, son assist. The patients weight is staying around 205 to 207. He had a cold last month and had some extra congestion. His son states that once he got over the cold his breathing was better and his weight went down. Education and support given.  Provided education about placing scale on hard, flat surface Advised patient to weigh each morning after emptying bladder Discussed importance of daily weight and advised patient to weigh and record daily. The patients son states that the patient weighs daily. Weight today is 205. The son states if he can keep his weight around 200 this is good for the patient if it gets to 208 to 210 that is when the patient has trouble.  Education on calling the provider for 2/3 + weight gain in one day or 5 pounds in one week. Education on weight being a key factor in helping with heart failure exacerbation. Reviewed role of diuretics in prevention of fluid overload and management of heart failure. Works with the pharm D on a regular basis for effective management of medications. Discussed the importance of keeping all appointments with provider. Education on the recommendation of getting an appointment for follow up with the cardiologist at [facility] Provided patient with education about the role of exercise in the management of  heart failure Advised patient to discuss parameters of taking Lasix, questions and concerns about heart failure and effective management of heart failure with provider. The patients son states instead of giving Lasix every other day like he was he is now giving every day and this seems to be working well for the patient. The patient has his portable oxygen now. The patients son states they had to go with a new DME provider but have this now and the patient is using as prescribed. The son feels like he has become dependent on the portable oxygen. EF 65%-(08-2021) explained what the EF% was and how heart failure can change and progress Screening for signs and symptoms of depression related to chronic disease state  Assessed social determinant of health barriers The patient is still having some problems with back pain and discomfort. He is using the pain patches and icy hot. The son states the patient is sitting a lot and he encourages him to move around and get exercise. He said when he was taking the prednisone he was having some better relief. He states that seems to be the only real issue he is having right now. Will collaborate with the pcp for recommendations and will follow up accordingly with the  patients son.   Symptom Management: Take medications as prescribed   Attend all scheduled provider appointments Call provider office for new concerns or questions  call the Suicide and Crisis Lifeline: 988 call the [place] National Suicide Prevention Lifeline: 681-709-6970 or TTY: 984-460-4339 TTY 331-160-1104) to talk to a trained counselor call 1-800-273-TALK (toll free, 24 hour hotline) if experiencing a Mental Health or Behavioral Health Crisis  call office if I gain more than 2 pounds in one day or 5 pounds in one week track weight in diary use salt in moderation watch for swelling in feet, ankles and legs every day weigh myself daily develop a rescue plan follow rescue plan if symptoms  flare-up eat more whole grains, fruits and vegetables, lean meats and healthy fats track symptoms and what helps feel better or worse dress right for the weather, hot or cold  Follow Up Plan: Telephone follow up appointment with care management team member scheduled for: 02-06-2023 at 1145 am          Plan:Telephone follow up appointment with care management team member scheduled for:  02-06-2023 at 1145 am  Alto Denver RN, MSN, CCM RN Care Manager  Chronic Care Management Direct Number: [phone]

## 2022-12-07 NOTE — Patient Instructions (Signed)
Please call the care guide team at [phone] if you need to cancel or reschedule your appointment.   If you are experiencing a Mental Health or Behavioral Health Crisis or need someone to talk to, please call the Suicide and Crisis Lifeline: 988 call the [place] National Suicide Prevention Lifeline: 352-603-5158 or TTY: 514-228-8111 TTY 432-551-6195) to talk to a trained counselor call 1-800-273-TALK (toll free, 24 hour hotline)   Following is a copy of the CCM Program Consent:  CCM service includes personalized support from designated clinical staff supervised by the physician, including individualized plan of care and coordination with other care providers 24/7 contact phone numbers for assistance for urgent and routine care needs. Service will only be billed when office clinical staff spend 20 minutes or more in a month to coordinate care. Only one practitioner may furnish and bill the service in a calendar month. The patient may stop CCM services at amy time (effective at the end of the month) by phone call to the office staff. The patient will be responsible for cost sharing (co-pay) or up to 20% of the service fee (after annual deductible is met)  Following is a copy of your full provider care plan:   Goals Addressed             This Visit's Progress    CCM Expected Outcome:  Monitor, Self-Manage and Reduce Symptoms of Afib       Current Barriers:  Knowledge Deficits related to medications that control AFIB Care Coordination needs related to cost effective medications to control AFIB and cost constraints in a patient with AFIB Chronic Disease Management support and education needs related to effective management of AFIB Financial Constraints.   Planned Interventions: Provider order and care plan reviewed. Collaborated with PharmD regarding patient care and plan. The patients granddaughter has talked with the pharm D and is checking on documents and other things needed to see if  the patient will qualify for assistance with Eliquis. Continues to work with the pharm D on a regular basis. Will continue to monitor for changes Counseled on increased risk of stroke due to Afib and benefits of anticoagulation for stroke prevention           Reviewed importance of adherence to anticoagulant exactly as prescribed. The patient is compliant with medications. The patients son states he is also working with the insurance agent on changing insurance providers to help with cost. Advised patient to discuss changes in AFIB with provider. AFIB is stable at this time. Denies any new issues related to AFIB Counseled on bleeding risk associated with AFIB and importance of self-monitoring for signs/symptoms of bleeding Counseled on avoidance of NSAIDs due to increased bleeding risk with anticoagulants Counseled on importance of regular laboratory monitoring as prescribed. Has labs on a regular basis. No acute changes. Counseled on seeking medical attention after a head injury or if there is blood in the urine/stool Afib action plan reviewed The patient has upcoming appointment with the pharm D for medication cost effective measures and medications management. Continues to work with pharm D for medication reconciliation and education needs. Screening for signs and symptoms of depression related to chronic disease state Assessed social determinant of health barriers  Symptom Management: Take medications as prescribed   Attend all scheduled provider appointments Call provider office for new concerns or questions  call the Suicide and Crisis Lifeline: 988 call the [place] National Suicide Prevention Lifeline: 667-035-4064 or TTY: 850-252-8852 TTY 743-328-1110) to talk to a trained  counselor call 1-800-273-TALK (toll free, 24 hour hotline) if experiencing a Mental Health or Behavioral Health Crisis  - make a plan to exercise regularly - make a plan to eat healthy - keep all lab appointments -  take medicine as prescribed  Follow Up Plan: Telephone follow up appointment with care management team member scheduled for: 02-06-2023 at 1145 am       CCM Expected Outcome:  Monitor, Self-Manage and Reduce Symptoms of Heart Failure       Current Barriers:  Knowledge Deficits related to parameters to take Lasix, monitoring for changes in heart failure and plan of care for heart failure Care Coordination needs related to resources and education needs for managing heart failure in a patient with heart failure Chronic Disease Management support and education needs related to effective management of heart failure  Planned Interventions: Basic overview and discussion of pathophysiology of Heart Failure reviewed. The patient is doing well. His son states that he is stable and doing well. Denies any acute changes in his HF or heart health. Provided education on low sodium diet. Review of heart healthy diet. The patients son states overall the patient is doing well with his eating. He tries to monitor it but is proud that he is keeping his weight around 200. He feels this is a plus as when it gets to be around 208 to 210 then there are issues. Education on monitoring for hidden sodium in foods.  Reviewed Heart Failure Action Plan in depth and provided written copy. Will send information through my Chart Assessed need for readable accurate scales in home. Has scales in the home. Weighs daily, son assist. The patients weight is staying around 205 to 207. He had a cold last month and had some extra congestion. His son states that once he got over the cold his breathing was better and his weight went down. Education and support given.  Provided education about placing scale on hard, flat surface Advised patient to weigh each morning after emptying bladder Discussed importance of daily weight and advised patient to weigh and record daily. The patients son states that the patient weighs daily. Weight today is  205. The son states if he can keep his weight around 200 this is good for the patient if it gets to 208 to 210 that is when the patient has trouble.  Education on calling the provider for 2/3 + weight gain in one day or 5 pounds in one week. Education on weight being a key factor in helping with heart failure exacerbation. Reviewed role of diuretics in prevention of fluid overload and management of heart failure. Works with the pharm D on a regular basis for effective management of medications. Discussed the importance of keeping all appointments with provider. Education on the recommendation of getting an appointment for follow up with the cardiologist at [facility] Provided patient with education about the role of exercise in the management of heart failure Advised patient to discuss parameters of taking Lasix, questions and concerns about heart failure and effective management of heart failure with provider. The patients son states instead of giving Lasix every other day like he was he is now giving every day and this seems to be working well for the patient. The patient has his portable oxygen now. The patients son states they had to go with a new DME provider but have this now and the patient is using as prescribed. The son feels like he has become dependent on the portable oxygen.  EF 65%-(08-2021) explained what the EF% was and how heart failure can change and progress Screening for signs and symptoms of depression related to chronic disease state  Assessed social determinant of health barriers The patient is still having some problems with back pain and discomfort. He is using the pain patches and icy hot. The son states the patient is sitting a lot and he encourages him to move around and get exercise. He said when he was taking the prednisone he was having some better relief. He states that seems to be the only real issue he is having right now. Will collaborate with the pcp for recommendations and will  follow up accordingly with the patients son.   Symptom Management: Take medications as prescribed   Attend all scheduled provider appointments Call provider office for new concerns or questions  call the Suicide and Crisis Lifeline: 988 call the [place] National Suicide Prevention Lifeline: (917)085-8180 or TTY: 510-436-7856 TTY 307-774-4494) to talk to a trained counselor call 1-800-273-TALK (toll free, 24 hour hotline) if experiencing a Mental Health or Behavioral Health Crisis  call office if I gain more than 2 pounds in one day or 5 pounds in one week track weight in diary use salt in moderation watch for swelling in feet, ankles and legs every day weigh myself daily develop a rescue plan follow rescue plan if symptoms flare-up eat more whole grains, fruits and vegetables, lean meats and healthy fats track symptoms and what helps feel better or worse dress right for the weather, hot or cold  Follow Up Plan: Telephone follow up appointment with care management team member scheduled for: 02-06-2023 at 1145 am          Patient verbalizes understanding of instructions and care plan provided today and agrees to view in MyChart. Active MyChart status and patient understanding of how to access instructions and care plan via MyChart confirmed with patient.  Telephone follow up appointment with care management team member scheduled for: 02-06-2023 at 1145 am

## 2022-12-08 ENCOUNTER — Ambulatory Visit: Payer: Self-pay

## 2022-12-08 DIAGNOSIS — I4821 Permanent atrial fibrillation: Secondary | ICD-10-CM

## 2022-12-08 DIAGNOSIS — I5032 Chronic diastolic (congestive) heart failure: Secondary | ICD-10-CM

## 2022-12-08 DIAGNOSIS — M5442 Lumbago with sciatica, left side: Secondary | ICD-10-CM

## 2022-12-08 NOTE — Chronic Care Management (AMB) (Signed)
Chronic Care Management   CCM RN Visit Note  12/08/2022 Name: Robert [name] MRN: [id number] DOB: [birth year]  Subjective: Robert [name] is a 89 y.o. year old male who is a primary care patient of Robert Cords, DO. The patient was referred to the Chronic Care Management team for assistance with care management needs subsequent to provider initiation of CCM services and plan of care.    Today's Visit:   Spoke to the patients son, Robert [name]  for  follow up on pcp recommendations after collaboration with the pcp for back pain .        Goals Addressed             This Visit's Progress    CCM Expected Outcome:  Monitor, Self-Manage and Reduce Symptoms of Afib       Current Barriers:  Knowledge Deficits related to medications that control AFIB Care Coordination needs related to cost effective medications to control AFIB and cost constraints in a patient with AFIB Chronic Disease Management support and education needs related to effective management of AFIB Financial Constraints.   Planned Interventions: Provider order and care plan reviewed. Collaborated with PharmD regarding patient care and plan. The patients granddaughter has talked with the pharm D and is checking on documents and other things needed to see if the patient will qualify for assistance with Eliquis. Continues to work with the pharm D on a regular basis. Will continue to monitor for changes Counseled on increased risk of stroke due to Afib and benefits of anticoagulation for stroke prevention           Reviewed importance of adherence to anticoagulant exactly as prescribed. The patient is compliant with medications. The patients son states he is also working with the insurance agent on changing insurance providers to help with cost. Education on the medication of Gabapentin with the potential to cause drowsiness. The patient is having back pain and the son instructed on trying to increase the dose of Gabapentin to  help with the "burning pain in his back", but monitoring for drowsiness, per the pcp instructions. The patients son verbalized understanding.  Advised patient to discuss changes in AFIB with provider. AFIB is stable at this time. Denies any new issues related to AFIB Counseled on bleeding risk associated with AFIB and importance of self-monitoring for signs/symptoms of bleeding Counseled on avoidance of NSAIDs due to increased bleeding risk with anticoagulants Counseled on importance of regular laboratory monitoring as prescribed. Has labs on a regular basis. No acute changes. Counseled on seeking medical attention after a head injury or if there is blood in the urine/stool Afib action plan reviewed The patient has upcoming appointment with the pharm D for medication cost effective measures and medications management. Continues to work with pharm D for medication reconciliation and education needs. Screening for signs and symptoms of depression related to chronic disease state Assessed social determinant of health barriers  Symptom Management: Take medications as prescribed   Attend all scheduled provider appointments Call provider office for new concerns or questions  call the Suicide and Crisis Lifeline: 988 call the [place] National Suicide Prevention Lifeline: 413-864-2474 or TTY: (317)802-6915 TTY (225)746-0215) to talk to a trained counselor call 1-800-273-TALK (toll free, 24 hour hotline) if experiencing a Mental Health or Behavioral Health Crisis  - make a plan to exercise regularly - make a plan to eat healthy - keep all lab appointments - take medicine as prescribed  Follow Up Plan: Telephone follow up appointment with  care management team member scheduled for: 02-06-2023 at 1145 am       CCM Expected Outcome:  Monitor, Self-Manage and Reduce Symptoms of Heart Failure       Current Barriers:  Knowledge Deficits related to parameters to take Lasix, monitoring for changes in heart  failure and plan of care for heart failure Care Coordination needs related to resources and education needs for managing heart failure in a patient with heart failure Chronic Disease Management support and education needs related to effective management of heart failure  Planned Interventions: Basic overview and discussion of pathophysiology of Heart Failure reviewed. The patient is doing well. His son states that he is stable and doing well. Denies any acute changes in his HF or heart health. Provided education on low sodium diet. Review of heart healthy diet. The patients son states overall the patient is doing well with his eating. He tries to monitor it but is proud that he is keeping his weight around 200. He feels this is a plus as when it gets to be around 208 to 210 then there are issues. Education on monitoring for hidden sodium in foods.  Reviewed Heart Failure Action Plan in depth and provided written copy. Will send information through my Chart Assessed need for readable accurate scales in home. Has scales in the home. Weighs daily, son assist. The patients weight is staying around 205 to 207. He had a cold last month and had some extra congestion. His son states that once he got over the cold his breathing was better and his weight went down. Education and support given.  Provided education about placing scale on hard, flat surface Advised patient to weigh each morning after emptying bladder Discussed importance of daily weight and advised patient to weigh and record daily. The patients son states that the patient weighs daily. Weight today is 205. The son states if he can keep his weight around 200 this is good for the patient if it gets to 208 to 210 that is when the patient has trouble.  Education on calling the provider for 2/3 + weight gain in one day or 5 pounds in one week. Education on weight being a key factor in helping with heart failure exacerbation. Reviewed role of diuretics in  prevention of fluid overload and management of heart failure. Works with the pharm D on a regular basis for effective management of medications. Discussed the importance of keeping all appointments with provider. Education on the recommendation of getting an appointment for follow up with the cardiologist at [facility] Provided patient with education about the role of exercise in the management of heart failure Advised patient to discuss parameters of taking Lasix, questions and concerns about heart failure and effective management of heart failure with provider. The patients son states instead of giving Lasix every other day like he was he is now giving every day and this seems to be working well for the patient. The patient has his portable oxygen now. The patients son states they had to go with a new DME provider but have this now and the patient is using as prescribed. The son feels like he has become dependent on the portable oxygen. EF 65%-(08-2021) explained what the EF% was and how heart failure can change and progress Screening for signs and symptoms of depression related to chronic disease state  Assessed social determinant of health barriers The patient is still having some problems with back pain and discomfort. He is using the pain  patches and icy hot. The son states the patient is sitting a lot and he encourages him to move around and get exercise. He said when he was taking the prednisone he was having some better relief. He states that seems to be the only real issue he is having right now. Will collaborate with the pcp for recommendations and will follow up accordingly with the patients son. Call made back to the son today after collaboration with the pcp. The patients son states that he does not think his dad has been taking the gabapentin until this week. Review of the recommendations of the pcp. The son wrote down the information and is going to check the bottle when he gets home. Instructed the  son that the patient can take up to 300 mg at bedtime or if he is having moderate to severe pain in his back during the day he can take 100 mg in the am, 100 mg in the afternoon and 300 mg at bedtime.  The patients son will try this. Also instructed that we could order more prednisone but would not want to repeat this too often. He will try the gabapentin first and get back with the [facility] after trying as the pcp has recommended. No other needs expressed at this time. Will continue to monitor and work with the patient and the patients son accordingly.   Symptom Management: Take medications as prescribed   Attend all scheduled provider appointments Call provider office for new concerns or questions  call the Suicide and Crisis Lifeline: 988 call the [place] National Suicide Prevention Lifeline: (408) 182-7466 or TTY: 276-393-0406 TTY 213-106-9710) to talk to a trained counselor call 1-800-273-TALK (toll free, 24 hour hotline) if experiencing a Mental Health or Behavioral Health Crisis  call office if I gain more than 2 pounds in one day or 5 pounds in one week track weight in diary use salt in moderation watch for swelling in feet, ankles and legs every day weigh myself daily develop a rescue plan follow rescue plan if symptoms flare-up eat more whole grains, fruits and vegetables, lean meats and healthy fats track symptoms and what helps feel better or worse dress right for the weather, hot or cold  Follow Up Plan: Telephone follow up appointment with care management team member scheduled for: 02-06-2023 at 1145 am          Plan:Telephone follow up appointment with care management team member scheduled for:  02-06-2023 at 145 pm  Alto Denver RN, MSN, CCM RN Care Manager  Chronic Care Management Direct Number: [phone]

## 2022-12-08 NOTE — Patient Instructions (Signed)
Please call the care guide team at [phone] if you need to cancel or reschedule your appointment.   If you are experiencing a Mental Health or Behavioral Health Crisis or need someone to talk to, please call the Suicide and Crisis Lifeline: 988 call the [place] National Suicide Prevention Lifeline: 530-086-2783 or TTY: 669-625-8880 TTY 917-708-2734) to talk to a trained counselor call 1-800-273-TALK (toll free, 24 hour hotline)   Following is a copy of the CCM Program Consent:  CCM service includes personalized support from designated clinical staff supervised by the physician, including individualized plan of care and coordination with other care providers 24/7 contact phone numbers for assistance for urgent and routine care needs. Service will only be billed when office clinical staff spend 20 minutes or more in a month to coordinate care. Only one practitioner may furnish and bill the service in a calendar month. The patient may stop CCM services at amy time (effective at the end of the month) by phone call to the office staff. The patient will be responsible for cost sharing (co-pay) or up to 20% of the service fee (after annual deductible is met)  Following is a copy of your full provider care plan:   Goals Addressed             This Visit's Progress    CCM Expected Outcome:  Monitor, Self-Manage and Reduce Symptoms of Afib       Current Barriers:  Knowledge Deficits related to medications that control AFIB Care Coordination needs related to cost effective medications to control AFIB and cost constraints in a patient with AFIB Chronic Disease Management support and education needs related to effective management of AFIB Financial Constraints.   Planned Interventions: Provider order and care plan reviewed. Collaborated with PharmD regarding patient care and plan. The patients granddaughter has talked with the pharm D and is checking on documents and other things needed to see if  the patient will qualify for assistance with Eliquis. Continues to work with the pharm D on a regular basis. Will continue to monitor for changes Counseled on increased risk of stroke due to Afib and benefits of anticoagulation for stroke prevention           Reviewed importance of adherence to anticoagulant exactly as prescribed. The patient is compliant with medications. The patients son states he is also working with the insurance agent on changing insurance providers to help with cost. Education on the medication of Gabapentin with the potential to cause drowsiness. The patient is having back pain and the son instructed on trying to increase the dose of Gabapentin to help with the "burning pain in his back", but monitoring for drowsiness, per the pcp instructions. The patients son verbalized understanding.  Advised patient to discuss changes in AFIB with provider. AFIB is stable at this time. Denies any new issues related to AFIB Counseled on bleeding risk associated with AFIB and importance of self-monitoring for signs/symptoms of bleeding Counseled on avoidance of NSAIDs due to increased bleeding risk with anticoagulants Counseled on importance of regular laboratory monitoring as prescribed. Has labs on a regular basis. No acute changes. Counseled on seeking medical attention after a head injury or if there is blood in the urine/stool Afib action plan reviewed The patient has upcoming appointment with the pharm D for medication cost effective measures and medications management. Continues to work with pharm D for medication reconciliation and education needs. Screening for signs and symptoms of depression related to chronic disease state Assessed  social determinant of health barriers  Symptom Management: Take medications as prescribed   Attend all scheduled provider appointments Call provider office for new concerns or questions  call the Suicide and Crisis Lifeline: 988 call the [place] National  Suicide Prevention Lifeline: 931-811-8412 or TTY: (973)136-9301 TTY 570-366-9139) to talk to a trained counselor call 1-800-273-TALK (toll free, 24 hour hotline) if experiencing a Mental Health or Behavioral Health Crisis  - make a plan to exercise regularly - make a plan to eat healthy - keep all lab appointments - take medicine as prescribed  Follow Up Plan: Telephone follow up appointment with care management team member scheduled for: 02-06-2023 at 1145 am       CCM Expected Outcome:  Monitor, Self-Manage and Reduce Symptoms of Heart Failure       Current Barriers:  Knowledge Deficits related to parameters to take Lasix, monitoring for changes in heart failure and plan of care for heart failure Care Coordination needs related to resources and education needs for managing heart failure in a patient with heart failure Chronic Disease Management support and education needs related to effective management of heart failure  Planned Interventions: Basic overview and discussion of pathophysiology of Heart Failure reviewed. The patient is doing well. His son states that he is stable and doing well. Denies any acute changes in his HF or heart health. Provided education on low sodium diet. Review of heart healthy diet. The patients son states overall the patient is doing well with his eating. He tries to monitor it but is proud that he is keeping his weight around 200. He feels this is a plus as when it gets to be around 208 to 210 then there are issues. Education on monitoring for hidden sodium in foods.  Reviewed Heart Failure Action Plan in depth and provided written copy. Will send information through my Chart Assessed need for readable accurate scales in home. Has scales in the home. Weighs daily, son assist. The patients weight is staying around 205 to 207. He had a cold last month and had some extra congestion. His son states that once he got over the cold his breathing was better and his  weight went down. Education and support given.  Provided education about placing scale on hard, flat surface Advised patient to weigh each morning after emptying bladder Discussed importance of daily weight and advised patient to weigh and record daily. The patients son states that the patient weighs daily. Weight today is 205. The son states if he can keep his weight around 200 this is good for the patient if it gets to 208 to 210 that is when the patient has trouble.  Education on calling the provider for 2/3 + weight gain in one day or 5 pounds in one week. Education on weight being a key factor in helping with heart failure exacerbation. Reviewed role of diuretics in prevention of fluid overload and management of heart failure. Works with the pharm D on a regular basis for effective management of medications. Discussed the importance of keeping all appointments with provider. Education on the recommendation of getting an appointment for follow up with the cardiologist at [facility] Provided patient with education about the role of exercise in the management of heart failure Advised patient to discuss parameters of taking Lasix, questions and concerns about heart failure and effective management of heart failure with provider. The patients son states instead of giving Lasix every other day like he was he is now giving every day  and this seems to be working well for the patient. The patient has his portable oxygen now. The patients son states they had to go with a new DME provider but have this now and the patient is using as prescribed. The son feels like he has become dependent on the portable oxygen. EF 65%-(08-2021) explained what the EF% was and how heart failure can change and progress Screening for signs and symptoms of depression related to chronic disease state  Assessed social determinant of health barriers The patient is still having some problems with back pain and discomfort. He is using the pain  patches and icy hot. The son states the patient is sitting a lot and he encourages him to move around and get exercise. He said when he was taking the prednisone he was having some better relief. He states that seems to be the only real issue he is having right now. Will collaborate with the pcp for recommendations and will follow up accordingly with the patients son. Call made back to the son today after collaboration with the pcp. The patients son states that he does not think his dad has been taking the gabapentin until this week. Review of the recommendations of the pcp. The son wrote down the information and is going to check the bottle when he gets home. Instructed the son that the patient can take up to 300 mg at bedtime or if he is having moderate to severe pain in his back during the day he can take 100 mg in the am, 100 mg in the afternoon and 300 mg at bedtime.  The patients son will try this. Also instructed that we could order more prednisone but would not want to repeat this too often. He will try the gabapentin first and get back with the [facility] after trying as the pcp has recommended. No other needs expressed at this time. Will continue to monitor and work with the patient and the patients son accordingly.   Symptom Management: Take medications as prescribed   Attend all scheduled provider appointments Call provider office for new concerns or questions  call the Suicide and Crisis Lifeline: 988 call the [place] National Suicide Prevention Lifeline: 4751924231 or TTY: 463-428-0016 TTY (412)720-5476) to talk to a trained counselor call 1-800-273-TALK (toll free, 24 hour hotline) if experiencing a Mental Health or Behavioral Health Crisis  call office if I gain more than 2 pounds in one day or 5 pounds in one week track weight in diary use salt in moderation watch for swelling in feet, ankles and legs every day weigh myself daily develop a rescue plan follow rescue plan if symptoms  flare-up eat more whole grains, fruits and vegetables, lean meats and healthy fats track symptoms and what helps feel better or worse dress right for the weather, hot or cold  Follow Up Plan: Telephone follow up appointment with care management team member scheduled for: 02-06-2023 at 1145 am          Patient verbalizes understanding of instructions and care plan provided today and agrees to view in MyChart. Active MyChart status and patient understanding of how to access instructions and care plan via MyChart confirmed with patient.  Telephone follow up appointment with care management team member scheduled for: 02-06-2023 at 1145 am

## 2022-12-27 ENCOUNTER — Other Ambulatory Visit: Payer: Self-pay | Admitting: Family Medicine

## 2022-12-27 DIAGNOSIS — M62830 Muscle spasm of back: Secondary | ICD-10-CM

## 2022-12-27 DIAGNOSIS — F5101 Primary insomnia: Secondary | ICD-10-CM

## 2022-12-27 DIAGNOSIS — M5442 Lumbago with sciatica, left side: Secondary | ICD-10-CM

## 2022-12-27 MED ORDER — TRAZODONE HCL 50 MG PO TABS: 50.0000 mg | ORAL_TABLET | Freq: Every day | ORAL | 0 refills | Status: DC

## 2022-12-30 DIAGNOSIS — J90 Pleural effusion, not elsewhere classified: Secondary | ICD-10-CM | POA: Diagnosis not present

## 2022-12-30 DIAGNOSIS — Z7901 Long term (current) use of anticoagulants: Secondary | ICD-10-CM | POA: Diagnosis not present

## 2022-12-30 DIAGNOSIS — R590 Localized enlarged lymph nodes: Secondary | ICD-10-CM | POA: Diagnosis not present

## 2022-12-30 DIAGNOSIS — R591 Generalized enlarged lymph nodes: Secondary | ICD-10-CM | POA: Diagnosis not present

## 2022-12-30 DIAGNOSIS — Z9981 Dependence on supplemental oxygen: Secondary | ICD-10-CM | POA: Diagnosis not present

## 2022-12-30 DIAGNOSIS — C9110 Chronic lymphocytic leukemia of B-cell type not having achieved remission: Secondary | ICD-10-CM | POA: Diagnosis not present

## 2022-12-30 DIAGNOSIS — Z87891 Personal history of nicotine dependence: Secondary | ICD-10-CM | POA: Diagnosis not present

## 2022-12-30 DIAGNOSIS — N289 Disorder of kidney and ureter, unspecified: Secondary | ICD-10-CM | POA: Diagnosis not present

## 2022-12-30 DIAGNOSIS — Z79899 Other long term (current) drug therapy: Secondary | ICD-10-CM | POA: Diagnosis not present

## 2022-12-30 DIAGNOSIS — R0902 Hypoxemia: Secondary | ICD-10-CM | POA: Diagnosis not present

## 2022-12-30 DIAGNOSIS — K5900 Constipation, unspecified: Secondary | ICD-10-CM | POA: Diagnosis not present

## 2022-12-30 DIAGNOSIS — J9811 Atelectasis: Secondary | ICD-10-CM | POA: Diagnosis not present

## 2022-12-30 DIAGNOSIS — I4891 Unspecified atrial fibrillation: Secondary | ICD-10-CM | POA: Diagnosis not present

## 2022-12-30 DIAGNOSIS — E278 Other specified disorders of adrenal gland: Secondary | ICD-10-CM | POA: Diagnosis not present

## 2022-12-30 DIAGNOSIS — R600 Localized edema: Secondary | ICD-10-CM | POA: Diagnosis not present

## 2022-12-30 DIAGNOSIS — E279 Disorder of adrenal gland, unspecified: Secondary | ICD-10-CM | POA: Diagnosis not present

## 2022-12-31 DIAGNOSIS — J84112 Idiopathic pulmonary fibrosis: Secondary | ICD-10-CM | POA: Diagnosis not present

## 2022-12-31 DIAGNOSIS — J9611 Chronic respiratory failure with hypoxia: Secondary | ICD-10-CM | POA: Diagnosis not present

## 2023-01-01 DIAGNOSIS — K5900 Constipation, unspecified: Secondary | ICD-10-CM | POA: Diagnosis not present

## 2023-01-04 ENCOUNTER — Ambulatory Visit (INDEPENDENT_AMBULATORY_CARE_PROVIDER_SITE_OTHER): Payer: PPO | Admitting: Family Medicine

## 2023-01-04 ENCOUNTER — Encounter: Payer: Self-pay | Admitting: Family Medicine

## 2023-01-04 VITALS — BP 128/78 | HR 72 | Ht 67.0 in | Wt 204.0 lb

## 2023-01-04 DIAGNOSIS — J310 Chronic rhinitis: Secondary | ICD-10-CM | POA: Diagnosis not present

## 2023-01-04 DIAGNOSIS — C9110 Chronic lymphocytic leukemia of B-cell type not having achieved remission: Secondary | ICD-10-CM

## 2023-01-04 DIAGNOSIS — M5442 Lumbago with sciatica, left side: Secondary | ICD-10-CM | POA: Diagnosis not present

## 2023-01-04 DIAGNOSIS — G8929 Other chronic pain: Secondary | ICD-10-CM | POA: Diagnosis not present

## 2023-01-04 MED ORDER — TRAMADOL HCL 50 MG PO TABS
50.0000 mg | ORAL_TABLET | Freq: Four times a day (QID) | ORAL | 0 refills | Status: DC | PRN
Start: 2023-01-04 — End: 2023-01-31

## 2023-01-04 MED ORDER — GABAPENTIN 300 MG PO CAPS
300.0000 mg | ORAL_CAPSULE | Freq: Every day | ORAL | 3 refills | Status: DC
Start: 2023-01-04 — End: 2023-01-31

## 2023-01-04 MED ORDER — FLUTICASONE PROPIONATE 50 MCG/ACT NA SUSP: 2.0000 | Freq: Every day | NASAL | 1 refills | Status: DC

## 2023-01-04 NOTE — Progress Notes (Signed)
Subjective:    Patient ID: Robert [name], male    DOB: 07-21-1932, 89 y.o.   MRN: [id number]  Robert [name] is a 89 y.o. male presenting on 01/04/2023 for Hospitalization Follow-up   HPI  ED FOLLOW-UP VISIT  Hospital/Location: [facility] ED Date of ED Visit: 12/30/22  Reason for Presenting to ED: abdominal pain nausea constipation  FOLLOW-UP  - ED provider note and record have been reviewed - Patient presents today about 5 days after recent ED visit. Brief summary of recent course, presented to ED, testing in ED with CT imaging with multiple abnormal lymph nodes and nodule findings, treated with laxative and resolved w/ BM, pursue outpatient work up imaging, already has Oncology at [facility] and asked for further imaging to be done. . - Today reports overall has done well after discharge from ED. Symptoms of constipation abdominal pain have improved or resolved. Using miralax 1 cap daily mixed results. Asking about other medication options  - New medications on discharge: Miralax - Changes to current meds on discharge: none  Chronic Back Pain persistent Previuosly treated. Improved temporarily on Prednisone. No longer taking this taper has completed  CLL  Followed by [facility] Oncology Dr Barbette Merino Has apt in Aug, and family has already called to re-scheduled to July earliest apt   I have reviewed the discharge medication list, and have reconciled the current and discharge medications today.      01/04/2023   11:01 AM 10/13/2022    1:48 PM 06/22/2022   10:33 AM  Depression screen PHQ 2/9  Decreased Interest 0 0 0  Down, Depressed, Hopeless 0 0 0  PHQ - 2 Score 0 0 0  Altered sleeping  0   Tired, decreased energy  0   Change in appetite  0   Feeling bad or failure about yourself   0   Trouble concentrating  0   Moving slowly or fidgety/restless  0   Suicidal thoughts  0   PHQ-9 Score  0   Difficult doing work/chores  Not difficult at all     Social History    Tobacco Use   Smoking status: Former    Types: Cigarettes    Quit date: 1985    Years since quitting: 39.4   Smokeless tobacco: Former  Building services engineer Use: Never used  Substance Use Topics   Alcohol use: Never   Drug use: Never    Review of Systems Per HPI unless specifically indicated above     Objective:    BP 128/78   Pulse 72   Ht 5\' 7"  (1.702 m)   Wt 204 lb (92.5 kg)   SpO2 98% Comment: 1L of oxygen  BMI 31.95 kg/m   Wt Readings from Last 3 Encounters:  01/04/23 204 lb (92.5 kg)  10/13/22 204 lb 12.8 oz (92.9 kg)  07/06/22 198 lb (89.8 kg)    Physical Exam Vitals and nursing note reviewed.  Constitutional:      General: He is not in acute distress.    Appearance: He is well-developed. He is not diaphoretic.     Comments: Well-appearing, comfortable, cooperative  HENT:     Head: Normocephalic and atraumatic.  Eyes:     General:        Right eye: No discharge.        Left eye: No discharge.     Conjunctiva/sclera: Conjunctivae normal.  Neck:     Thyroid: No thyromegaly.  Cardiovascular:  Rate and Rhythm: Normal rate and regular rhythm.     Pulses: Normal pulses.     Heart sounds: Normal heart sounds. No murmur heard. Pulmonary:     Effort: Pulmonary effort is normal. No respiratory distress.     Breath sounds: Normal breath sounds. No wheezing or rales.     Comments: On supplemental oxygen Musculoskeletal:        General: Normal range of motion.     Cervical back: Normal range of motion and neck supple.     Comments: Low Back Inspection: Normal appearance, no spinal deformity, symmetrical. Palpation: No tenderness over spinous processes. LEFT > R paraspinal muscle spasm hypertonicity ROM: Full active ROM forward flex / back extension, rotation L/R without discomfort Special Testing: Seated SLR negative for radicular pain bilaterally  Strength: Bilateral hip flex/ext 5/5, knee flex/ext 5/5, ankle dorsiflex/plantarflex 5/5 Neurovascular:  intact distal sensation to light touch   Lymphadenopathy:     Cervical: No cervical adenopathy.  Skin:    General: Skin is warm and dry.     Findings: No erythema or rash.  Neurological:     Mental Status: He is alert and oriented to person, place, and time. Mental status is at baseline.  Psychiatric:        Behavior: Behavior normal.     Comments: Well groomed, good eye contact, normal speech and thoughts    I have personally reviewed the radiology report from 12/30/22 on CT Abd.  CT Abdomen Pelvis W Contrast  Anatomical Region Laterality Modality  Abdomen -- Computed Tomography  Pelvis -- --   Impression  -Enlarged retroperitoneal and pelvic lymph nodes, as well as possible splenomegaly. Although etiology is unclear, findings could reflect underlying malignancy. Given ill-defined soft tissue along the anterior aspect of the aorta, differential also includes inflammatory process such as retroperitoneal fibrosis. Recommend PET/CT for further evaluation.  - There is a 1.9 cm left adrenal nodule. This finding would also be better evaluated on PET/CT.  -Mild inflammatory changes are seen adjacent to the body/tail of the pancreas. Findings are nonspecific but could reflect pancreatitis in the appropriate clinical setting.  - Small amount of free fluid is seen in the pelvis, which is of indeterminate etiology.  - Indeterminate 1.0 cm right renal lesion. Consider dedicated CT renal protocol for further evaluation.  -Reticular opacities in the lung bases, which may reflect some degree of interstitial lung disease.  Results for orders placed or performed in visit on 08/23/21  BASIC METABOLIC PANEL WITH GFR  Result Value Ref Range   Glucose, Bld 88 65 - 139 mg/dL   BUN 42 (H) 7 - 25 mg/dL   Creat 1.61 (H) 0.96 - 1.22 mg/dL   eGFR 41 (L) > OR = 60 mL/min/1.28m2   BUN/Creatinine Ratio 26 (H) 6 - 22 (calc)   Sodium 139 135 - 146 mmol/L   Potassium 5.3 3.5 - 5.3 mmol/L   Chloride 105  98 - 110 mmol/L   CO2 29 20 - 32 mmol/L   Calcium 9.9 8.6 - 10.3 mg/dL  CBC with Differential/Platelet  Result Value Ref Range   WBC 15.9 (H) 3.8 - 10.8 Thousand/uL   RBC 5.43 4.20 - 5.80 Million/uL   Hemoglobin 13.3 13.2 - 17.1 g/dL   HCT 04.5 40.9 - 81.1 %   MCV 79.0 (L) 80.0 - 100.0 fL   MCH 24.5 (L) 27.0 - 33.0 pg   MCHC 31.0 (L) 32.0 - 36.0 g/dL   RDW 91.4 (H) 78.2 - 95.6 %  Platelets 204 140 - 400 Thousand/uL   MPV  7.5 - 12.5 fL   Neutro Abs 4,897 1,500 - 7,800 cells/uL   Lymphs Abs 8,888 (H) 850 - 3,900 cells/uL   Absolute Monocytes 1,717 (H) 200 - 950 cells/uL   Eosinophils Absolute 254 15 - 500 cells/uL   Basophils Absolute 143 0 - 200 cells/uL   Neutrophils Relative % 30.8 %   Total Lymphocyte 55.9 %   Monocytes Relative 10.8 %   Eosinophils Relative 1.6 %   Basophils Relative 0.9 %   Smear Review    Magnesium  Result Value Ref Range   Magnesium 2.1 1.5 - 2.5 mg/dL      Assessment & Plan:   Problem List Items Addressed This Visit     Chronic left-sided low back pain with left-sided sciatica - Primary   Relevant Medications   gabapentin (NEURONTIN) 300 MG capsule   traMADol (ULTRAM) 50 MG tablet   CLL (chronic lymphocytic leukemia) (HCC)   Relevant Medications   gabapentin (NEURONTIN) 300 MG capsule   traMADol (ULTRAM) 50 MG tablet   Other Visit Diagnoses     Chronic rhinitis       Relevant Medications   fluticasone (FLONASE) 50 MCG/ACT nasal spray        Back Pain Continue Tylenol, Lidocaine, heating pad Increase Gabapentin from 100mg  nightly to one capsule of the new higher dose 300mg  nightly. Added Tramadol as needed only for higher amount of pain. This can be taken 1-2 times per day max, only if needed, caution sedation.  Please contact Dr Barbette Merino office to communicate with the need for next imaging PET CT imaging. They can coordinate and schedule this.  Next apt is in July for them, hopefully they can arrange imaging sooner.  I will route  the chart to Dr Barbette Merino as well.  Allergic Rhinosinusitis Start nasal steroid Flonase 2 sprays in each nostril daily for 4-6 weeks, may repeat course seasonally or as needed  Constipation Discussed dosing on miralax, okay to inc dose from 1 cap to 2-3-4 per individual glass, can use inc doses AS NEEDED and then back to baseline or phase off if ned.    Meds ordered this encounter  Medications   gabapentin (NEURONTIN) 300 MG capsule    Sig: Take 1 capsule (300 mg total) by mouth at bedtime.    Dispense:  90 capsule    Refill:  3   traMADol (ULTRAM) 50 MG tablet    Sig: Take 1 tablet (50 mg total) by mouth every 6 (six) hours as needed.    Dispense:  20 tablet    Refill:  0   fluticasone (FLONASE) 50 MCG/ACT nasal spray    Sig: Place 2 sprays into both nostrils daily. Use for 4-6 weeks then stop and use seasonally or as needed.    Dispense:  48 g    Refill:  1      Follow up plan: Return in about 3 months (around 04/06/2023) for 3 month follow-up Onc updates, Back Pain.  Route to [place] for medication rec updates  Saralyn Pilar, DO [facility] 01/04/2023, 10:59 AM

## 2023-01-04 NOTE — Patient Instructions (Addendum)
Thank you for coming to the office today.  For back pain Increase Gabapentin from 100mg  nightly to one capsule of the new higher dose 300mg  nightly.  Added Tramadol as needed only for higher amount of pain. This can be taken 1-2 times per day max, only if needed, caution sedation.  Keep on Tylenol, Muscle rub, heating pad, massage.  Please contact Dr Barbette Merino office to communicate with the need for next imaging PET CT imaging. They can coordinate and schedule this.  Next apt is in July for them, hopefully they can arrange imaging sooner.  I will route the chart to Dr Barbette Merino as well.  Start nasal steroid Flonase 2 sprays in each nostril daily for 4-6 weeks, may repeat course seasonally or as needed   Please schedule a Follow-up Appointment to: Return in about 3 months (around 04/06/2023) for 3 month follow-up Onc updates, Back Pain.  If you have any other questions or concerns, please feel free to call the office or send a message through MyChart. You may also schedule an earlier appointment if necessary.  Additionally, you may be receiving a survey about your experience at our office within a few days to 1 week by e-mail or mail. We value your feedback.  Saralyn Pilar, DO [facility], [place]

## 2023-01-05 DIAGNOSIS — I4821 Permanent atrial fibrillation: Secondary | ICD-10-CM

## 2023-01-05 DIAGNOSIS — I5032 Chronic diastolic (congestive) heart failure: Secondary | ICD-10-CM

## 2023-01-13 ENCOUNTER — Other Ambulatory Visit: Payer: Self-pay | Admitting: Family Medicine

## 2023-01-13 DIAGNOSIS — I5033 Acute on chronic diastolic (congestive) heart failure: Secondary | ICD-10-CM | POA: Diagnosis not present

## 2023-01-13 DIAGNOSIS — R918 Other nonspecific abnormal finding of lung field: Secondary | ICD-10-CM | POA: Diagnosis not present

## 2023-01-13 DIAGNOSIS — E785 Hyperlipidemia, unspecified: Secondary | ICD-10-CM | POA: Diagnosis not present

## 2023-01-13 DIAGNOSIS — R630 Anorexia: Secondary | ICD-10-CM | POA: Diagnosis not present

## 2023-01-13 DIAGNOSIS — M109 Gout, unspecified: Secondary | ICD-10-CM | POA: Diagnosis not present

## 2023-01-13 DIAGNOSIS — B348 Other viral infections of unspecified site: Secondary | ICD-10-CM | POA: Diagnosis not present

## 2023-01-13 DIAGNOSIS — Z20822 Contact with and (suspected) exposure to covid-19: Secondary | ICD-10-CM | POA: Diagnosis not present

## 2023-01-13 DIAGNOSIS — I5032 Chronic diastolic (congestive) heart failure: Secondary | ICD-10-CM

## 2023-01-13 DIAGNOSIS — I4891 Unspecified atrial fibrillation: Secondary | ICD-10-CM | POA: Diagnosis not present

## 2023-01-13 DIAGNOSIS — K5909 Other constipation: Secondary | ICD-10-CM | POA: Diagnosis not present

## 2023-01-13 DIAGNOSIS — Z9981 Dependence on supplemental oxygen: Secondary | ICD-10-CM | POA: Diagnosis not present

## 2023-01-13 DIAGNOSIS — Z66 Do not resuscitate: Secondary | ICD-10-CM | POA: Diagnosis not present

## 2023-01-13 DIAGNOSIS — N183 Chronic kidney disease, stage 3 unspecified: Secondary | ICD-10-CM | POA: Diagnosis not present

## 2023-01-13 DIAGNOSIS — R059 Cough, unspecified: Secondary | ICD-10-CM | POA: Diagnosis not present

## 2023-01-13 DIAGNOSIS — R768 Other specified abnormal immunological findings in serum: Secondary | ICD-10-CM | POA: Diagnosis not present

## 2023-01-13 DIAGNOSIS — I13 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 failure and stage 1 through stage 4 chronic kidney disease, or unspecified chronic kidney disease: Secondary | ICD-10-CM | POA: Diagnosis not present

## 2023-01-13 DIAGNOSIS — M1A9XX0 Chronic gout, unspecified, without tophus (tophi): Secondary | ICD-10-CM

## 2023-01-13 DIAGNOSIS — D8489 Other immunodeficiencies: Secondary | ICD-10-CM | POA: Diagnosis not present

## 2023-01-13 DIAGNOSIS — Z602 Problems related to living alone: Secondary | ICD-10-CM | POA: Diagnosis not present

## 2023-01-13 DIAGNOSIS — Z87891 Personal history of nicotine dependence: Secondary | ICD-10-CM | POA: Diagnosis not present

## 2023-01-13 DIAGNOSIS — R531 Weakness: Secondary | ICD-10-CM | POA: Diagnosis not present

## 2023-01-14 DIAGNOSIS — R059 Cough, unspecified: Secondary | ICD-10-CM | POA: Diagnosis not present

## 2023-01-15 DIAGNOSIS — Z136 Encounter for screening for cardiovascular disorders: Secondary | ICD-10-CM | POA: Diagnosis not present

## 2023-01-15 NOTE — Telephone Encounter (Signed)
Requested medication (s) are due for refill today: Yes  Requested medication (s) are on the active medication list: Yes  Last refill:  01/17/22  Future visit scheduled: Yes  Notes to clinic:  Protocol indicates pt. Needs lab work.    Requested Prescriptions  Pending Prescriptions Disp Refills   allopurinol (ZYLOPRIM) 100 MG tablet [Pharmacy Med Name: ALLOPURINOL 100 MG TABLET] 90 tablet 0    Sig: Take 1 tablet (100 mg total) by mouth daily.     Endocrinology:  Gout Agents - allopurinol Failed - 01/13/2023  8:40 AM      Failed - Uric Acid in normal range and within 360 days    Uric Acid, Serum  Date Value Ref Range Status  03/23/2020 9.8 (H) 4.0 - 8.0 mg/dL Final    Comment:    Therapeutic target for gout patients: <6.0 mg/dL .          Failed - Cr in normal range and within 360 days    Creat  Date Value Ref Range Status  08/23/2021 1.63 (H) 0.70 - 1.22 mg/dL Final         Failed - CBC within normal limits and completed in the last 12 months    WBC  Date Value Ref Range Status  08/23/2021 15.9 (H) 3.8 - 10.8 Thousand/uL Final   RBC  Date Value Ref Range Status  08/23/2021 5.43 4.20 - 5.80 Million/uL Final   Hemoglobin  Date Value Ref Range Status  08/23/2021 13.3 13.2 - 17.1 g/dL Final   HCT  Date Value Ref Range Status  08/23/2021 42.9 38.5 - 50.0 % Final   MCHC  Date Value Ref Range Status  08/23/2021 31.0 (L) 32.0 - 36.0 g/dL Final   [facility]  Date Value Ref Range Status  08/23/2021 24.5 (L) 27.0 - 33.0 pg Final   MCV  Date Value Ref Range Status  08/23/2021 79.0 (L) 80.0 - 100.0 fL Final   No results found for: "PLTCOUNTKUC", "LABPLAT", "POCPLA" RDW  Date Value Ref Range Status  08/23/2021 17.1 (H) 11.0 - 15.0 % Final         Passed - Valid encounter within last 12 months    Recent Outpatient Visits           1 week ago Chronic left-sided low back pain with left-sided sciatica   [facility] Smitty Cords, DO    3 months ago Acute left-sided low back pain with left-sided sciatica   [place] [facility] Smitty Cords, DO   6 months ago Primary osteoarthritis of right knee   [facility] [place], Netta Neat, DO   7 months ago Chronic heart failure with preserved ejection fraction [facility])   [facility] Smitty Cords, DO   10 months ago Primary osteoarthritis of right knee   [name] [facility] Smitty Cords, DO       Future Appointments             In 2 months Althea Charon, Netta Neat, DO [place] [facility]             furosemide (LASIX) 20 MG tablet [Pharmacy Med Name: FUROSEMIDE 20 MG TABLET] 30 tablet 0    Sig: Take 1 tablet (20 mg total) by mouth daily as needed for fluid or edema.     Cardiovascular:  Diuretics - Loop Failed - 01/13/2023  8:40 AM      Failed - K in normal range and within 180 days    Potassium  Date Value Ref Range Status  08/23/2021 5.3 3.5 - 5.3 mmol/L Final         Failed - Ca in normal range and within 180 days    Calcium  Date Value Ref Range Status  08/23/2021 9.9 8.6 - 10.3 mg/dL Final         Failed - Na in normal range and within 180 days    Sodium  Date Value Ref Range Status  08/23/2021 139 135 - 146 mmol/L Final         Failed - Cr in normal range and within 180 days    Creat  Date Value Ref Range Status  08/23/2021 1.63 (H) 0.70 - 1.22 mg/dL Final         Failed - Cl in normal range and within 180 days    Chloride  Date Value Ref Range Status  08/23/2021 105 98 - 110 mmol/L Final         Failed - Mg Level in normal range and within 180 days    Magnesium  Date Value Ref Range Status  08/23/2021 2.1 1.5 - 2.5 mg/dL Final         Passed - Last BP in normal range    BP Readings from Last 1 Encounters:  01/04/23 128/78         Passed - Valid encounter within last 6 months     Recent Outpatient Visits           1 week ago Chronic left-sided low back pain with left-sided sciatica   [place] [facility] Smitty Cords, DO   3 months ago Acute left-sided low back pain with left-sided sciatica   [place] [facility] Smitty Cords, DO   6 months ago Primary osteoarthritis of right knee   [place] [facility] Smitty Cords, DO   7 months ago Chronic heart failure with preserved ejection fraction [facility])   [place] [facility] Smitty Cords, DO   10 months ago Primary osteoarthritis of right knee   [place] [facility] Smitty Cords, DO       Future Appointments             In 2 months Althea Charon, Netta Neat, DO [place] [facility], [facility]

## 2023-01-17 ENCOUNTER — Ambulatory Visit (INDEPENDENT_AMBULATORY_CARE_PROVIDER_SITE_OTHER): Payer: PPO

## 2023-01-17 DIAGNOSIS — I5032 Chronic diastolic (congestive) heart failure: Secondary | ICD-10-CM

## 2023-01-17 DIAGNOSIS — I4821 Permanent atrial fibrillation: Secondary | ICD-10-CM

## 2023-01-17 NOTE — Transitions of Care (Post Inpatient/ED Visit) (Cosign Needed)
01/17/2023  Name: Robert [name] MRN: [id number] DOB: [birth year]  Today's TOC FU Call Status: Today's TOC FU Call Status:: Successful TOC FU Call Competed TOC FU Call Complete Date: 01/17/23  Transition Care Management Follow-up Telephone Call Date of Discharge: 01/13/23 Discharge Facility: Other [facility]) Name of Other (Non[facility]) Discharge Facility: [facility] [place] Type of Discharge: Emergency Department Reason for ED Visit: Other: (cold due low immune system) How have you been since you were released from the hospital?: Better Any questions or concerns?: No  Items Reviewed: Did you receive and understand the discharge instructions provided?: Yes Medications obtained,verified, and reconciled?: Yes (Medications Reviewed) Any new allergies since your discharge?: No Dietary orders reviewed?: NA Do you have support at home?: Yes People in Home: child(ren), adult, sibling(s) Name of Support/Comfort Primary Source: Khayman and daughters and Tyquavious's niece  Medications Reviewed Today: Medications Reviewed Today     Reviewed by Smitty Cords, DO (Physician) on 01/04/23 at 1741  Med List Status: <None>   Medication Order Taking? Sig Documenting Provider Last Dose Status Informant  acetaminophen (TYLENOL) 500 MG tablet [id number] Yes Take 500 mg by mouth 2 (two) times daily as needed. [provider] Taking Active   allopurinol (ZYLOPRIM) 100 MG tablet [id number] Yes Take 1 tablet (100 mg total) by mouth daily. Smitty Cords, DO Taking Active   apixaban (ELIQUIS) 2.5 MG TABS tablet [id number] Yes Take 1 tablet (2.5 mg total) by mouth 2 (two) times daily. Smitty Cords, DO Taking Active   diclofenac Sodium (VOLTAREN) 1 % GEL [id number] Yes Apply 2 g topically 4 (four) times daily as needed (right knee arthritis pain). Smitty Cords, DO Taking Active   fluticasone (FLONASE) 50 MCG/ACT nasal spray [id number]  Place 2 sprays into both  nostrils daily. Use for 4-6 weeks then stop and use seasonally or as needed. Karamalegos, Netta Neat, DO  Active   furosemide (LASIX) 20 MG tablet [id number] Yes Take 1 tablet (20 mg total) by mouth daily as needed for fluid or edema. Smitty Cords, DO Taking Active   gabapentin (NEURONTIN) 300 MG capsule [id number] Yes Take 1 capsule (300 mg total) by mouth at bedtime. Karamalegos, Netta Neat, DO  Active   traMADol (ULTRAM) 50 MG tablet [id number] Yes Take 1 tablet (50 mg total) by mouth every 6 (six) hours as needed. Smitty Cords, DO  Active   traZODone (DESYREL) 50 MG tablet [id number] Yes Take 1 tablet (50 mg total) by mouth at bedtime. Lorre Munroe, NP Taking Active   triamcinolone cream (KENALOG) 0.5 % [id number] Yes Apply 1 application topically 2 (two) times daily. To affected areas, for up to 2 weeks. Smitty Cords, DO Taking Active             Home Care and Equipment/Supplies: Were Home Health Services Ordered?: No Any new equipment or medical supplies ordered?: No  Functional Questionnaire: Do you need assistance with bathing/showering or dressing?: No Do you need assistance with meal preparation?: Yes (son fixes meals) Do you need assistance with eating?: No Do you have difficulty maintaining continence: No Do you need assistance with getting out of bed/getting out of a chair/moving?: No Do you have difficulty managing or taking your medications?: Yes (son assist)  Follow up appointments reviewed: PCP Follow-up appointment confirmed?: No (Roderic states his niece is getting appointments scheduled) MD Provider Line Number:[phone] Given: Yes Specialist Hospital Follow-up appointment confirmed?: No Reason Specialist Follow-Up Not Confirmed: Patient has Specialist Provider Number and  will Call for Appointment Do you need transportation to your follow-up appointment?: No Do you understand care options if your condition(s) worsen?:  Yes-patient verbalized understanding    Alto Denver RN, MSN, CCM RN Care Manager  Chronic Care Management Direct Number: [phone]

## 2023-01-17 NOTE — Patient Instructions (Addendum)
Please call the care guide team at [phone] if you need to cancel or reschedule your appointment.   If you are experiencing a Mental Health or Behavioral Health Crisis or need someone to talk to, please call the Suicide and Crisis Lifeline: 988 call the [place] National Suicide Prevention Lifeline: 928-187-1032 or TTY: 304 718 2787 TTY [phone]) to talk to a trained counselor call 1-800-273-TALK (toll free, 24 hour hotline)   Following is a copy of the CCM Program Consent:  CCM service includes personalized support from designated clinical staff supervised by the physician, including individualized plan of care and coordination with other care providers 24/7 contact phone numbers for assistance for urgent and routine care needs. Service will only be billed when office clinical staff spend 20 minutes or more in a month to coordinate care. Only one practitioner may furnish and bill the service in a calendar month. The patient may stop CCM services at amy time (effective at the end of the month) by phone call to the office staff. The patient will be responsible for cost sharing (co-pay) or up to 20% of the service fee (after annual deductible is met)  oday's TOC FU Call Status: Today's TOC FU Call Status:: Successful TOC FU Call Competed TOC FU Call Complete Date: 01/17/23   Transition Care Management Follow-up Telephone Call Date of Discharge: 01/13/23 Discharge Facility: Other [facility]) Name of Other [facility] Discharge Facility: [facility] [place] Type of Discharge: Emergency Department Reason for ED Visit: Other: (cold due low immune system) How have you been since you were released from the hospital?: Better Any questions or concerns?: No   Items Reviewed: Did you receive and understand the discharge instructions provided?: Yes Medications obtained,verified, and reconciled?: Yes (Medications Reviewed) Any new allergies since your discharge?: No Dietary orders  reviewed?: NA Do you have support at home?: Yes People in Home: child(ren), adult, sibling(s) Name of Support/Comfort Primary Source: [place] and daughters and Colman's niece   Medications Reviewed Today: Medications Reviewed Today       Reviewed by Smitty Cords, DO (Physician) on 01/04/23 at 1741  Med List Status: <None>    Medication Order Taking? Sig Documenting Provider Last Dose Status Informant  acetaminophen (TYLENOL) 500 MG tablet [id number] Yes Take 500 mg by mouth 2 (two) times daily as needed. [provider] Taking Active    allopurinol (ZYLOPRIM) 100 MG tablet [id number] Yes Take 1 tablet (100 mg total) by mouth daily. Smitty Cords, DO Taking Active    apixaban (ELIQUIS) 2.5 MG TABS tablet [id number] Yes Take 1 tablet (2.5 mg total) by mouth 2 (two) times daily. Smitty Cords, DO Taking Active    diclofenac Sodium (VOLTAREN) 1 % GEL [id number] Yes Apply 2 g topically 4 (four) times daily as needed (right knee arthritis pain). Smitty Cords, DO Taking Active    fluticasone (FLONASE) 50 MCG/ACT nasal spray [id number]   Place 2 sprays into both nostrils daily. Use for 4-6 weeks then stop and use seasonally or as needed. Karamalegos, Netta Neat, DO   Active    furosemide (LASIX) 20 MG tablet [id number] Yes Take 1 tablet (20 mg total) by mouth daily as needed for fluid or edema. Smitty Cords, DO Taking Active    gabapentin (NEURONTIN) 300 MG capsule [id number] Yes Take 1 capsule (300 mg total) by mouth at bedtime. Smitty Cords, DO   Active    traMADol (ULTRAM) 50 MG tablet [id number] Yes Take 1 tablet (50 mg total) by mouth  every 6 (six) hours as needed. Smitty Cords, DO   Active    traZODone (DESYREL) 50 MG tablet [id number] Yes Take 1 tablet (50 mg total) by mouth at bedtime. Lorre Munroe, NP Taking Active    triamcinolone cream (KENALOG) 0.5 % [id number] Yes Apply 1 application topically 2 (two) times  daily. To affected areas, for up to 2 weeks. Smitty Cords, DO Taking Active                    Home Care and Equipment/Supplies: Were Home Health Services Ordered?: No Any new equipment or medical supplies ordered?: No   Functional Questionnaire: Do you need assistance with bathing/showering or dressing?: No Do you need assistance with meal preparation?: Yes (son fixes meals) Do you need assistance with eating?: No Do you have difficulty maintaining continence: No Do you need assistance with getting out of bed/getting out of a chair/moving?: No Do you have difficulty managing or taking your medications?: Yes (son assist)   Follow up appointments reviewed: PCP Follow-up appointment confirmed?: No (Anhad states his niece is getting appointments scheduled) MD Provider Line [phone] Given: Yes Specialist Hospital Follow-up appointment confirmed?: No Reason Specialist Follow-Up Not Confirmed: Patient has Specialist Provider Number and will Call for Appointment Do you need transportation to your follow-up appointment?: No Do you understand care options if your condition(s) worsen?: Yes-patient verbalized understanding       Alto Denver RN, MSN, CCM RN Care Manager  Chronic Care Management Direct Number: [phone]   Patient verbalizes understanding of instructions and care plan provided today and agrees to view in MyChart. Active MyChart status and patient understanding of how to access instructions and care plan via MyChart confirmed with patient.  Telephone follow up appointment with care management team member scheduled for: 02-06-2023

## 2023-01-19 DIAGNOSIS — J80 Acute respiratory distress syndrome: Secondary | ICD-10-CM | POA: Diagnosis not present

## 2023-01-19 DIAGNOSIS — R918 Other nonspecific abnormal finding of lung field: Secondary | ICD-10-CM | POA: Diagnosis not present

## 2023-01-19 DIAGNOSIS — F05 Delirium due to known physiological condition: Secondary | ICD-10-CM | POA: Diagnosis not present

## 2023-01-19 DIAGNOSIS — I13 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 failure and stage 1 through stage 4 chronic kidney disease, or unspecified chronic kidney disease: Secondary | ICD-10-CM | POA: Diagnosis not present

## 2023-01-19 DIAGNOSIS — B9562 Methicillin resistant Staphylococcus aureus infection as the cause of diseases classified elsewhere: Secondary | ICD-10-CM | POA: Diagnosis not present

## 2023-01-19 DIAGNOSIS — J90 Pleural effusion, not elsewhere classified: Secondary | ICD-10-CM | POA: Diagnosis not present

## 2023-01-19 DIAGNOSIS — D649 Anemia, unspecified: Secondary | ICD-10-CM | POA: Diagnosis not present

## 2023-01-19 DIAGNOSIS — E872 Acidosis, unspecified: Secondary | ICD-10-CM | POA: Diagnosis not present

## 2023-01-19 DIAGNOSIS — I4891 Unspecified atrial fibrillation: Secondary | ICD-10-CM | POA: Diagnosis not present

## 2023-01-19 DIAGNOSIS — Z87891 Personal history of nicotine dependence: Secondary | ICD-10-CM | POA: Diagnosis not present

## 2023-01-19 DIAGNOSIS — I447 Left bundle-branch block, unspecified: Secondary | ICD-10-CM | POA: Diagnosis not present

## 2023-01-19 DIAGNOSIS — I350 Nonrheumatic aortic (valve) stenosis: Secondary | ICD-10-CM | POA: Diagnosis not present

## 2023-01-19 DIAGNOSIS — N183 Chronic kidney disease, stage 3 unspecified: Secondary | ICD-10-CM | POA: Diagnosis not present

## 2023-01-19 DIAGNOSIS — R0902 Hypoxemia: Secondary | ICD-10-CM | POA: Diagnosis not present

## 2023-01-19 DIAGNOSIS — I082 Rheumatic disorders of both aortic and tricuspid valves: Secondary | ICD-10-CM | POA: Diagnosis not present

## 2023-01-19 DIAGNOSIS — I5030 Unspecified diastolic (congestive) heart failure: Secondary | ICD-10-CM | POA: Diagnosis not present

## 2023-01-19 DIAGNOSIS — J690 Pneumonitis due to inhalation of food and vomit: Secondary | ICD-10-CM | POA: Diagnosis not present

## 2023-01-19 DIAGNOSIS — J189 Pneumonia, unspecified organism: Secondary | ICD-10-CM | POA: Diagnosis not present

## 2023-01-19 DIAGNOSIS — I129 Hypertensive chronic kidney disease with stage 1 through stage 4 chronic kidney disease, or unspecified chronic kidney disease: Secondary | ICD-10-CM | POA: Diagnosis not present

## 2023-01-19 DIAGNOSIS — I959 Hypotension, unspecified: Secondary | ICD-10-CM | POA: Diagnosis not present

## 2023-01-19 DIAGNOSIS — R54 Age-related physical debility: Secondary | ICD-10-CM | POA: Diagnosis not present

## 2023-01-19 DIAGNOSIS — R0602 Shortness of breath: Secondary | ICD-10-CM | POA: Diagnosis not present

## 2023-01-19 DIAGNOSIS — I5032 Chronic diastolic (congestive) heart failure: Secondary | ICD-10-CM | POA: Diagnosis not present

## 2023-01-19 DIAGNOSIS — J1282 Pneumonia due to coronavirus disease 2019: Secondary | ICD-10-CM | POA: Diagnosis not present

## 2023-01-19 DIAGNOSIS — J9621 Acute and chronic respiratory failure with hypoxia: Secondary | ICD-10-CM | POA: Diagnosis not present

## 2023-01-19 DIAGNOSIS — D631 Anemia in chronic kidney disease: Secondary | ICD-10-CM | POA: Diagnosis not present

## 2023-01-19 DIAGNOSIS — I5033 Acute on chronic diastolic (congestive) heart failure: Secondary | ICD-10-CM | POA: Diagnosis not present

## 2023-01-19 DIAGNOSIS — D72829 Elevated white blood cell count, unspecified: Secondary | ICD-10-CM | POA: Diagnosis not present

## 2023-01-19 DIAGNOSIS — N184 Chronic kidney disease, stage 4 (severe): Secondary | ICD-10-CM | POA: Diagnosis not present

## 2023-01-19 DIAGNOSIS — M109 Gout, unspecified: Secondary | ICD-10-CM | POA: Diagnosis not present

## 2023-01-19 DIAGNOSIS — N189 Chronic kidney disease, unspecified: Secondary | ICD-10-CM | POA: Diagnosis not present

## 2023-01-19 DIAGNOSIS — I272 Pulmonary hypertension, unspecified: Secondary | ICD-10-CM | POA: Diagnosis not present

## 2023-01-19 DIAGNOSIS — B9789 Other viral agents as the cause of diseases classified elsewhere: Secondary | ICD-10-CM | POA: Diagnosis not present

## 2023-01-19 DIAGNOSIS — C9210 Chronic myeloid leukemia, BCR/ABL-positive, not having achieved remission: Secondary | ICD-10-CM | POA: Diagnosis not present

## 2023-01-19 DIAGNOSIS — Z515 Encounter for palliative care: Secondary | ICD-10-CM | POA: Diagnosis not present

## 2023-01-19 DIAGNOSIS — Z79899 Other long term (current) drug therapy: Secondary | ICD-10-CM | POA: Diagnosis not present

## 2023-01-19 DIAGNOSIS — Z7901 Long term (current) use of anticoagulants: Secondary | ICD-10-CM | POA: Diagnosis not present

## 2023-01-19 DIAGNOSIS — Z66 Do not resuscitate: Secondary | ICD-10-CM | POA: Diagnosis not present

## 2023-01-19 DIAGNOSIS — J8410 Pulmonary fibrosis, unspecified: Secondary | ICD-10-CM | POA: Diagnosis not present

## 2023-01-19 DIAGNOSIS — E785 Hyperlipidemia, unspecified: Secondary | ICD-10-CM | POA: Diagnosis not present

## 2023-01-19 DIAGNOSIS — Z791 Long term (current) use of non-steroidal anti-inflammatories (NSAID): Secondary | ICD-10-CM | POA: Diagnosis not present

## 2023-01-19 DIAGNOSIS — C9110 Chronic lymphocytic leukemia of B-cell type not having achieved remission: Secondary | ICD-10-CM | POA: Diagnosis not present

## 2023-01-19 DIAGNOSIS — Z8616 Personal history of COVID-19: Secondary | ICD-10-CM | POA: Diagnosis not present

## 2023-01-19 DIAGNOSIS — D509 Iron deficiency anemia, unspecified: Secondary | ICD-10-CM | POA: Diagnosis not present

## 2023-01-19 DIAGNOSIS — B348 Other viral infections of unspecified site: Secondary | ICD-10-CM | POA: Diagnosis not present

## 2023-01-19 DIAGNOSIS — J9601 Acute respiratory failure with hypoxia: Secondary | ICD-10-CM | POA: Diagnosis not present

## 2023-01-19 DIAGNOSIS — N179 Acute kidney failure, unspecified: Secondary | ICD-10-CM | POA: Diagnosis not present

## 2023-01-21 DIAGNOSIS — E8770 Fluid overload, unspecified: Secondary | ICD-10-CM | POA: Diagnosis not present

## 2023-01-21 DIAGNOSIS — J189 Pneumonia, unspecified organism: Secondary | ICD-10-CM | POA: Diagnosis not present

## 2023-01-21 DIAGNOSIS — J849 Interstitial pulmonary disease, unspecified: Secondary | ICD-10-CM | POA: Diagnosis not present

## 2023-01-21 DIAGNOSIS — D631 Anemia in chronic kidney disease: Secondary | ICD-10-CM | POA: Diagnosis not present

## 2023-01-21 DIAGNOSIS — N189 Chronic kidney disease, unspecified: Secondary | ICD-10-CM | POA: Diagnosis not present

## 2023-01-21 DIAGNOSIS — E8779 Other fluid overload: Secondary | ICD-10-CM | POA: Diagnosis not present

## 2023-01-21 DIAGNOSIS — Z9981 Dependence on supplemental oxygen: Secondary | ICD-10-CM | POA: Diagnosis not present

## 2023-01-21 DIAGNOSIS — Z66 Do not resuscitate: Secondary | ICD-10-CM | POA: Diagnosis not present

## 2023-01-21 DIAGNOSIS — F419 Anxiety disorder, unspecified: Secondary | ICD-10-CM | POA: Diagnosis not present

## 2023-01-21 DIAGNOSIS — R0600 Dyspnea, unspecified: Secondary | ICD-10-CM | POA: Diagnosis not present

## 2023-01-21 DIAGNOSIS — R918 Other nonspecific abnormal finding of lung field: Secondary | ICD-10-CM | POA: Diagnosis not present

## 2023-01-21 DIAGNOSIS — J8410 Pulmonary fibrosis, unspecified: Secondary | ICD-10-CM | POA: Diagnosis not present

## 2023-01-21 DIAGNOSIS — Z87891 Personal history of nicotine dependence: Secondary | ICD-10-CM | POA: Diagnosis not present

## 2023-01-21 DIAGNOSIS — I447 Left bundle-branch block, unspecified: Secondary | ICD-10-CM | POA: Diagnosis not present

## 2023-01-21 DIAGNOSIS — R509 Fever, unspecified: Secondary | ICD-10-CM | POA: Diagnosis not present

## 2023-01-21 DIAGNOSIS — I5033 Acute on chronic diastolic (congestive) heart failure: Secondary | ICD-10-CM | POA: Diagnosis not present

## 2023-01-21 DIAGNOSIS — I50810 Right heart failure, unspecified: Secondary | ICD-10-CM | POA: Diagnosis not present

## 2023-01-21 DIAGNOSIS — R54 Age-related physical debility: Secondary | ICD-10-CM | POA: Diagnosis not present

## 2023-01-21 DIAGNOSIS — M109 Gout, unspecified: Secondary | ICD-10-CM | POA: Diagnosis not present

## 2023-01-21 DIAGNOSIS — J1282 Pneumonia due to coronavirus disease 2019: Secondary | ICD-10-CM | POA: Diagnosis not present

## 2023-01-21 DIAGNOSIS — B348 Other viral infections of unspecified site: Secondary | ICD-10-CM | POA: Diagnosis not present

## 2023-01-21 DIAGNOSIS — J9621 Acute and chronic respiratory failure with hypoxia: Secondary | ICD-10-CM | POA: Diagnosis not present

## 2023-01-21 DIAGNOSIS — I13 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 failure and stage 1 through stage 4 chronic kidney disease, or unspecified chronic kidney disease: Secondary | ICD-10-CM | POA: Diagnosis not present

## 2023-01-21 DIAGNOSIS — N183 Chronic kidney disease, stage 3 unspecified: Secondary | ICD-10-CM | POA: Diagnosis not present

## 2023-01-21 DIAGNOSIS — I959 Hypotension, unspecified: Secondary | ICD-10-CM | POA: Diagnosis not present

## 2023-01-21 DIAGNOSIS — F05 Delirium due to known physiological condition: Secondary | ICD-10-CM | POA: Diagnosis not present

## 2023-01-21 DIAGNOSIS — Z515 Encounter for palliative care: Secondary | ICD-10-CM | POA: Diagnosis not present

## 2023-01-21 DIAGNOSIS — I082 Rheumatic disorders of both aortic and tricuspid valves: Secondary | ICD-10-CM | POA: Diagnosis not present

## 2023-01-21 DIAGNOSIS — N179 Acute kidney failure, unspecified: Secondary | ICD-10-CM | POA: Diagnosis not present

## 2023-01-21 DIAGNOSIS — R110 Nausea: Secondary | ICD-10-CM | POA: Diagnosis not present

## 2023-01-21 DIAGNOSIS — D509 Iron deficiency anemia, unspecified: Secondary | ICD-10-CM | POA: Diagnosis not present

## 2023-01-21 DIAGNOSIS — J690 Pneumonitis due to inhalation of food and vomit: Secondary | ICD-10-CM | POA: Diagnosis not present

## 2023-01-21 DIAGNOSIS — U071 COVID-19: Secondary | ICD-10-CM | POA: Diagnosis not present

## 2023-01-21 DIAGNOSIS — I4891 Unspecified atrial fibrillation: Secondary | ICD-10-CM | POA: Diagnosis not present

## 2023-01-21 DIAGNOSIS — C9110 Chronic lymphocytic leukemia of B-cell type not having achieved remission: Secondary | ICD-10-CM | POA: Diagnosis not present

## 2023-01-21 DIAGNOSIS — I5030 Unspecified diastolic (congestive) heart failure: Secondary | ICD-10-CM | POA: Diagnosis not present

## 2023-01-21 DIAGNOSIS — I272 Pulmonary hypertension, unspecified: Secondary | ICD-10-CM | POA: Diagnosis not present

## 2023-01-21 DIAGNOSIS — J9601 Acute respiratory failure with hypoxia: Secondary | ICD-10-CM | POA: Diagnosis not present

## 2023-01-21 DIAGNOSIS — D6489 Other specified anemias: Secondary | ICD-10-CM | POA: Diagnosis not present

## 2023-01-21 DIAGNOSIS — Z79899 Other long term (current) drug therapy: Secondary | ICD-10-CM | POA: Diagnosis not present

## 2023-01-21 DIAGNOSIS — Z7901 Long term (current) use of anticoagulants: Secondary | ICD-10-CM | POA: Diagnosis not present

## 2023-01-22 DIAGNOSIS — R54 Age-related physical debility: Secondary | ICD-10-CM | POA: Diagnosis not present

## 2023-01-22 DIAGNOSIS — C9110 Chronic lymphocytic leukemia of B-cell type not having achieved remission: Secondary | ICD-10-CM | POA: Diagnosis not present

## 2023-01-22 DIAGNOSIS — B348 Other viral infections of unspecified site: Secondary | ICD-10-CM | POA: Diagnosis not present

## 2023-01-22 DIAGNOSIS — U071 COVID-19: Secondary | ICD-10-CM | POA: Diagnosis not present

## 2023-01-22 DIAGNOSIS — I272 Pulmonary hypertension, unspecified: Secondary | ICD-10-CM | POA: Diagnosis not present

## 2023-01-22 DIAGNOSIS — J189 Pneumonia, unspecified organism: Secondary | ICD-10-CM | POA: Diagnosis not present

## 2023-01-22 DIAGNOSIS — J9621 Acute and chronic respiratory failure with hypoxia: Secondary | ICD-10-CM | POA: Diagnosis not present

## 2023-01-22 DIAGNOSIS — J690 Pneumonitis due to inhalation of food and vomit: Secondary | ICD-10-CM | POA: Diagnosis not present

## 2023-01-22 DIAGNOSIS — D6489 Other specified anemias: Secondary | ICD-10-CM | POA: Diagnosis not present

## 2023-01-22 DIAGNOSIS — E8770 Fluid overload, unspecified: Secondary | ICD-10-CM | POA: Diagnosis not present

## 2023-01-22 DIAGNOSIS — J9601 Acute respiratory failure with hypoxia: Secondary | ICD-10-CM | POA: Diagnosis not present

## 2023-01-22 DIAGNOSIS — I5030 Unspecified diastolic (congestive) heart failure: Secondary | ICD-10-CM | POA: Diagnosis not present

## 2023-01-22 DIAGNOSIS — J8410 Pulmonary fibrosis, unspecified: Secondary | ICD-10-CM | POA: Diagnosis not present

## 2023-01-22 DIAGNOSIS — I082 Rheumatic disorders of both aortic and tricuspid valves: Secondary | ICD-10-CM | POA: Diagnosis not present

## 2023-01-23 DIAGNOSIS — I272 Pulmonary hypertension, unspecified: Secondary | ICD-10-CM | POA: Diagnosis not present

## 2023-01-23 DIAGNOSIS — U071 COVID-19: Secondary | ICD-10-CM | POA: Diagnosis not present

## 2023-01-23 DIAGNOSIS — B348 Other viral infections of unspecified site: Secondary | ICD-10-CM | POA: Diagnosis not present

## 2023-01-23 DIAGNOSIS — J9601 Acute respiratory failure with hypoxia: Secondary | ICD-10-CM | POA: Diagnosis not present

## 2023-01-23 DIAGNOSIS — J849 Interstitial pulmonary disease, unspecified: Secondary | ICD-10-CM | POA: Diagnosis not present

## 2023-01-23 DIAGNOSIS — I959 Hypotension, unspecified: Secondary | ICD-10-CM | POA: Diagnosis not present

## 2023-01-23 DIAGNOSIS — N179 Acute kidney failure, unspecified: Secondary | ICD-10-CM | POA: Diagnosis not present

## 2023-01-23 DIAGNOSIS — C9110 Chronic lymphocytic leukemia of B-cell type not having achieved remission: Secondary | ICD-10-CM | POA: Diagnosis not present

## 2023-01-23 DIAGNOSIS — N183 Chronic kidney disease, stage 3 unspecified: Secondary | ICD-10-CM | POA: Diagnosis not present

## 2023-01-24 DIAGNOSIS — J9601 Acute respiratory failure with hypoxia: Secondary | ICD-10-CM | POA: Diagnosis not present

## 2023-01-24 DIAGNOSIS — N183 Chronic kidney disease, stage 3 unspecified: Secondary | ICD-10-CM | POA: Diagnosis not present

## 2023-01-24 DIAGNOSIS — J9621 Acute and chronic respiratory failure with hypoxia: Secondary | ICD-10-CM | POA: Diagnosis not present

## 2023-01-24 DIAGNOSIS — U071 COVID-19: Secondary | ICD-10-CM | POA: Diagnosis not present

## 2023-01-24 DIAGNOSIS — I959 Hypotension, unspecified: Secondary | ICD-10-CM | POA: Diagnosis not present

## 2023-01-24 DIAGNOSIS — J849 Interstitial pulmonary disease, unspecified: Secondary | ICD-10-CM | POA: Diagnosis not present

## 2023-01-24 DIAGNOSIS — R0600 Dyspnea, unspecified: Secondary | ICD-10-CM | POA: Diagnosis not present

## 2023-01-24 DIAGNOSIS — C9110 Chronic lymphocytic leukemia of B-cell type not having achieved remission: Secondary | ICD-10-CM | POA: Diagnosis not present

## 2023-01-24 DIAGNOSIS — R918 Other nonspecific abnormal finding of lung field: Secondary | ICD-10-CM | POA: Diagnosis not present

## 2023-01-24 DIAGNOSIS — E8770 Fluid overload, unspecified: Secondary | ICD-10-CM | POA: Diagnosis not present

## 2023-01-24 DIAGNOSIS — I272 Pulmonary hypertension, unspecified: Secondary | ICD-10-CM | POA: Diagnosis not present

## 2023-01-24 DIAGNOSIS — B348 Other viral infections of unspecified site: Secondary | ICD-10-CM | POA: Diagnosis not present

## 2023-01-25 DIAGNOSIS — E8779 Other fluid overload: Secondary | ICD-10-CM | POA: Diagnosis not present

## 2023-01-25 DIAGNOSIS — I959 Hypotension, unspecified: Secondary | ICD-10-CM | POA: Diagnosis not present

## 2023-01-25 DIAGNOSIS — J189 Pneumonia, unspecified organism: Secondary | ICD-10-CM | POA: Diagnosis not present

## 2023-01-25 DIAGNOSIS — N179 Acute kidney failure, unspecified: Secondary | ICD-10-CM | POA: Diagnosis not present

## 2023-01-25 DIAGNOSIS — Z79899 Other long term (current) drug therapy: Secondary | ICD-10-CM | POA: Diagnosis not present

## 2023-01-25 DIAGNOSIS — F05 Delirium due to known physiological condition: Secondary | ICD-10-CM | POA: Diagnosis not present

## 2023-01-25 DIAGNOSIS — C9110 Chronic lymphocytic leukemia of B-cell type not having achieved remission: Secondary | ICD-10-CM | POA: Diagnosis not present

## 2023-01-25 DIAGNOSIS — N189 Chronic kidney disease, unspecified: Secondary | ICD-10-CM | POA: Diagnosis not present

## 2023-01-25 DIAGNOSIS — M109 Gout, unspecified: Secondary | ICD-10-CM | POA: Diagnosis not present

## 2023-01-25 DIAGNOSIS — I4891 Unspecified atrial fibrillation: Secondary | ICD-10-CM | POA: Diagnosis not present

## 2023-01-25 DIAGNOSIS — J9601 Acute respiratory failure with hypoxia: Secondary | ICD-10-CM | POA: Diagnosis not present

## 2023-01-25 DIAGNOSIS — I272 Pulmonary hypertension, unspecified: Secondary | ICD-10-CM | POA: Diagnosis not present

## 2023-01-26 DIAGNOSIS — Z7901 Long term (current) use of anticoagulants: Secondary | ICD-10-CM | POA: Diagnosis not present

## 2023-01-26 DIAGNOSIS — N189 Chronic kidney disease, unspecified: Secondary | ICD-10-CM | POA: Diagnosis not present

## 2023-01-26 DIAGNOSIS — J9601 Acute respiratory failure with hypoxia: Secondary | ICD-10-CM | POA: Diagnosis not present

## 2023-01-26 DIAGNOSIS — C9110 Chronic lymphocytic leukemia of B-cell type not having achieved remission: Secondary | ICD-10-CM | POA: Diagnosis not present

## 2023-01-26 DIAGNOSIS — M109 Gout, unspecified: Secondary | ICD-10-CM | POA: Diagnosis not present

## 2023-01-26 DIAGNOSIS — F05 Delirium due to known physiological condition: Secondary | ICD-10-CM | POA: Diagnosis not present

## 2023-01-26 DIAGNOSIS — N179 Acute kidney failure, unspecified: Secondary | ICD-10-CM | POA: Diagnosis not present

## 2023-01-26 DIAGNOSIS — I272 Pulmonary hypertension, unspecified: Secondary | ICD-10-CM | POA: Diagnosis not present

## 2023-01-26 DIAGNOSIS — J189 Pneumonia, unspecified organism: Secondary | ICD-10-CM | POA: Diagnosis not present

## 2023-01-26 DIAGNOSIS — I4891 Unspecified atrial fibrillation: Secondary | ICD-10-CM | POA: Diagnosis not present

## 2023-01-26 DIAGNOSIS — I959 Hypotension, unspecified: Secondary | ICD-10-CM | POA: Diagnosis not present

## 2023-01-26 DIAGNOSIS — E8779 Other fluid overload: Secondary | ICD-10-CM | POA: Diagnosis not present

## 2023-01-27 DIAGNOSIS — R0600 Dyspnea, unspecified: Secondary | ICD-10-CM | POA: Diagnosis not present

## 2023-01-27 DIAGNOSIS — Z515 Encounter for palliative care: Secondary | ICD-10-CM | POA: Diagnosis not present

## 2023-01-27 DIAGNOSIS — R509 Fever, unspecified: Secondary | ICD-10-CM | POA: Diagnosis not present

## 2023-01-27 DIAGNOSIS — Z79899 Other long term (current) drug therapy: Secondary | ICD-10-CM | POA: Diagnosis not present

## 2023-01-27 DIAGNOSIS — F419 Anxiety disorder, unspecified: Secondary | ICD-10-CM | POA: Diagnosis not present

## 2023-01-27 DIAGNOSIS — R110 Nausea: Secondary | ICD-10-CM | POA: Diagnosis not present

## 2023-01-29 DIAGNOSIS — J9601 Acute respiratory failure with hypoxia: Secondary | ICD-10-CM | POA: Diagnosis not present

## 2023-01-29 DIAGNOSIS — Z79899 Other long term (current) drug therapy: Secondary | ICD-10-CM | POA: Diagnosis not present

## 2023-01-29 DIAGNOSIS — Z515 Encounter for palliative care: Secondary | ICD-10-CM | POA: Diagnosis not present

## 2023-01-29 DIAGNOSIS — R110 Nausea: Secondary | ICD-10-CM | POA: Diagnosis not present

## 2023-01-29 DIAGNOSIS — F419 Anxiety disorder, unspecified: Secondary | ICD-10-CM | POA: Diagnosis not present

## 2023-01-30 DIAGNOSIS — I4821 Permanent atrial fibrillation: Secondary | ICD-10-CM

## 2023-01-30 DIAGNOSIS — J189 Pneumonia, unspecified organism: Secondary | ICD-10-CM | POA: Diagnosis not present

## 2023-01-30 DIAGNOSIS — I5032 Chronic diastolic (congestive) heart failure: Secondary | ICD-10-CM

## 2023-01-30 DIAGNOSIS — N179 Acute kidney failure, unspecified: Secondary | ICD-10-CM | POA: Diagnosis not present

## 2023-01-30 DIAGNOSIS — J9621 Acute and chronic respiratory failure with hypoxia: Secondary | ICD-10-CM | POA: Diagnosis not present

## 2023-01-30 DIAGNOSIS — N183 Chronic kidney disease, stage 3 unspecified: Secondary | ICD-10-CM | POA: Diagnosis not present

## 2023-01-31 DIAGNOSIS — J84112 Idiopathic pulmonary fibrosis: Secondary | ICD-10-CM | POA: Diagnosis not present

## 2023-01-31 DIAGNOSIS — J9611 Chronic respiratory failure with hypoxia: Secondary | ICD-10-CM | POA: Diagnosis not present

## 2023-02-05 DEATH — deceased

## 2023-02-06 ENCOUNTER — Telehealth: Payer: PPO

## 2023-04-06 ENCOUNTER — Ambulatory Visit: Payer: PPO | Admitting: Family Medicine
